# Patient Record
Sex: Female | Born: 1955
Health system: Southern US, Community
[De-identification: ages and names within clinical notes are randomized; demographics above are authoritative.]

## PROBLEM LIST (undated history)

## (undated) DIAGNOSIS — K219 Gastro-esophageal reflux disease without esophagitis: Secondary | ICD-10-CM

## (undated) DIAGNOSIS — B029 Zoster without complications: Secondary | ICD-10-CM

## (undated) DIAGNOSIS — T7840XA Allergy, unspecified, initial encounter: Secondary | ICD-10-CM

## (undated) DIAGNOSIS — E785 Hyperlipidemia, unspecified: Secondary | ICD-10-CM

## (undated) DIAGNOSIS — I10 Essential (primary) hypertension: Secondary | ICD-10-CM

## (undated) DIAGNOSIS — M1990 Unspecified osteoarthritis, unspecified site: Secondary | ICD-10-CM

## (undated) DIAGNOSIS — K635 Polyp of colon: Secondary | ICD-10-CM

## (undated) HISTORY — DX: Allergy, unspecified, initial encounter: T78.40XA

## (undated) HISTORY — PX: ABDOMINAL HYSTERECTOMY: SHX81

## (undated) HISTORY — DX: Zoster without complications: B02.9

## (undated) HISTORY — DX: Gastro-esophageal reflux disease without esophagitis: K21.9

## (undated) HISTORY — PX: VULVECTOMY: SHX1086

## (undated) HISTORY — DX: Polyp of colon: K63.5

## (undated) HISTORY — DX: Hyperlipidemia, unspecified: E78.5

## (undated) HISTORY — PX: COLONOSCOPY: SHX174

## (undated) HISTORY — DX: Unspecified osteoarthritis, unspecified site: M19.90

## (undated) HISTORY — PX: POLYPECTOMY: SHX149

## (undated) HISTORY — DX: Essential (primary) hypertension: I10

## (undated) HISTORY — PX: LABIOPLASTY: SHX1900

---

## 1999-06-26 ENCOUNTER — Inpatient Hospital Stay (HOSPITAL_COMMUNITY): Admission: EM | Admit: 1999-06-26 | Discharge: 1999-06-27 | Payer: Self-pay | Admitting: Internal Medicine

## 1999-07-15 ENCOUNTER — Ambulatory Visit (HOSPITAL_COMMUNITY): Admission: RE | Admit: 1999-07-15 | Discharge: 1999-07-15 | Payer: Self-pay | Admitting: Internal Medicine

## 1999-07-15 ENCOUNTER — Encounter: Payer: Self-pay | Admitting: Internal Medicine

## 1999-10-26 ENCOUNTER — Other Ambulatory Visit: Admission: RE | Admit: 1999-10-26 | Discharge: 1999-10-26 | Payer: Self-pay | Admitting: *Deleted

## 2000-06-23 ENCOUNTER — Emergency Department (HOSPITAL_COMMUNITY): Admission: EM | Admit: 2000-06-23 | Discharge: 2000-06-23 | Payer: Self-pay | Admitting: Emergency Medicine

## 2000-06-23 ENCOUNTER — Encounter: Payer: Self-pay | Admitting: Emergency Medicine

## 2000-10-25 ENCOUNTER — Other Ambulatory Visit: Admission: RE | Admit: 2000-10-25 | Discharge: 2000-10-25 | Payer: Self-pay | Admitting: *Deleted

## 2001-10-30 ENCOUNTER — Other Ambulatory Visit: Admission: RE | Admit: 2001-10-30 | Discharge: 2001-10-30 | Payer: Self-pay | Admitting: *Deleted

## 2002-01-14 ENCOUNTER — Emergency Department (HOSPITAL_COMMUNITY): Admission: EM | Admit: 2002-01-14 | Discharge: 2002-01-14 | Payer: Self-pay | Admitting: Emergency Medicine

## 2002-01-29 ENCOUNTER — Ambulatory Visit (HOSPITAL_BASED_OUTPATIENT_CLINIC_OR_DEPARTMENT_OTHER): Admission: RE | Admit: 2002-01-29 | Discharge: 2002-01-29 | Payer: Self-pay | Admitting: Urology

## 2002-01-29 ENCOUNTER — Encounter: Payer: Self-pay | Admitting: Urology

## 2003-04-01 ENCOUNTER — Other Ambulatory Visit: Admission: RE | Admit: 2003-04-01 | Discharge: 2003-04-01 | Payer: Self-pay | Admitting: *Deleted

## 2004-09-28 ENCOUNTER — Ambulatory Visit: Payer: Self-pay | Admitting: Internal Medicine

## 2004-09-30 ENCOUNTER — Ambulatory Visit: Payer: Self-pay | Admitting: Internal Medicine

## 2004-10-02 ENCOUNTER — Ambulatory Visit: Payer: Self-pay | Admitting: Internal Medicine

## 2004-10-07 ENCOUNTER — Other Ambulatory Visit: Admission: RE | Admit: 2004-10-07 | Discharge: 2004-10-07 | Payer: Self-pay | Admitting: *Deleted

## 2004-10-22 ENCOUNTER — Ambulatory Visit: Payer: Self-pay | Admitting: Internal Medicine

## 2005-04-30 ENCOUNTER — Ambulatory Visit: Payer: Self-pay | Admitting: Internal Medicine

## 2005-08-18 ENCOUNTER — Ambulatory Visit: Payer: Self-pay | Admitting: Internal Medicine

## 2005-11-03 ENCOUNTER — Other Ambulatory Visit: Admission: RE | Admit: 2005-11-03 | Discharge: 2005-11-03 | Payer: Self-pay | Admitting: *Deleted

## 2006-01-10 ENCOUNTER — Encounter: Admission: RE | Admit: 2006-01-10 | Discharge: 2006-01-10 | Payer: Self-pay | Admitting: *Deleted

## 2006-01-10 IMAGING — MG MM SCREEN MAMMOGRAM BILATERAL
4 series · 4 of 4 positions shown · non-contrast
Comparison: The breast tissue is almost entirely fatty.

DG SCREEN MAMMOGRAM BILATERAL
Bilateral CC and MLO view(s) were taken.

SCREENING MAMMOGRAM:

[R CC]
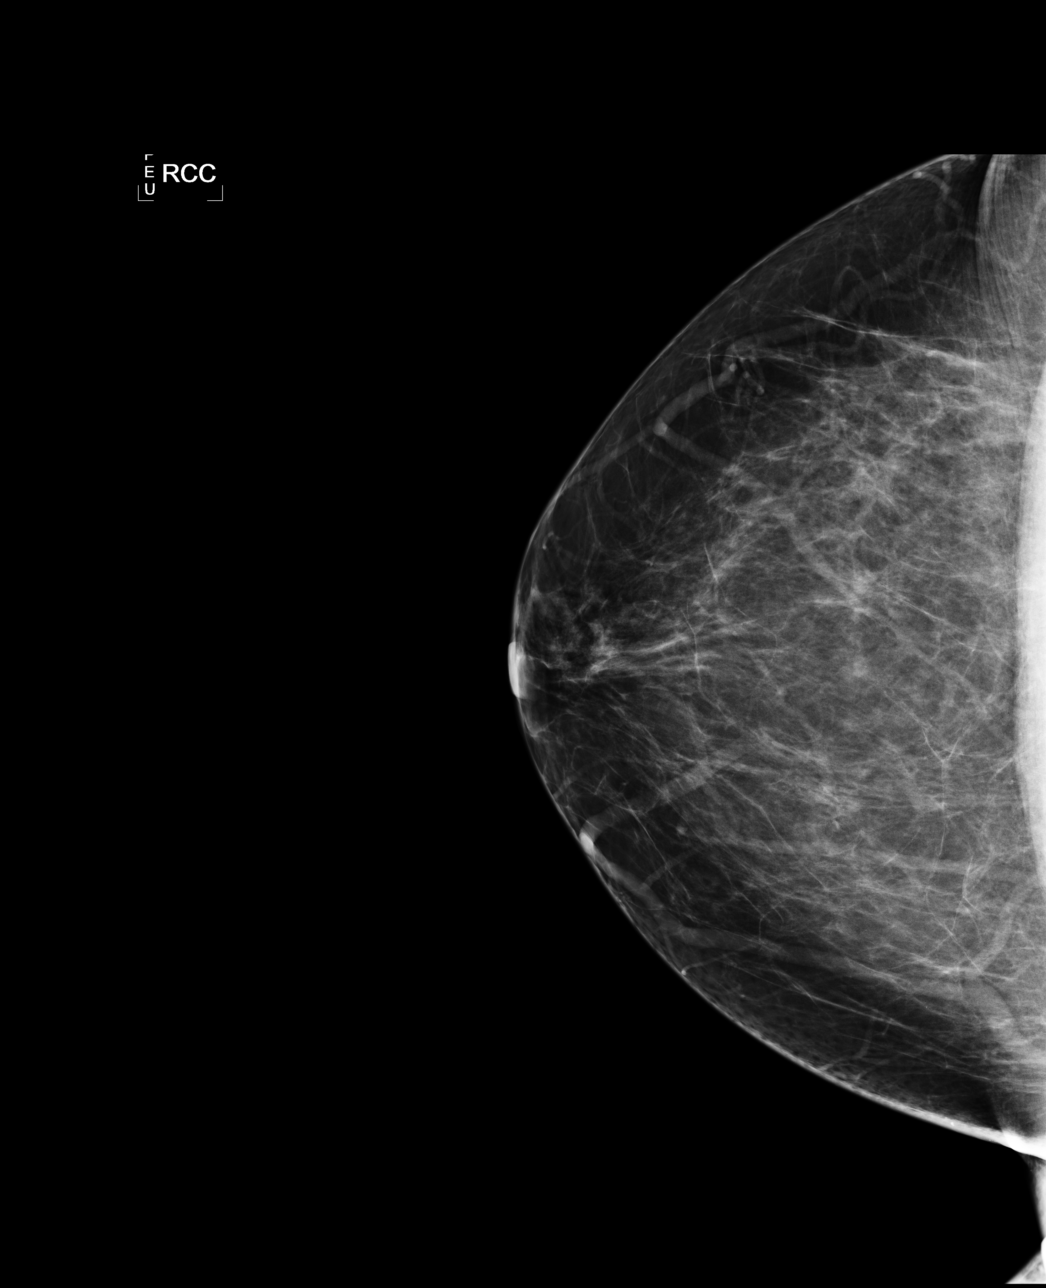

[L CC]
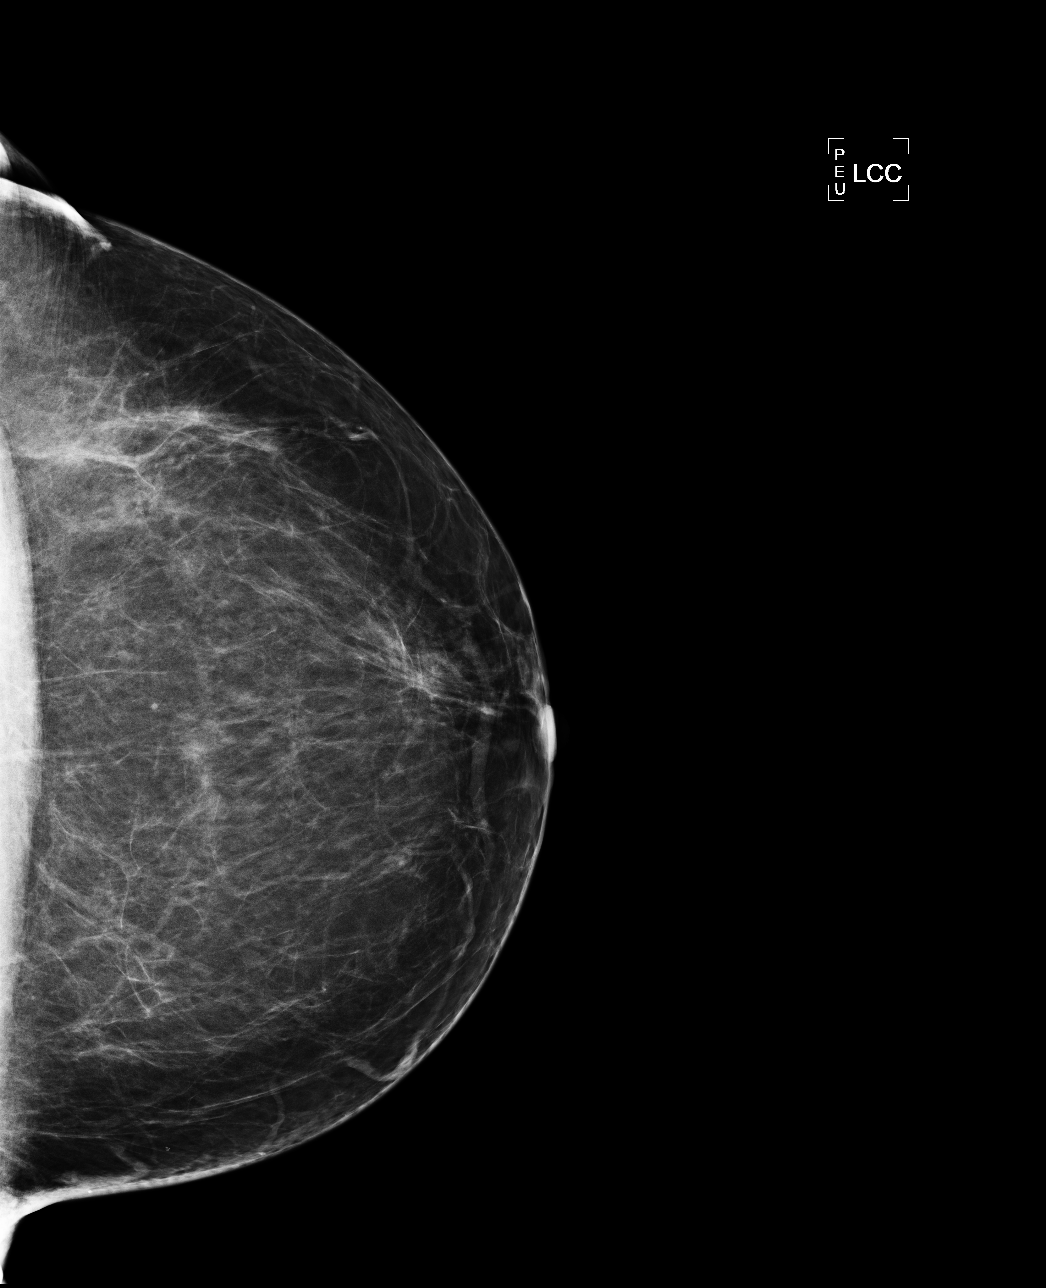

[L MLO]
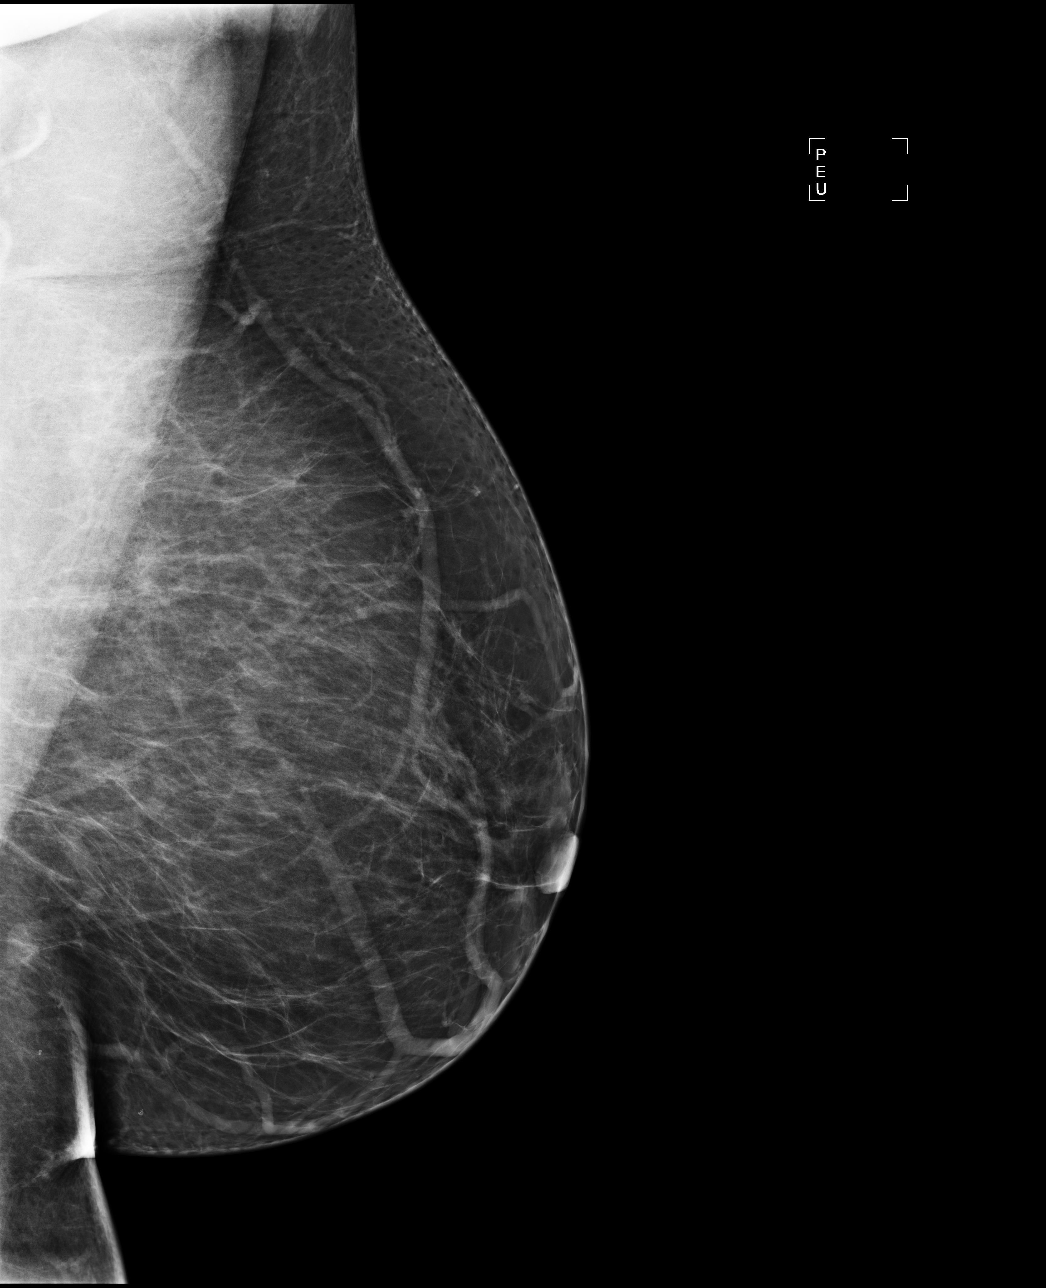

[R MLO]
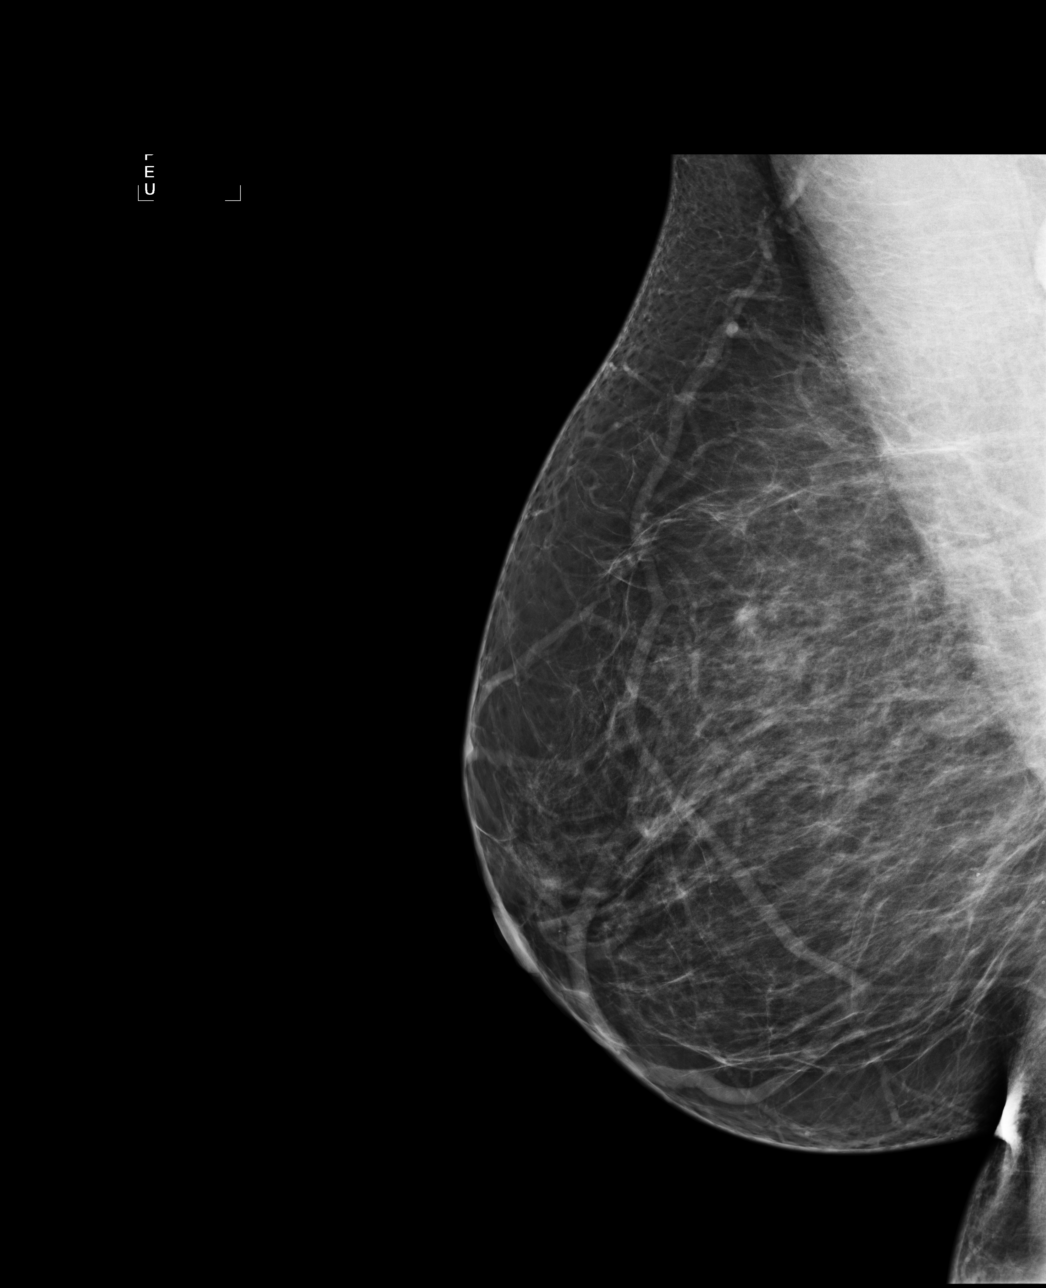

[4 of 4 positions shown; findings below may reference images not displayed]

A possible mass is noted in the right breast. Spot 
compression views and possibly sonography are recommended for further evaluation.  The left breast 
is unremarkable.  Compared with prior studies dated [DATE] from [HOSPITAL].
IMPRESSION: Possible mass, right breast.  Additional evaluation is indicated.  The patient will be contacted 
for additional studies and a supplemental report will follow.

ASSESSMENT: Need additional imaging evaluation and/or prior mammograms for comparison - BI-RADS 0 -
Right

Further imaging of the right breast.
ANALYZED BY COMPUTER AIDED DETECTION. , THIS PROCEDURE WAS A DIGITAL MAMMOGRAM.

## 2006-01-12 ENCOUNTER — Encounter: Admission: RE | Admit: 2006-01-12 | Discharge: 2006-01-12 | Payer: Self-pay | Admitting: *Deleted

## 2006-07-05 ENCOUNTER — Ambulatory Visit: Payer: Self-pay | Admitting: Internal Medicine

## 2006-07-05 LAB — CONVERTED CEMR LAB
BUN: 7 mg/dL (ref 6–23)
Basophils Relative: 0.3 % (ref 0.0–1.0)
Bilirubin Urine: NEGATIVE
Cholesterol: 245 mg/dL (ref 0–200)
Creatinine, Ser: 1 mg/dL (ref 0.4–1.2)
Glomerular Filtration Rate, Af Am: 75 mL/min/{1.73_m2}
HCT: 39.5 % (ref 36.0–46.0)
Hemoglobin, Urine: NEGATIVE
LDL DIRECT: 155.5 mg/dL
Leukocytes, UA: NEGATIVE
Monocytes Relative: 6.3 % (ref 3.0–11.0)
Neutrophils Relative %: 58.6 % (ref 43.0–77.0)
Nitrite: NEGATIVE
RDW: 12.8 % (ref 11.5–14.6)
Sodium: 140 meq/L (ref 135–145)
Specific Gravity, Urine: 1.01 (ref 1.000–1.03)
TSH: 1.19 microintl units/mL (ref 0.35–5.50)
Total Protein: 6.6 g/dL (ref 6.0–8.3)
Urine Glucose: NEGATIVE mg/dL
Urobilinogen, UA: 0.2 (ref 0.0–1.0)
VLDL: 30 mg/dL (ref 0–40)
WBC: 7.4 10*3/uL (ref 4.5–10.5)

## 2006-07-11 ENCOUNTER — Ambulatory Visit: Payer: Self-pay | Admitting: Internal Medicine

## 2006-07-26 ENCOUNTER — Ambulatory Visit: Payer: Self-pay | Admitting: Licensed Clinical Social Worker

## 2006-08-02 ENCOUNTER — Ambulatory Visit: Payer: Self-pay | Admitting: Licensed Clinical Social Worker

## 2006-08-19 ENCOUNTER — Ambulatory Visit: Payer: Self-pay | Admitting: Internal Medicine

## 2007-02-03 ENCOUNTER — Encounter: Admission: RE | Admit: 2007-02-03 | Discharge: 2007-02-03 | Payer: Self-pay | Admitting: *Deleted

## 2007-05-18 ENCOUNTER — Encounter: Payer: Self-pay | Admitting: Internal Medicine

## 2007-05-18 DIAGNOSIS — K270 Acute peptic ulcer, site unspecified, with hemorrhage: Secondary | ICD-10-CM | POA: Insufficient documentation

## 2007-06-29 DIAGNOSIS — K635 Polyp of colon: Secondary | ICD-10-CM

## 2007-06-29 HISTORY — DX: Polyp of colon: K63.5

## 2007-09-27 LAB — CONVERTED CEMR LAB

## 2007-11-27 ENCOUNTER — Other Ambulatory Visit: Admission: RE | Admit: 2007-11-27 | Discharge: 2007-11-27 | Payer: Self-pay | Admitting: Gynecology

## 2008-02-16 ENCOUNTER — Ambulatory Visit: Payer: Self-pay | Admitting: Internal Medicine

## 2008-02-16 LAB — CONVERTED CEMR LAB
ALT: 41 units/L — ABNORMAL HIGH (ref 0–35)
AST: 28 units/L (ref 0–37)
BUN: 9 mg/dL (ref 6–23)
Basophils Absolute: 0 10*3/uL (ref 0.0–0.1)
Bilirubin Urine: NEGATIVE
Bilirubin, Direct: 0.2 mg/dL (ref 0.0–0.3)
Calcium: 9.2 mg/dL (ref 8.4–10.5)
Chloride: 104 meq/L (ref 96–112)
Cholesterol: 213 mg/dL (ref 0–200)
Creatinine, Ser: 0.9 mg/dL (ref 0.4–1.2)
Direct LDL: 148.4 mg/dL
Eosinophils Absolute: 0.1 10*3/uL (ref 0.0–0.7)
Eosinophils Relative: 2 % (ref 0.0–5.0)
GFR calc non Af Amer: 70 mL/min
Glucose, Bld: 98 mg/dL (ref 70–99)
Hemoglobin, Urine: NEGATIVE
Hemoglobin: 12.9 g/dL (ref 12.0–15.0)
Ketones, ur: NEGATIVE mg/dL
Lymphocytes Relative: 37.2 % (ref 12.0–46.0)
MCHC: 34.3 g/dL (ref 30.0–36.0)
Monocytes Relative: 4.9 % (ref 3.0–12.0)
Nitrite: NEGATIVE
RBC: 4.02 M/uL (ref 3.87–5.11)
Total CHOL/HDL Ratio: 4.9
Total Protein, Urine: NEGATIVE mg/dL
Total Protein: 6.3 g/dL (ref 6.0–8.3)
Triglycerides: 112 mg/dL (ref 0–149)
Urine Glucose: NEGATIVE mg/dL

## 2008-02-19 ENCOUNTER — Encounter: Admission: RE | Admit: 2008-02-19 | Discharge: 2008-02-19 | Payer: Self-pay | Admitting: Gynecology

## 2008-02-22 ENCOUNTER — Ambulatory Visit: Payer: Self-pay | Admitting: Internal Medicine

## 2008-02-22 DIAGNOSIS — I10 Essential (primary) hypertension: Secondary | ICD-10-CM | POA: Insufficient documentation

## 2008-02-22 DIAGNOSIS — J019 Acute sinusitis, unspecified: Secondary | ICD-10-CM | POA: Insufficient documentation

## 2008-02-22 DIAGNOSIS — E1159 Type 2 diabetes mellitus with other circulatory complications: Secondary | ICD-10-CM | POA: Insufficient documentation

## 2008-02-22 DIAGNOSIS — F4321 Adjustment disorder with depressed mood: Secondary | ICD-10-CM | POA: Insufficient documentation

## 2008-02-22 DIAGNOSIS — E1169 Type 2 diabetes mellitus with other specified complication: Secondary | ICD-10-CM | POA: Insufficient documentation

## 2008-02-22 DIAGNOSIS — K219 Gastro-esophageal reflux disease without esophagitis: Secondary | ICD-10-CM | POA: Insufficient documentation

## 2008-02-22 DIAGNOSIS — E785 Hyperlipidemia, unspecified: Secondary | ICD-10-CM | POA: Insufficient documentation

## 2008-02-23 ENCOUNTER — Encounter: Admission: RE | Admit: 2008-02-23 | Discharge: 2008-02-23 | Payer: Self-pay | Admitting: Gynecology

## 2008-03-01 ENCOUNTER — Telehealth (INDEPENDENT_AMBULATORY_CARE_PROVIDER_SITE_OTHER): Payer: Self-pay | Admitting: *Deleted

## 2008-03-11 ENCOUNTER — Ambulatory Visit: Payer: Self-pay | Admitting: Internal Medicine

## 2008-03-13 ENCOUNTER — Telehealth: Payer: Self-pay | Admitting: Internal Medicine

## 2008-03-19 ENCOUNTER — Telehealth: Payer: Self-pay | Admitting: Internal Medicine

## 2008-03-20 ENCOUNTER — Ambulatory Visit: Payer: Self-pay | Admitting: Internal Medicine

## 2008-03-20 DIAGNOSIS — L989 Disorder of the skin and subcutaneous tissue, unspecified: Secondary | ICD-10-CM | POA: Insufficient documentation

## 2008-06-14 ENCOUNTER — Telehealth: Payer: Self-pay | Admitting: Internal Medicine

## 2008-06-19 ENCOUNTER — Ambulatory Visit: Payer: Self-pay | Admitting: Internal Medicine

## 2008-06-24 ENCOUNTER — Ambulatory Visit: Payer: Self-pay | Admitting: Internal Medicine

## 2008-07-04 ENCOUNTER — Encounter: Payer: Self-pay | Admitting: Internal Medicine

## 2008-07-09 ENCOUNTER — Telehealth: Payer: Self-pay | Admitting: Internal Medicine

## 2008-08-20 ENCOUNTER — Telehealth: Payer: Self-pay | Admitting: Internal Medicine

## 2008-08-21 ENCOUNTER — Telehealth: Payer: Self-pay | Admitting: Internal Medicine

## 2008-08-22 ENCOUNTER — Ambulatory Visit: Payer: Self-pay | Admitting: Internal Medicine

## 2008-08-22 DIAGNOSIS — R071 Chest pain on breathing: Secondary | ICD-10-CM | POA: Insufficient documentation

## 2008-08-22 DIAGNOSIS — J209 Acute bronchitis, unspecified: Secondary | ICD-10-CM | POA: Insufficient documentation

## 2008-08-22 IMAGING — CR DG CHEST 2V
2 series · 2 of 2 positions shown · non-contrast
Comparison: [DATE].

CLINICAL DATA: Chest pain and cough.  Short of breath.

CHEST - 2 VIEW

[view not recorded (1 of 2)]
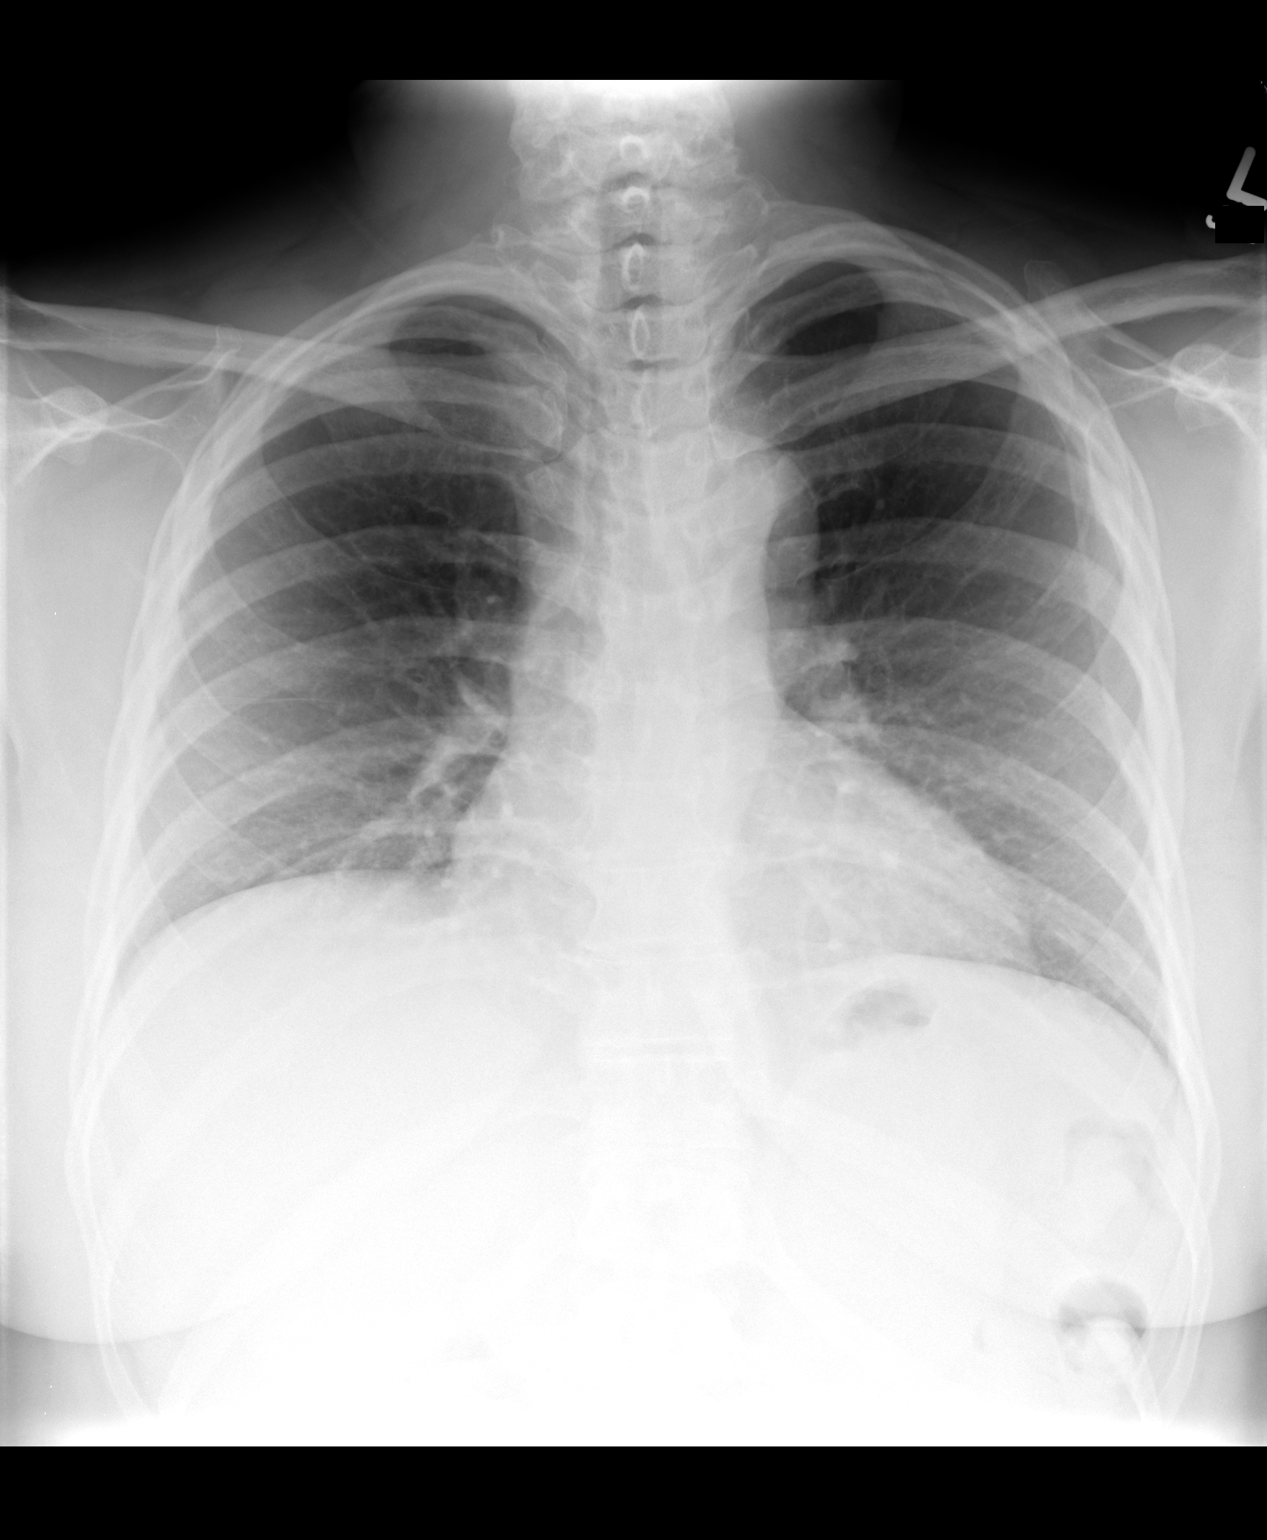

[view not recorded (2 of 2)]
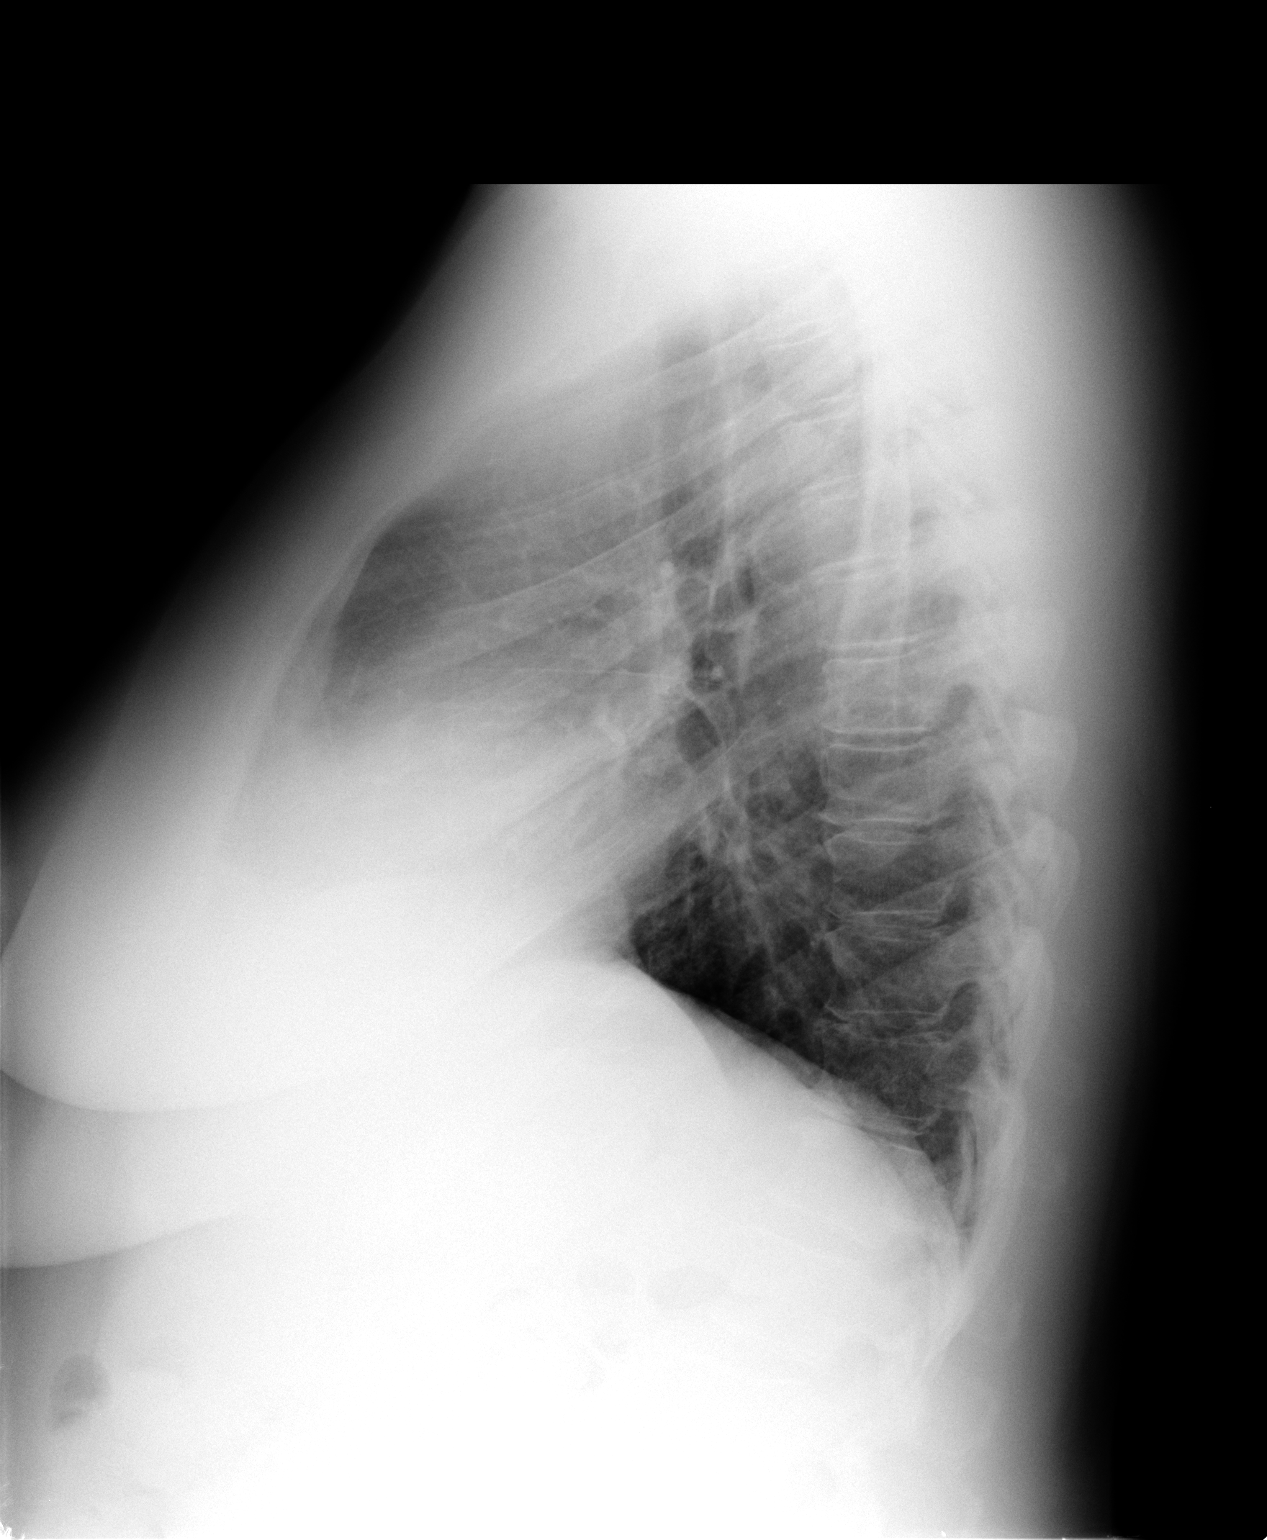

[2 of 2 positions shown; findings below may reference images not displayed]

FINDINGS: The heart is mildly enlarged.  There is no heart failure.
Decreased lung volume with minimal right lower lobe atelectasis.
No acute infiltrate or effusion.
IMPRESSION: Hypoventilation.  No active cardiopulmonary disease.

## 2008-08-29 ENCOUNTER — Telehealth: Payer: Self-pay | Admitting: Internal Medicine

## 2008-08-30 ENCOUNTER — Telehealth: Payer: Self-pay | Admitting: Internal Medicine

## 2008-09-03 ENCOUNTER — Telehealth: Payer: Self-pay | Admitting: Internal Medicine

## 2009-03-06 ENCOUNTER — Encounter: Admission: RE | Admit: 2009-03-06 | Discharge: 2009-03-06 | Payer: Self-pay | Admitting: Gynecology

## 2009-03-11 ENCOUNTER — Encounter: Admission: RE | Admit: 2009-03-11 | Discharge: 2009-03-11 | Payer: Self-pay | Admitting: Gynecology

## 2009-03-17 ENCOUNTER — Ambulatory Visit: Payer: Self-pay | Admitting: Internal Medicine

## 2009-03-17 DIAGNOSIS — R079 Chest pain, unspecified: Secondary | ICD-10-CM | POA: Insufficient documentation

## 2009-03-24 ENCOUNTER — Telehealth (INDEPENDENT_AMBULATORY_CARE_PROVIDER_SITE_OTHER): Payer: Self-pay | Admitting: *Deleted

## 2009-03-25 ENCOUNTER — Encounter: Payer: Self-pay | Admitting: Cardiology

## 2009-03-25 ENCOUNTER — Ambulatory Visit: Payer: Self-pay

## 2009-03-27 ENCOUNTER — Telehealth: Payer: Self-pay | Admitting: Internal Medicine

## 2009-05-12 ENCOUNTER — Telehealth: Payer: Self-pay | Admitting: Internal Medicine

## 2009-05-14 ENCOUNTER — Ambulatory Visit: Payer: Self-pay | Admitting: Internal Medicine

## 2009-09-17 ENCOUNTER — Encounter: Admission: RE | Admit: 2009-09-17 | Discharge: 2009-09-17 | Payer: Self-pay | Admitting: Obstetrics and Gynecology

## 2010-01-26 ENCOUNTER — Ambulatory Visit: Payer: Self-pay | Admitting: Internal Medicine

## 2010-03-09 ENCOUNTER — Encounter: Admission: RE | Admit: 2010-03-09 | Discharge: 2010-03-09 | Payer: Self-pay | Admitting: Gynecology

## 2010-03-18 ENCOUNTER — Ambulatory Visit: Payer: Self-pay | Admitting: Gynecology

## 2010-03-18 ENCOUNTER — Other Ambulatory Visit: Admission: RE | Admit: 2010-03-18 | Discharge: 2010-03-18 | Payer: Self-pay | Admitting: Gynecology

## 2010-03-24 ENCOUNTER — Ambulatory Visit: Payer: Self-pay | Admitting: Gynecology

## 2010-03-31 ENCOUNTER — Ambulatory Visit: Payer: Self-pay | Admitting: Gynecology

## 2010-04-01 ENCOUNTER — Encounter: Payer: Self-pay | Admitting: Internal Medicine

## 2010-05-08 ENCOUNTER — Ambulatory Visit (HOSPITAL_BASED_OUTPATIENT_CLINIC_OR_DEPARTMENT_OTHER): Admission: RE | Admit: 2010-05-08 | Discharge: 2010-05-09 | Payer: Self-pay | Admitting: Urology

## 2010-05-08 HISTORY — PX: PUBOVAGINAL SLING: SHX1035

## 2010-07-19 ENCOUNTER — Encounter: Payer: Self-pay | Admitting: *Deleted

## 2010-07-23 ENCOUNTER — Ambulatory Visit
Admission: RE | Admit: 2010-07-23 | Discharge: 2010-07-23 | Payer: Self-pay | Source: Home / Self Care | Attending: Internal Medicine | Admitting: Internal Medicine

## 2010-07-23 ENCOUNTER — Other Ambulatory Visit: Payer: Self-pay | Admitting: Internal Medicine

## 2010-07-23 LAB — CBC WITH DIFFERENTIAL/PLATELET
Basophils Absolute: 0 10*3/uL (ref 0.0–0.1)
Basophils Relative: 0.7 % (ref 0.0–3.0)
Eosinophils Relative: 1.6 % (ref 0.0–5.0)
Hemoglobin: 13.4 g/dL (ref 12.0–15.0)
Lymphs Abs: 2.8 10*3/uL (ref 0.7–4.0)
MCV: 93.1 fl (ref 78.0–100.0)
Monocytes Relative: 7 % (ref 3.0–12.0)
Neutrophils Relative %: 52.1 % (ref 43.0–77.0)
RDW: 13.7 % (ref 11.5–14.6)

## 2010-07-23 LAB — BASIC METABOLIC PANEL
BUN: 13 mg/dL (ref 6–23)
Creatinine, Ser: 1 mg/dL (ref 0.4–1.2)
Glucose, Bld: 92 mg/dL (ref 70–99)
Potassium: 4.4 mEq/L (ref 3.5–5.1)
Sodium: 138 mEq/L (ref 135–145)

## 2010-07-23 LAB — HEPATIC FUNCTION PANEL
ALT: 39 U/L — ABNORMAL HIGH (ref 0–35)
AST: 25 U/L (ref 0–37)
Albumin: 4 g/dL (ref 3.5–5.2)

## 2010-07-23 LAB — URINALYSIS
Bilirubin Urine: NEGATIVE
Ketones, ur: NEGATIVE
Leukocytes, UA: NEGATIVE
Nitrite: NEGATIVE
Total Protein, Urine: NEGATIVE
Urobilinogen, UA: 0.2 (ref 0.0–1.0)
pH: 6 (ref 5.0–8.0)

## 2010-07-23 LAB — LIPID PANEL
Triglycerides: 125 mg/dL (ref 0.0–149.0)
VLDL: 25 mg/dL (ref 0.0–40.0)

## 2010-07-23 LAB — TSH: TSH: 1.51 u[IU]/mL (ref 0.35–5.50)

## 2010-07-28 NOTE — Assessment & Plan Note (Signed)
Summary: LEFT LEG SWELLING/?BUG BITE/CD   Vital Signs:  Patient profile:   55 year old female Height:      66 inches Weight:      216 pounds BMI:     34.99 O2 Sat:      99 % on Room air Temp:     97.8 degrees F oral Pulse rate:   80 / minute BP sitting:   144 / 74  (left arm) Cuff size:   large  Vitals Entered By: Ami Bullins CMA (January 26, 2010 1:32 PM)  O2 Flow:  Room air CC: pt here with c/o swollen left leg, possible "bug bite". Pt has experience nausea since saturday and states there is localized burning at the bite site/ ab Comments Pt states she is no longer taking Sertraline   Primary Care Provider:  Jacques Navy MD  CC:  pt here with c/o swollen left leg and possible "bug bite". Pt has experience nausea since saturday and states there is localized burning at the bite site/ ab.  History of Present Illness: Patient was at the beach. She noticed that she had a painful left thigh with some swelling. She also had some systemic symptoms of fatigue and nausea. She reports that symptoms lasted a couple of days. She denies fevers, headaches or rash on hands or feet. She does not recall any tick bites and she did not see a target type rash or lesion.   Current Medications (verified): 1)  Lisinopril-Hydrochlorothiazide 20-12.5 Mg Tabs (Lisinopril-Hydrochlorothiazide) .Marland Kitchen.. 1 By Mouth Once Daily 2)  Tylenol Allergy Sinus 2-30-500 Mg Tabs (Chlorphen-Pseudoephed-Apap) .... As Directed As Needed 3)  Caltrate 600+d 600-400 Mg-Unit Tabs (Calcium Carbonate-Vitamin D) .... Take 1 Tablet By Mouth Two Times A Day 4)  Vitamin E 400 Unit Caps (Vitamin E) .... Take 1 Tablet By Mouth Once A Day 5)  Multivitamins  Tabs (Multiple Vitamin) .... Take 1 Tablet By Mouth Once A Day 6)  Sertraline Hcl 50 Mg Tabs (Sertraline Hcl) .Marland Kitchen.. 1 By Mouth Once Daily 7)  Cardizem Cd 240 Mg Xr24h-Cap (Diltiazem Hcl Coated Beads) .... Take 1 Tablet By Mouth Once A Day 8)  Simvastatin 20 Mg Tabs (Simvastatin) .Marland Kitchen..  1 By Mouth Qpm For Cholesterol 9)  Transderm-Scop 1.5 Mg Pt72 (Scopolamine Base) .... Apply 1 Q 72 Hours  Allergies (verified): No Known Drug Allergies PMH-FH-SH reviewed-no changes except otherwise noted  Review of Systems  The patient denies anorexia, fever, weight loss, chest pain, syncope, dyspnea on exertion, prolonged cough, headaches, abdominal pain, muscle weakness, suspicious skin lesions, difficulty walking, and angioedema.    Physical Exam  General:  Heavy-set AA female in no distress Head:  Normocephalic and atraumatic without obvious abnormalities. No apparent alopecia or balding. Lungs:  normal respiratory effort and normal breath sounds.   Heart:  normal rate and regular rhythm.   Msk:  normal ROM, no joint tenderness, no joint swelling, and no joint warmth.   Pulses:  2+ radial Extremities:  No clubbing, cyanosis, edema, or deformity noted with normal full range of motion of all joints.   Neurologic:  alert & oriented X3, cranial nerves II-XII intact, strength normal in all extremities, and gait normal.   Skin:  posterior left thigh with 3cm area of swelling that is mild with a small area that could represent insect sting or bite.  Cervical Nodes:  no anterior cervical adenopathy and no posterior cervical adenopathy.   Psych:  Oriented X3, memory intact for recent and remote,  normally interactive, and good eye contact.     Impression & Recommendations:  Problem # 1:  SKIN LESION (ICD-709.9) Patient with possible envenomation from bite on the posterior left thigh. No history signs or symptoms to suggest tick related disease.  Plan - symptoms are improving. No need for testing or intervention  Complete Medication List: 1)  Lisinopril-hydrochlorothiazide 20-12.5 Mg Tabs (Lisinopril-hydrochlorothiazide) .Marland Kitchen.. 1 by mouth once daily 2)  Tylenol Allergy Sinus 2-30-500 Mg Tabs (Chlorphen-pseudoephed-apap) .... As directed as needed 3)  Caltrate 600+d 600-400 Mg-unit Tabs  (Calcium carbonate-vitamin d) .... Take 1 tablet by mouth two times a day 4)  Vitamin E 400 Unit Caps (Vitamin e) .... Take 1 tablet by mouth once a day 5)  Multivitamins Tabs (Multiple vitamin) .... Take 1 tablet by mouth once a day 6)  Sertraline Hcl 50 Mg Tabs (Sertraline hcl) .Marland Kitchen.. 1 by mouth once daily 7)  Cardizem Cd 240 Mg Xr24h-cap (Diltiazem hcl coated beads) .... Take 1 tablet by mouth once a day 8)  Simvastatin 20 Mg Tabs (Simvastatin) .Marland Kitchen.. 1 by mouth qpm for cholesterol 9)  Transderm-scop 1.5 Mg Pt72 (Scopolamine base) .... Apply 1 q 72 hours 10)  Meloxicam 15 Mg Tabs (Meloxicam) .Marland Kitchen.. 1 by mouth once daily Prescriptions: MELOXICAM 15 MG TABS (MELOXICAM) 1 by mouth once daily  #30 x 3   Entered and Authorized by:   Jacques Navy MD   Signed by:   Jacques Navy MD on 01/27/2010   Method used:   Electronically to        [id number] (retail)       [address]       [place], [place]  [address]       Ph: [phone] or [phone]       Fax: [phone]   RxID:   [id number]

## 2010-07-29 ENCOUNTER — Other Ambulatory Visit: Payer: Self-pay

## 2010-07-31 ENCOUNTER — Encounter: Payer: Self-pay | Admitting: Internal Medicine

## 2010-07-31 ENCOUNTER — Encounter (INDEPENDENT_AMBULATORY_CARE_PROVIDER_SITE_OTHER): Payer: BC Managed Care – PPO | Admitting: Internal Medicine

## 2010-07-31 DIAGNOSIS — Z0000 Encounter for general adult medical examination without abnormal findings: Secondary | ICD-10-CM

## 2010-08-06 ENCOUNTER — Telehealth: Payer: Self-pay | Admitting: Internal Medicine

## 2010-08-13 NOTE — Assessment & Plan Note (Signed)
Summary: CPX-LB   Vital Signs:  Patient profile:   55 year old female Height:      66 inches Weight:      214 pounds BMI:     34.67 O2 Sat:      99 % on Room air Temp:     97.9 degrees F oral Pulse rate:   98 / minute BP sitting:   118 / 72  (left arm) Cuff size:   large  Vitals Entered By: Bill Salinas CMA (July 31, 2010 1:52 PM)  O2 Flow:  Room air CC: pt here for cpx/ ab Comments Pt no longer taking sertraline or using transderm patch/ ab   Primary Care Provider:  Jacques Navy MD  CC:  pt here for cpx/ ab.  History of Present Illness: patient presents for annual exam.  Interval history notable for having sprained her right ankle. She also had tingling and burning back and chest. She has seen Dr. Jillyn Hidden who was concerned for possible sciatica but her exam was not convincing and that the problem was all from the sprained ankle.   She is current with her gynecologist, Dr. Lily Peer, in the fall '11 with a normal exam. She had a mammogram in August of '11.   She has been feeling pretty good.    Current Medications (verified): 1)  Lisinopril-Hydrochlorothiazide 20-12.5 Mg Tabs (Lisinopril-Hydrochlorothiazide) .Marland Kitchen.. 1 By Mouth Once Daily 2)  Tylenol Allergy Sinus 2-30-500 Mg Tabs (Chlorphen-Pseudoephed-Apap) .... As Directed As Needed 3)  Caltrate 600+d 600-400 Mg-Unit Tabs (Calcium Carbonate-Vitamin D) .... Take 1 Tablet By Mouth Two Times A Day 4)  Vitamin E 400 Unit Caps (Vitamin E) .... Take 1 Tablet By Mouth Once A Day 5)  Multivitamins  Tabs (Multiple Vitamin) .... Take 1 Tablet By Mouth Once A Day 6)  Sertraline Hcl 50 Mg Tabs (Sertraline Hcl) .Marland Kitchen.. 1 By Mouth Once Daily 7)  Cardizem Cd 240 Mg Xr24h-Cap (Diltiazem Hcl Coated Beads) .... Take 1 Tablet By Mouth Once A Day 8)  Simvastatin 20 Mg Tabs (Simvastatin) .Marland Kitchen.. 1 By Mouth Qpm For Cholesterol 9)  Transderm-Scop 1.5 Mg Pt72 (Scopolamine Base) .... Apply 1 Q 72 Hours 10)  Meloxicam 15 Mg Tabs (Meloxicam) .Marland Kitchen.. 1 By  Mouth Once Daily  Allergies (verified): No Known Drug Allergies  Past History:  Past Medical History: Last updated: 02/22/2008 1. Usual childhood disease. 2. Left ovarian cyst with spontaneous rupture. 3. History of GI bleed in 14-Nov-1991. 4. Colon polyps on colonoscopy in Nov 14, 1991. 5. GYN - the patient is a gravida 2, para 2.   Past Surgical History: Last updated: 02/22/2008 1. Tonsillectomy in Nov 14, 1982. 2. Tubal ligation in Nov 14, 1983. 3. Hysterectomy in 1984-11-13 secondary to fibroid tumors. 4. Cyst removed from her left arm in 1987. 5. Kidney stone retrieval through ureteroscopy in 1987. 6. Sinus surgery in 11-13-96.   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  The patient denies anorexia, fever, weight loss, weight gain, decreased hearing, chest pain, syncope, dyspnea on exertion, prolonged cough, headaches, abdominal pain, severe indigestion/heartburn, incontinence,  muscle weakness, suspicious skin lesions, depression, abnormal bleeding, enlarged lymph nodes, angioedema, and breast masses.         difficulty walking due to ankle sprain.  Physical Exam  General:  overweight AA woman in no acute distress. Does limp and favor her dmaged ankle Head:  normocephalic and atraumatic.   Eyes:  vision grossly intact, pupils equal, and pupils round. diffiuclty visualizing left fundi  Ears:  R ear normal and L ear normal.   Nose:  no external deformity, no external erythema, and no nasal discharge.   Mouth:  several teeth missing. No gingivitis.  Posterior pharynx normal. No buccal lesions Neck:  supple, full ROM, no thyromegaly, and no carotid bruits.   Chest Wall:  No deformities, masses, or tenderness noted. Lungs:  Normal respiratory effort, chest expands symmetrically. Lungs are clear to auscultation, no crackles or wheezes. Heart:  Normal rate and regular rhythm. S1 and S2 normal without gallop, murmur, click, rub or other extra sounds. Abdomen:  obese, soft, non-tender, and no guarding.   Msk:  brce on right ankle. no joint tenderness, no joint swelling, and no redness over joints.   Pulses:  2+radial Extremities:  trace LE edema Neurologic:  alert & oriented X3, cranial nerves II-XII intact, strength normal in all extremities, and gait normal.   Skin:  turgor normal, color normal, and no ulcerations.   Cervical Nodes:  no anterior cervical adenopathy and no posterior cervical adenopathy.   Psych:  Oriented X3, memory intact for recent and remote, normally interactive, good eye contact, and not anxious appearing.     Impression & Recommendations:  Problem # 1:  GERD (ICD-530.81) Reasonably well controlled  Problem # 2:  HYPERLIPIDEMIA (ICD-272.4) Suboptimal control with LDL of 160.3. HDL is greaat  Plan - increase simvastatin to 40mg  daily           recheck lab in 3-4 weeks.   Her updated medication list for this problem includes:    Simvastatin 20 Mg Tabs (Simvastatin) .Marland Kitchen... 1 by mouth qpm for cholesterol  Problem # 3:  HYPERTENSION (ICD-401.9)  Her updated medication list for this problem includes:    Lisinopril-hydrochlorothiazide 20-12.5 Mg Tabs (Lisinopril-hydrochlorothiazide) .Marland Kitchen... 1 by mouth once daily    Cardizem Cd 240 Mg Xr24h-cap (Diltiazem hcl coated beads) .Marland Kitchen... Take 1 tablet by mouth once a day  BP today: 118/72 Prior BP: 144/74 (01/26/2010)  Labs Reviewed: K+: 4.4 (07/23/2010) Creat: : 1.0 (07/23/2010)   Very good control of Blood pressure on present regimen.  Problem # 4:  Preventive Health  Care (ICD-V70.0) Except for ankle injury the interval history is stable. Her limited exam is remarkable for being overweight - discussed with her as THE greatest risk to her health and strongly recommended a weight management program (no excuses) such as Weight Watchers with smart food choice, PORTION SIZE control and exercise. She needs to loose 50lbs with a goal of loosing 1-2 lbs a month. She is current with her gynecologist. Lst mammogram  March '11. Last colonosopy Dec '09 with adenomatous polyp. Immunizations: pneumonia vaccine Nov '10. She did have a nuclear stress test Sept '10 that was normal.  In summary - a very nice woman who is medically stable but is recovering from  a grade II ankle sprain and nneds to put real energy into weight management.   Complete Medication List: 1)  Lisinopril-hydrochlorothiazide 20-12.5 Mg Tabs (Lisinopril-hydrochlorothiazide) .Marland Kitchen.. 1 by mouth once daily 2)  Tylenol Allergy Sinus 2-30-500 Mg Tabs (Chlorphen-pseudoephed-apap) .... As  directed as needed 3)  Caltrate 600+d 600-400 Mg-unit Tabs (Calcium carbonate-vitamin d) .... Take 1 tablet by mouth two times a day 4)  Vitamin E 400 Unit Caps (Vitamin e) .... Take 1 tablet by mouth once a day 5)  Multivitamins Tabs (Multiple vitamin) .... Take 1 tablet by mouth once a day 6)  Sertraline Hcl 50 Mg Tabs (Sertraline hcl) .Marland Kitchen.. 1 by mouth once daily 7)  Cardizem Cd 240 Mg Xr24h-cap (Diltiazem hcl coated beads) .... Take 1 tablet by mouth once a day 8)  Simvastatin 20 Mg Tabs (Simvastatin) .Marland Kitchen.. 1 by mouth qpm for cholesterol 9)  Transderm-scop 1.5 Mg Pt72 (Scopolamine base) .... Apply 1 q 72 hours 10)  Meloxicam 15 Mg Tabs (Meloxicam) .Marland Kitchen.. 1 by mouth once daily   Patient: Tami Hughes Note: All result statuses are Final unless otherwise noted.  Tests: (1) Lipid Panel (LIPID)   Cholesterol          [H]  241 mg/dL                   9-562     ATP III Classification            Desirable:  < 200 mg/dL                     Borderline High:  200 - 239 mg/dL               High:  > = 240 mg/dL   Triglycerides             125.0 mg/dL                 1.3-086.5     Normal:  <150 mg/dL     Borderline High:  784 - 199 mg/dL   HDL                       69.62 mg/dL                 >95.28   VLDL Cholesterol          25.0 mg/dL                  4.1-32.4  CHO/HDL Ratio:  CHD Risk                             4                    Men          Women     1/2 Average Risk     3.4          3.3     Average Risk          5.0          4.4     2X Average Risk          9.6          7.1     3X Average Risk          15.0          11.0                           Tests: (2) BMP (METABOL)   Sodium  138 mEq/L                   135-145   Potassium                 4.4 mEq/L                   3.5-5.1   Chloride                  101 mEq/L                   96-112   Carbon Dioxide            30 mEq/L                    19-32   Glucose                   92 mg/dL                    16-10   BUN                       13 mg/dL                    9-60   Creatinine                1.0 mg/dL                   4.5-4.0   Calcium                   9.3 mg/dL                   9.8-11.9   GFR                       71.42 mL/min                >60.00  Tests: (3) CBC Platelet w/Diff (CBCD)   White Cell Count          7.1 K/uL                    4.5-10.5   Red Cell Count            4.21 Mil/uL                 3.87-5.11   Hemoglobin                13.4 g/dL                   14.7-82.9   Hematocrit                39.2 %                      36.0-46.0   MCV                       93.1 fl                     78.0-100.0   MCHC                      34.2 g/dL  30.0-36.0   RDW                       13.7 %                      11.5-14.6   Platelet Count            237.0 K/uL                  150.0-400.0   Neutrophil %              52.1 %                      43.0-77.0   Lymphocyte %              38.6 %                       12.0-46.0   Monocyte %                7.0 %                       3.0-12.0   Eosinophils%              1.6 %                       0.0-5.0   Basophils %               0.7 %                       0.0-3.0   Neutrophill Absolute      3.7 K/uL                    1.4-7.7   Lymphocyte Absolute       2.8 K/uL                    0.7-4.0   Monocyte Absolute         0.5 K/uL                    0.1-1.0  Eosinophils, Absolute                             0.1 K/uL                    0.0-0.7   Basophils Absolute        0.0 K/uL                    0.0-0.1  Tests: (4) TSH (TSH)   FastTSH                   1.51 uIU/mL                 0.35-5.50  Tests: (5) Hepatic/Liver Function Panel (HEPATIC)   Total Bilirubin           0.6 mg/dL                   0.4-5.4   Direct Bilirubin          0.1 mg/dL                   0.9-8.1  Alkaline Phosphatase      80 U/L                      39-117   AST                       25 U/L                      0-37   ALT                  [H]  39 U/L                      0-35   Total Protein             6.8 g/dL                    6.2-1.3   Albumin                   4.0 g/dL                    0.8-6.5  Tests: (6) UDip Only (UDIP)   Color                     LT. YELLOW       RANGE:  Yellow;Lt. Yellow   Clarity                   CLEAR                       Clear   Specific Gravity          1.010                       1.000 - 1.030   Urine Ph                  6.0                         5.0-8.0   Protein                   NEGATIVE                    Negative   Urine Glucose             NEGATIVE                    Negative   Ketones                   NEGATIVE                    Negative   Urine Bilirubin           NEGATIVE                    Negative   Blood                     NEGATIVE                    Negative   Urobilinogen              0.2  0.0 - 1.0   Leukocyte Esterace        NEGATIVE                    Negative   Nitrite                    NEGATIVE                    Negative  Tests: (7) Cholesterol LDL - Direct (DIRLDL)  Cholesterol LDL - Direct                             160.3 mg/dL     Optimal:  <578 mg/dL     Near or Above Optimal:  100-129 mg/dL     Borderline High:  469-629 mg/dL     High:  528-413 mg/dL  Orders Added: 1)  Est. Patient 40-64 years [99396]     Preventive Care Screening  Pap Smear:    Date:  04/01/2010    Results:  normal   Mammogram:    Date:  08/31/2009    Results:  normal bilateral

## 2010-08-13 NOTE — Progress Notes (Signed)
Summary: rx  Phone Note Call from Patient Call back at Home Phone [phone]   Caller: Patient Summary of Call: Patient lmovm stating that  rx for cholesterol was discussed at last office visit. She checked with her pharmacy [facility] but not rx there. Please advise thanks.Alvy Beal Archie CMA  August 06, 2010 12:13 PM   Follow-up for Phone Call        yep - increase simvastatin to 40mg  once daily , #30, refill as needed  Follow-up by: Jacques Navy MD,  August 06, 2010 1:22 PM  Additional Follow-up for Phone Call Additional follow up Details #1::        Pt informed  Additional Follow-up by: Lamar Sprinkles, CMA,  August 06, 2010 2:13 PM    New/Updated Medications: SIMVASTATIN 40 MG TABS (SIMVASTATIN) 1 once daily Prescriptions: SIMVASTATIN 40 MG TABS (SIMVASTATIN) 1 once daily  #90 x 3   Entered by:   Lamar Sprinkles, CMA   Authorized by:   Jacques Navy MD   Signed by:   Lamar Sprinkles, CMA on 08/06/2010   Method used:   Electronically to        [facility] (retail)       [address]       [place], [place]  [address]       Ph: [phone]       Fax: [phone]   RxID:   [id number]

## 2010-08-24 ENCOUNTER — Telehealth: Payer: Self-pay | Admitting: Internal Medicine

## 2010-08-28 ENCOUNTER — Encounter: Payer: Self-pay | Admitting: Internal Medicine

## 2010-08-28 ENCOUNTER — Ambulatory Visit (INDEPENDENT_AMBULATORY_CARE_PROVIDER_SITE_OTHER): Payer: BC Managed Care – PPO | Admitting: Internal Medicine

## 2010-08-28 DIAGNOSIS — R209 Unspecified disturbances of skin sensation: Secondary | ICD-10-CM | POA: Insufficient documentation

## 2010-08-28 DIAGNOSIS — J209 Acute bronchitis, unspecified: Secondary | ICD-10-CM

## 2010-08-28 DIAGNOSIS — I872 Venous insufficiency (chronic) (peripheral): Secondary | ICD-10-CM

## 2010-08-28 DIAGNOSIS — K219 Gastro-esophageal reflux disease without esophagitis: Secondary | ICD-10-CM

## 2010-09-03 NOTE — Progress Notes (Signed)
Summary: Needs call back  Phone Note Call from Patient Call back at Home Phone [phone]   Summary of Call: Pt left vm - unsure what mess was b/c connection was not clear after she said her name and DOB.  Initial call taken by: Lamar Sprinkles, CMA,  August 24, 2010 5:51 PM  Follow-up for Phone Call        Returned call to patient who c/o bilateral foot and ankle swelling. She stated that she fell a cou-le weeks ago and has been treated by orth. She reports no pain, change in color of hotness in areas. She would like to know if MD has any suggestions for her.Alvy Beal Archie CMA  August 25, 2010 2:21 PM   Additional Follow-up for Phone Call Additional follow up Details #1::        May be venous insufficiency. Trial of knee high otc support hosiery - nurses stockings, on first thing in AM off when able to put feet up.  Additional Follow-up by: Jacques Navy MD,  August 25, 2010 5:46 PM    Additional Follow-up for Phone Call Additional follow up Details #2::    Pt informed  Follow-up by: Lamar Sprinkles, CMA,  August 25, 2010 6:37 PM

## 2010-09-08 NOTE — Assessment & Plan Note (Signed)
Summary: SWELLING IN ANKLES/NWS--PER [facility] PT APPT IS AT 115P---STC   Vital Signs:  Patient profile:   55 year old female Height:      66 inches (167.64 cm) Weight:      222.75 pounds (101.25 kg) BMI:     36.08 O2 Sat:      98 % on Room air Temp:     98.5 degrees F (36.94 degrees C) oral Pulse rate:   105 / minute Resp:     16 per minute BP sitting:   120 / 78  (left arm) Cuff size:   large  Vitals Entered By: Burnard Leigh CMA(AAMA) (August 28, 2010 1:19 PM)  O2 Flow:  Room air CC: Pty c/o swelling in lower legs x2 weeks.Pt c/o SOB w/pain & tightness on Left side of chest x3 days/sls,cma Is Patient Diabetic? No Comments Pt states she is no longer taking Sertraline and/or Transderm-SCOP   Primary Care Provider:  Jacques Navy MD  CC:  Pty c/o swelling in lower legs x2 weeks.Pt c/o SOB w/pain & tightness on Left side of chest x3 days/sls and cma.  History of Present Illness: Mrs. Knight presents c/o progressive LE edema that develops through the course of the day and resolves over night. She has seen a progression in her symptoms over the past several months.   She fell and had a sprained right ankle. She saw Dr. Jillyn Hidden and was diagnoses and put in a boot for several weeks. She now is having tingling that radiates from the posterior thigh to the ankle. She has weakness in the right leg as well.   She reports that she has increased heartburn/dyspepsia for which she takes zantac 150mg  in the evening. Her symptoms are mostly at night and does interfere with sleep. She reports here symptoms are getting worse.   She c/o bronchitis with cough that is productive of green mucuse with darker elements. No fever; has chills; does have dyspnea. She has tried mucinex DM. She does have hoarseness.    Current Medications (verified): 1)  Lisinopril-Hydrochlorothiazide 20-12.5 Mg Tabs (Lisinopril-Hydrochlorothiazide) .Marland Kitchen.. 1 By Mouth Once Daily 2)  Tylenol Allergy Sinus 2-30-500 Mg Tabs  (Chlorphen-Pseudoephed-Apap) .... As Directed As Needed 3)  Caltrate 600+d 600-400 Mg-Unit Tabs (Calcium Carbonate-Vitamin D) .... Take 1 Tablet By Mouth Two Times A Day 4)  Vitamin E 400 Unit Caps (Vitamin E) .... Take 1 Tablet By Mouth Once A Day 5)  Multivitamins  Tabs (Multiple Vitamin) .... Take 1 Tablet By Mouth Once A Day 6)  Sertraline Hcl 50 Mg Tabs (Sertraline Hcl) .Marland Kitchen.. 1 By Mouth Once Daily 7)  Cardizem Cd 240 Mg Xr24h-Cap (Diltiazem Hcl Coated Beads) .... Take 1 Tablet By Mouth Once A Day 8)  Simvastatin 40 Mg Tabs (Simvastatin) .Marland Kitchen.. 1 Once Daily 9)  Transderm-Scop 1.5 Mg Pt72 (Scopolamine Base) .... Apply 1 Q 72 Hours 10)  Meloxicam 15 Mg Tabs (Meloxicam) .Marland Kitchen.. 1 By Mouth Once Daily  Allergies (verified): No Known Drug Allergies  Past History:  Past Medical History: Last updated: 02/22/2008 1. Usual childhood disease. 2. Left ovarian cyst with spontaneous rupture. 3. History of GI bleed in 1993. 4. Colon polyps on colonoscopy in 1993. 5. GYN - the patient is a gravida 2, para 2.  Past Surgical History: Last updated: 02/22/2008 1. Tonsillectomy in 1984. 2. Tubal ligation in 1985. 3. Hysterectomy in 1986 secondary to fibroid tumors. 4. Cyst removed from her left arm in 1987. 5. Kidney stone retrieval through ureteroscopy in  1987. 6. Sinus surgery in November 27, 1996.  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 No apparent alopecia or balding. Eyes:  C&S  clear Mouth:  throat clear Neck:  No deformities, masses, or tenderness noted. Lungs:  Normal respiratory effort, chest expands symmetrically. Lungs are clear to auscultation, no crackles or wheezes. Heart:  normal rate and regular rhythm.   Abdomen:  BS +, no guarding or rebound or tenderness to deep palpation Msk:  back: nl stand, flex to 160 degrees, with toe walk mild weakness right; heel walk normal; step-up wiyth right leg OK and she could support her full weight on the right leg; nl DTR patellar; nl SLR sitting. Pulses:  2+ radial Neurologic:  alert & oriented X3, cranial nerves II-XII intact, gait normal, and DTRs symmetrical and normal.   Skin:  turgor normal and color normal.   Cervical Nodes:  no anterior cervical adenopathy and no posterior cervical adenopathy.   Psych:  Oriented X3, good eye contact, and not anxious appearing.     Impression & Recommendations:  Problem # 1:  BRONCHITIS, ACUTE (ICD-466.0) Pt wit productive cough and SOb c/w acute bronchitis.  Plan - z-pak           phen/cod 1 tsp q 6           supportive care  Her updated medication list for this problem includes:    Tylenol Allergy Sinus 2-30-500 Mg Tabs (Chlorphen-pseudoephed-apap) .Marland Kitchen... As directed as needed    Doxycycline Hyclate 100 Mg Caps (Doxycycline hyclate) .Marland Kitchen... 1 by mouth id for  bronchitis x 7 days    Promethazine-codeine 6.25-10 Mg/54ml Syrp (Promethazine-codeine) .Marland Kitchen... 1 tsp q6 for cough  Problem # 2:  GERD (ICD-530.81) Increased symptoms, no dysphagia or evidence of stricture.  Plan - increase prilosec to 40mg  qAM           add liquid antacid with anti-gas agent prn  Problem # 3:  VENOUS INSUFFICIENCY, LEGS (ICD-459.81) Reviewed the mechanism of the problem and discussed non-medical therapy  Plan - elevation of legs           otc support stockings.  Problem # 4:  PARESTHESIA (ICD-782.0) Non-radicular exam right LE.  PLAN -  watchful waiting.   Complete Medication List: 1)   Lisinopril-hydrochlorothiazide 20-12.5 Mg Tabs (Lisinopril-hydrochlorothiazide) .Marland Kitchen.. 1 by mouth once daily 2)  Tylenol Allergy Sinus 2-30-500 Mg Tabs (Chlorphen-pseudoephed-apap) .... As directed as needed 3)  Caltrate 600+d 600-400 Mg-unit Tabs (Calcium carbonate-vitamin d) .... Take 1 tablet by mouth two times a day 4)  Vitamin E 400 Unit Caps (Vitamin e) .... Take 1 tablet by mouth once a day 5)  Multivitamins Tabs (Multiple vitamin) .... Take 1 tablet by mouth once a day 6)  Sertraline Hcl 50 Mg Tabs (Sertraline hcl) .Marland Kitchen.. 1 by mouth once daily 7)  Cardizem Cd 240 Mg Xr24h-cap (Diltiazem hcl coated beads) .... Take 1 tablet by mouth once a day 8)  Simvastatin 40 Mg Tabs (Simvastatin) .Marland Kitchen.. 1 once daily 9)  Transderm-scop 1.5 Mg  Pt72 (Scopolamine base) .... Apply 1 q 72 hours 10)  Meloxicam 15 Mg Tabs (Meloxicam) .Marland Kitchen.. 1 by mouth once daily 11)  Doxycycline Hyclate 100 Mg Caps (Doxycycline hyclate) .Marland Kitchen.. 1 by mouth id for  bronchitis x 7 days 12)  Promethazine-codeine 6.25-10 Mg/21ml Syrp (Promethazine-codeine) .Marland Kitchen.. 1 tsp q6 for cough  Patient Instructions: 1)  1. Venous insufficiency - elevate legs during the day; use knee high or pantyhose support stockings - working girls. for severe problem can revere to vein clinic 2)  2. Bronchitis - most likely viral but will give z-pak and phenergan with codeine syrup 1 tsp q 6 3)  3. Reflux - take Prilosec OTC 20mg  - two capsules every morning. May take either Mylanta II (or the equivalent) or zantac at night if needed.  4)  4. Leg tingling - normal exam today with no evidence of a pinched nerve. If the problem gets worse will need to re-examine you. Prescriptions: PROMETHAZINE-CODEINE 6.25-10 MG/5ML SYRP (PROMETHAZINE-CODEINE) 1 tsp q6 for cough  #8 oz x 1   Entered and Authorized by:   Jacques Navy MD   Signed by:   Jacques Navy MD on 08/28/2010   Method used:   Handwritten   RxID:   [id number] DOXYCYCLINE HYCLATE 100 MG CAPS  (DOXYCYCLINE HYCLATE) 1 by mouth id for  bronchitis x 7 days  #14 x 0   Entered and Authorized by:   Jacques Navy MD   Signed by:   Jacques Navy MD on 08/28/2010   Method used:   Electronically to        [facility]  [id number] ([facility])       [address]       [place], [place]  [address]       Ph: [phone] or [phone]       Fax: [phone]   RxID:   [id number]    Orders Added: 1)  Est. Patient Level IV [87564]

## 2010-09-09 LAB — POCT I-STAT 4, (NA,K, GLUC, HGB,HCT)
Potassium: 3.6 mEq/L (ref 3.5–5.1)
Sodium: 139 mEq/L (ref 135–145)

## 2010-11-13 NOTE — Assessment & Plan Note (Signed)
[facility]                           PRIMARY CARE OFFICE NOTE   Tami, Hughes                       MRN:          [id number]  DATE:07/11/2006                            DOB:          1955-11-25    Tami Hughes is a 55 year old African-American woman who presents for  evaluation and exam.   CHIEF COMPLAINT:  The patient reports she developed a palpable vessel at  the left temple which was tender.  This is now smaller, less noticeable  to her and not painful.  The patient otherwise reports that she is  feeling well and doing well with no acute medical complaints.   PAST MEDICAL HISTORY:   SURGICAL:  1. Tonsillectomy in 1984.  2. Tubal ligation in 1985.  3. Hysterectomy in 1986 secondary to fibroid tumors.  4. Cyst removed from her left arm in 1987.  5. Kidney stone retrieval through ureteroscopy in 1987.  6. Sinus surgery in 1998.   MEDICAL ILLNESSES:  1. Usual childhood disease.  2. Left ovarian cyst with spontaneous rupture.  3. History of GI bleed in 1993.  4. Colon polyps on colonoscopy in 1993.  5. GYN - the patient is a gravida 2, para 2.   CURRENT MEDICATIONS:  HCTZ 25 mg daily.   SOCIAL HISTORY:  The patient has been widowed for several years.  The  patient is a Production designer, theatre/television/film of a Programmer, multimedia.  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  The  patient has had significant stress.  She lost her husband approximately  2 years ago, then she lost a nephew to bone cancer, then her father died  approximately a year ago, and now her mother has had significant medical  issues.  She has had a kidney transplant x8 years.  She developed breast  cancer in August and underwent mastectomy, and then had some early  kidney rejection.  The patient is her mother's primary caretaker and  finds this extremely stressful.  She has considered therapy, and I have  concurred this would be beneficial to  her.   REVIEW OF SYSTEMS:  The patient's weight has been stable.  She has had  no other constitutional problems.  She has had an eye exam in the last  12 months.  The patient has no ENT, cardiovascular or respiratory  complaints.  She does have occasional heartburn.  The patient has  occasional stress incontinence and will need to have a kegeling pamphlet  enclosed with this dictation mailed to her.  The patient does have left  knee and left proximal upper extremity shoulder discomfort, but this is  well treated with Tylenol.  No dermatologic or neurologic problems.   PHYSICAL EXAMINATION:  VITAL SIGNS:  Temperature was 98.2, blood  pressure 137/87, pulse 93, weight 203.  GENERAL APPEARANCE:  This is a heavy-set African-American woman in no  acute distress.  HEENT:  Normocephalic and atraumatic.  EAC's and TMs were unremarkable.  Oropharynx was normal.  Dentition in good repair.  No buccal or palatal  lesions were noted.  Posterior pharynx was clear.  Conjunctivae and  sclerae were clear.  Pupils equal, round and reactive to light and  accommodation.  Extraocular movements intact.  Funduscopic exam was  unremarkable.  NECK:  Supple without thyromegaly.  NODES:  No adenopathy was noted in the cervical or supraclavicular  regions.  CHEST:  No CVA tenderness.  LUNGS:  Clear with no rales, wheezes or rhonchi.  BREAST EXAM:  Deferred to gynecology.  CARDIOVASCULAR:  2+ radial pulse.  No JVD or carotid bruit.  She had a  quiet precordium with regular rate and rhythm without murmurs, rubs, or  gallops.  ABDOMEN:  Soft.  No guarding, no rebound.  There was no  organosplenomegaly noted.  PELVIC/RECTAL:  Deferred to gynecology.  EXTREMITIES:  Without clubbing, cyanosis, or edema or deformity.  NEUROLOGIC:  Nonfocal.   DATABASE:  Hemoglobin was 13.3 gm, white count was 7400 with a normal  differential.  Chemistries were unremarkable.  The serum glucose was 93.  Kidney function was normal  with a creatinine of 1.0.  Liver functions  were normal.  Cholesterol was 245.  Triglycerides were 150.  HDL was  58.4.  LDL was 155.5.  Thyroid function normal with a TSH of 1.19.  Urinalysis was negative.   ASSESSMENT AND PLAN:  1. Psychosocial.  I do believe the patient is carrying a heavy burden      and does seem to be emotionally labile.  I do not believe      medications are indicated at this time, but I would recommend she      have some short-term counseling.  I have given her the phone number      for [facility] and encouraged her to schedule an      appointment with one of our therapists.  2. Hypertension.  The patient's blood pressure is adequately      controlled on her present dose of medication.  She will continue      the same.  3. Hypertension.  The patient clearly had elevated LDL cholesterol      with a goal of 130 or less for a woman with low cardiac risk.  At      this point, diet and lifestyle management are the best treatment.      She will watch fats in her diet.  She will work to lose weight and      exercise on a regular basis.  I have recommended followup lipid      panel in 6 months.  4. Health maintenance.  The patient is current with her gynecologist,      Dr. Randell Patient, with last pelvic and Pap in July of 2007, last mammogram      in July of 2007; no copy available to me.   In summary, this is a very pleasant woman who seems to be medically  stable.  My biggest concern is her emotional health, and I have  encouraged her to contact [facility] for assistance.     Tami Gess Norins, MD  Electronically Signed    MEN/MedQ  DD: 07/12/2006  DT: 07/12/2006  Job #: [id number]   cc:   Tami Hughes

## 2010-11-13 NOTE — Op Note (Signed)
Tami Hughes, RIBAUDO NO.:  [id number]   MEDICAL RECORD NO.:  [id number]                   PATIENT TYPE:   LOCATION:                                       FACILITY:   PHYSICIAN:  Bertram Millard. Dahlstedt, M.D.          DATE OF BIRTH:   DATE OF PROCEDURE:  01/29/2002  DATE OF DISCHARGE:                                 OPERATIVE REPORT   PREOPERATIVE DIAGNOSES:  1. Left flank pain.  2. Gross hematuria.   POSTOPERATIVE DIAGNOSES:  1. Normal left ureter and renal pelvis.  2. Subtle lesion in right pyelocalyceal system consistent with probable     arterial venous malformation.   PRINCIPAL PROCEDURE:  Cystoscopy, bilateral ureteropyeloscopy, bilateral  retrograde pyelograms,   SURGEON:  Bertram Millard. Dahlstedt, M.D.   ANESTHESIA:  General with LMA.   COMPLICATIONS:  None.   BRIEF HISTORY:  This 55 year old female presented to me recently with gross  hematuria and left flank pain.  She also had a low-grade fever.  She went to  the emergency room  a couple of weeks ago and had a CT urogram which showed  duplicated collecting system on the left but no other abnormalities.  She  was subsequently seen in my office with persistent flank pain and gross  hematuria.  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  There was a hint of a stone in the left distal ureter.   Due to the patient's persistent gross hematuria, normal cystoscopic  findings, and her flank pain, it was recommended that he undergo cystoscopy  and retrograde pyelograms.  She is aware of risks and complications and desires to proceed.   DESCRIPTION OF PROCEDURE:  The patient was administered a general anesthetic  using the LMA.  She was placed in the dorsal lithotomy position.  Genitalia  and perineum were prepped and draped.  Cystoscope was advanced into her  bladder which was  inspected and found to be normal.  There was a small  amount of blood coming from the right ureteral orifice.  A left retrograde  ureteropyelogram was performed showing a fairly normal distal ureter with  bifurcation of the ureter in the pelvis.  There was mild dilatation of the  ureters all the way between the bifurcation and the renal pelvis.  No  filling defects were seen.  A rigid ureteroscope was advanced into the  ureter, and both segments of the ureter were scoped and found to be normal.  Right retrograde was performed showing no evident ureteral abnormalities.  There was a dilated lower pole calix.  This drained adequately, however.  No  other caliceal abnormalities were seen.  As the patient had gross hematuria  from the right ureter, the rigid and then the flexible ureteroscopes were  advanced into the right ureter and to the pyelocalyceal  system on the right.  Each calix was inspected.  A lower pole calix was found to have a small  degree of narrowing.  Additionally, an adjacent calix was found to have a  small venous structure which seemed like a bleb.  There was no bleeding seen  from this.  It did not appear to be of a papillary configuration.  Because  of the likelihood of this being an AVM, I did not perform a biopsy.  I felt  at this point that it would be best to leave this alone.  Later on, if this  persisted with bleeding, I would be equipped to use a laser or cauterization  unit to cauterize this after possible biopsy.   At this point, I decided to terminate the procedure.  No other lesions were  seen in the ureter on the right.  The scope was removed.  The bladder was  drained.  The patient was administered a B&O suppository and 30 mg of  Toradol IV.  I elected not to leave stents in.   The patient tolerated the procedure well.  She was awakened, extubated, and  taken to the PACU in stable condition.                                                 Bertram Millard.  Dahlstedt, M.D.    SMD/MEDQ  D:  01/29/2002  T:  02/02/2002  Job:  [id number]   cc:   Almedia Balls. Randell Patient, M.D.   Rosalyn Gess Norins, M.D. [facility]

## 2011-01-24 ENCOUNTER — Other Ambulatory Visit: Payer: Self-pay | Admitting: Internal Medicine

## 2011-01-27 ENCOUNTER — Other Ambulatory Visit: Payer: Self-pay | Admitting: Gynecology

## 2011-01-27 DIAGNOSIS — Z1231 Encounter for screening mammogram for malignant neoplasm of breast: Secondary | ICD-10-CM

## 2011-03-04 ENCOUNTER — Telehealth: Payer: Self-pay | Admitting: *Deleted

## 2011-03-04 NOTE — Telephone Encounter (Signed)
Pt called wanting to know if she could have her ultrasound done the same day of her Diag. Mammo  screening. The breast center told pt that she would need an order from doctor so insurance will pay.  Pt appointment is 03/12/11. Please advise

## 2011-03-05 NOTE — Telephone Encounter (Signed)
Pt informed per dr. Glenetta Hew verbal conversation. He can not write order for u/s, it is up to the radiologist if they see something from mammogram the pt must have a recheck.

## 2011-03-12 ENCOUNTER — Ambulatory Visit
Admission: RE | Admit: 2011-03-12 | Discharge: 2011-03-12 | Disposition: A | Discharge status: Home / Self Care | Payer: BC Managed Care – PPO | Source: Ambulatory Visit | Attending: Gynecology | Admitting: Gynecology

## 2011-03-12 DIAGNOSIS — Z1231 Encounter for screening mammogram for malignant neoplasm of breast: Secondary | ICD-10-CM

## 2011-03-15 ENCOUNTER — Encounter: Payer: Self-pay | Admitting: Anesthesiology

## 2011-03-17 ENCOUNTER — Telehealth: Payer: Self-pay | Admitting: *Deleted

## 2011-03-17 NOTE — Telephone Encounter (Signed)
Patient called... Just wanted to make sure that her breast mammo report was received from the breast center of [place].  Confirmed that report was received.

## 2011-03-22 ENCOUNTER — Encounter: Payer: Self-pay | Admitting: Gynecology

## 2011-03-22 ENCOUNTER — Other Ambulatory Visit (HOSPITAL_COMMUNITY)
Admission: RE | Admit: 2011-03-22 | Discharge: 2011-03-22 | Disposition: A | Discharge status: Home / Self Care | Payer: BC Managed Care – PPO | Source: Ambulatory Visit | Attending: Gynecology | Admitting: Gynecology

## 2011-03-22 ENCOUNTER — Ambulatory Visit (INDEPENDENT_AMBULATORY_CARE_PROVIDER_SITE_OTHER): Payer: BC Managed Care – PPO | Admitting: Gynecology

## 2011-03-22 DIAGNOSIS — Z01419 Encounter for gynecological examination (general) (routine) without abnormal findings: Secondary | ICD-10-CM

## 2011-03-22 DIAGNOSIS — N951 Menopausal and female climacteric states: Secondary | ICD-10-CM | POA: Insufficient documentation

## 2011-03-22 DIAGNOSIS — Z1211 Encounter for screening for malignant neoplasm of colon: Secondary | ICD-10-CM

## 2011-03-22 LAB — POC HEMOCCULT BLD/STL (OFFICE/1-CARD/DIAGNOSTIC): Fecal Occult Blood, POC: NEGATIVE

## 2011-03-22 NOTE — Progress Notes (Signed)
Tami Hughes 1955-10-04 [id number]   History:    55 y.o.  for annual exam with doing complaint was of weight gain. She's been followed by Dr. Devonne Doughty who has been monitor her for hypertension. Patient with past history of total abdominal hysterectomy. Patient with past history of a benign colonic adenomatous polyp in 2009. Her mammogram is up-to-date it was this year and she does her monthly self breast examination. She has not had a baseline mammogram as of yet.  Past medical history,surgical history, family history and social history were all reviewed and documented in the EPIC chart.  ROS:  Was performed and pertinent positives and negatives are included in the history.  Exam: chaperone present BP 142/92  Ht 5\' 3"  (1.6 m)  Wt 229 lb (103.874 kg)  BMI 40.57 kg/m2  Body mass index is 40.57 kg/(m^2).  General appearance : Well developed well nourished female. No acute distress HEENT: Neck supple, trachea midline, no carotid bruits, no thyroidmegaly Lungs: Clear to auscultation, no rhonchi or wheezes, or rib retractions  Heart: Regular rate and rhythm, no murmurs or gallops Breast:Examined in sitting and supine position were symmetrical in appearance, no palpable masses or tenderness,  no skin retraction, no nipple inversion, no nipple discharge, no skin discoloration, no axillary or supraclavicular lymphadenopathy Abdomen: no palpable masses or tenderness, no rebound or guarding Extremities: no edema or skin discoloration or tenderness  Pelvic:  Bartholin, Urethra, Skene Glands: Within normal limits             Vagina: No gross lesions or discharge  Cervix: Absent  Uterus  absent,   Adnexa  Without masses or tenderness  Anus and perineum  normal   Rectovaginal  normal sphincter tone without palpated masses or tenderness             Hemoccult performed result pending at time of this dictation     Assessment/Plan:  55 y.o. female for annual exam unremarkable. Pap smear and Hemoccult  testing was done today results pending at time of this dictation. Patient will schedule a bone density study. She was encouraged to continue monthly self breast examination. She will continue with her calcium and vitamin D for osteoporosis prevention. And will followup with Dr. Devonne Doughty her internist as well. She was encouraged to engage in regular exercise program and appropriate diet low in fat high in protein vegetables fruits and fiber. In November of 2011 patient had undergone a pubovaginal sling Virgina Jock) by Dr. Patsi Sears for stress urinary incontinence and she is doing well. We'll see her back in one year or when necessary.    Ok Edwards MD, 9:42 AM 03/22/2011

## 2011-06-08 ENCOUNTER — Ambulatory Visit: Payer: BC Managed Care – PPO

## 2011-07-28 ENCOUNTER — Encounter: Payer: Self-pay | Admitting: Internal Medicine

## 2011-08-17 ENCOUNTER — Other Ambulatory Visit: Payer: Self-pay | Admitting: Internal Medicine

## 2011-12-19 ENCOUNTER — Other Ambulatory Visit: Payer: Self-pay | Admitting: Internal Medicine

## 2011-12-21 ENCOUNTER — Encounter: Payer: Self-pay | Admitting: Internal Medicine

## 2012-01-13 ENCOUNTER — Ambulatory Visit (AMBULATORY_SURGERY_CENTER): Payer: BC Managed Care – PPO | Admitting: *Deleted

## 2012-01-13 ENCOUNTER — Encounter: Payer: Self-pay | Admitting: Internal Medicine

## 2012-01-13 VITALS — Ht 63.0 in | Wt 222.3 lb

## 2012-01-13 DIAGNOSIS — Z1211 Encounter for screening for malignant neoplasm of colon: Secondary | ICD-10-CM

## 2012-01-13 DIAGNOSIS — Z86010 Personal history of colonic polyps: Secondary | ICD-10-CM

## 2012-01-13 MED ORDER — MOVIPREP 100 G PO SOLR: 1.0000 | Freq: Once | ORAL | Status: DC

## 2012-01-27 ENCOUNTER — Encounter: Payer: Self-pay | Admitting: Internal Medicine

## 2012-01-27 ENCOUNTER — Ambulatory Visit (AMBULATORY_SURGERY_CENTER): Payer: BC Managed Care – PPO | Admitting: Internal Medicine

## 2012-01-27 VITALS — BP 127/62 | HR 71 | Temp 97.5°F | Resp 17 | Ht 63.0 in | Wt 222.0 lb

## 2012-01-27 DIAGNOSIS — Z860100 Personal history of colon polyps, unspecified: Secondary | ICD-10-CM | POA: Insufficient documentation

## 2012-01-27 DIAGNOSIS — Z1211 Encounter for screening for malignant neoplasm of colon: Secondary | ICD-10-CM

## 2012-01-27 DIAGNOSIS — K648 Other hemorrhoids: Secondary | ICD-10-CM

## 2012-01-27 MED ORDER — SODIUM CHLORIDE 0.9 % IV SOLN: 500.0000 mL | INTRAVENOUS | Status: DC

## 2012-01-27 NOTE — Patient Instructions (Addendum)
No polyps were seen today!  You do have small internal hemorrhoids.  Please read the information provided.  Thank you for choosing me and [facility].  Iva Boop, MD, FACG  YOU HAD AN ENDOSCOPIC PROCEDURE TODAY AT THE [facility]: Refer to the procedure report that was given to you for any specific questions about what was found during the examination.  If the procedure report does not answer your questions, please call your gastroenterologist to clarify.  If you requested that your care partner not be given the details of your procedure findings, then the procedure report has been included in a sealed envelope for you to review at your convenience later.  YOU SHOULD EXPECT: Some feelings of bloating in the abdomen. Passage of more gas than usual.  Walking can help get rid of the air that was put into your GI tract during the procedure and reduce the bloating. If you had a lower endoscopy (such as a colonoscopy or flexible sigmoidoscopy) you may notice spotting of blood in your stool or on the toilet paper. If you underwent a bowel prep for your procedure, then you may not have a normal bowel movement for a few days.  DIET: Your first meal following the procedure should be a light meal and then it is ok to progress to your normal diet.  A half-sandwich or bowl of soup is an example of a good first meal.  Heavy or fried foods are harder to digest and may make you feel nauseous or bloated.  Likewise meals heavy in dairy and vegetables can cause extra gas to form and this can also increase the bloating.  Drink plenty of fluids but you should avoid alcoholic beverages for 24 hours.  ACTIVITY: Your care partner should take you home directly after the procedure.  You should plan to take it easy, moving slowly for the rest of the day.  You can resume normal activity the day after the procedure however you should NOT DRIVE or use heavy machinery for 24 hours (because of the  sedation medicines used during the test).    SYMPTOMS TO REPORT IMMEDIATELY: A gastroenterologist can be reached at any hour.  During normal business hours, 8:30 AM to 5:00 PM Monday through Friday, call [phone].  After hours and on weekends, please call the GI answering service at [phone] who will take a message and have the physician on call contact you.   Following lower endoscopy (colonoscopy or flexible sigmoidoscopy):  Excessive amounts of blood in the stool  Significant tenderness or worsening of abdominal pains  Swelling of the abdomen that is new, acuteYOU HAD AN ENDOSCOPIC PROCEDURE TODAY AT THE [facility]: Refer to the procedure report that was given to you for any specific questions about what was found during the examination.  If the procedure report does not answer your questions, please call your gastroenterologist to clarify.  If you requested that your care partner not be given the details of your procedure findings, then the procedure report has been included in a sealed envelope for you to review at your convenience later.  YOU SHOULD EXPECT: Some feelings of bloating in the abdomen. Passage of more gas than usual.  Walking can help get rid of the air that was put into your GI tract during the procedure and reduce the bloating. If you had a lower endoscopy (such as a colonoscopy or flexible sigmoidoscopy) you may notice spotting of blood in your stool or on  the toilet paper. If you underwent a bowel prep for your procedure, then you may not have a normal bowel movement for a few days.  DIET: Your first meal following the procedure should be a light meal and then it is ok to progress to your normal diet.  A half-sandwich or bowl of soup is an example of a good first meal.  Heavy or fried foods are harder to digest and may make you feel nauseous or bloated.  Likewise meals heavy in dairy and vegetables can cause extra gas to form and this can also increase  the bloating.  Drink plenty of fluids but you should avoid alcoholic beverages for 24 hours.  ACTIVITY: Your care partner should take you home directly after the procedure.  You should plan to take it easy, moving slowly for the rest of the day.  You can resume normal activity the day after the procedure however you should NOT DRIVE or use heavy machinery for 24 hours (because of the sedation medicines used during the test).    SYMPTOMS TO REPORT IMMEDIATELY: A gastroenterologist can be reached at any hour.  During normal business hours, 8:30 AM to 5:00 PM Monday through Friday, call [phone].  After hours and on weekends, please call the GI answering service at [phone] who will take a message and have the physician on call contact you.   Following lower endoscopy (colonoscopy or flexible sigmoidoscopy):  Excessive amounts of blood in the stool  Significant tenderness or worsening of abdominal pains  Swelling of the abdomen that is new, acute  Fever of 100F or higher   FOLLOW UP: If any biopsies were taken you will be contacted by phone or by letter within the next 1-3 weeks.  Call your gastroenterologist if you have not heard about the biopsies in 3 weeks.  Our staff will call the home number listed on your records the next business day following your procedure to check on you and address any questions or concerns that you may have at that time regarding the information given to you following your procedure. This is a courtesy call and so if there is no answer at the home number and we have not heard from you through the emergency physician on call, we will assume that you have returned to your regular daily activities without incident.  SIGNATURES/CONFIDENTIALITY: You and/or your care partner have signed paperwork which will be entered into your electronic medical record.  These signatures attest to the fact that that the information above on your After Visit Summary has been  reviewed and is understood.  Full responsibility of the confidentiality of this discharge information lies with you and/or your care-partner.    Hemorrhoid information given,  Fever of 100F or higher  FOLLOW UP:       If any biopsies were taken you will be contacted by phone or by letter within the next 1-3 weeks.  Call your gastroenterologist if you have not heard about the biopsies in 3 weeks.  Our staff will call the home number listed on your records the next business day following your procedure to check on you and address any questions or concerns that you may have at that time regarding the information given to you following your procedure. This is a courtesy call and so if there is no answer at the home number and we have not heard from you through the emergency physician on call, we will assume that you have returned to your  regular daily activities without incident.  SIGNATURES/CONFIDENTIALITY: You and/or your care partner have signed paperwork which will be entered into your electronic medical record.  These signatures attest to the fact that that the information above on your After Visit Summary has been reviewed and is understood.  Full responsibility of the confidentiality of this discharge information lies with you and/or your care-partner.

## 2012-01-27 NOTE — Progress Notes (Signed)
Patient did not experience any of the following events: a burn prior to discharge; a fall within the facility; wrong site/side/patient/procedure/implant event; or a hospital transfer or hospital admission upon discharge from the facility. (G8907) Patient with preoperative order for IV antibiotic SSI prophylaxis, antibiotic initiated on time. (G8916)  

## 2012-01-27 NOTE — Op Note (Signed)
[facility] [address] [place], [place]  [address]  COLONOSCOPY PROCEDURE REPORT  PATIENT:  Tami, Hughes  MR#:  [id number] BIRTHDATE:  1956-03-31, 56 yrs. old  GENDER:  female ENDOSCOPIST:  Iva Boop, MD, [facility]  PROCEDURE DATE:  01/27/2012 PROCEDURE:  Colonoscopy 21308 ASA CLASS:  Class II INDICATIONS:  surveillance and high-risk screening, history of pre-cancerous (adenomatous) colon polyps 12 adenoma removed 2009 - index colonoscopy MEDICATIONS:   These medications were titrated to patient response per physician's verbal order, MAC sedation, administered by CRNA, propofol (Diprivan) 200 mg IV  DESCRIPTION OF PROCEDURE:   After the risks benefits and alternatives of the procedure were thoroughly explained, informed consent was obtained.  Digital rectal exam was performed and revealed no abnormalities.   The LB CF-[id number] endoscope was introduced through the anus and advanced to the cecum, which was identified by both the appendix and ileocecal valve, without limitations.  The quality of the prep was excellent, using MoviPrep.  The instrument was then slowly withdrawn as the colon was fully examined. <<PROCEDUREIMAGES>>  FINDINGS:  A normal appearing cecum, ileocecal valve, and appendiceal orifice were identified. The ascending, hepatic flexure, transverse, splenic flexure, descending, sigmoid colon, and rectum appeared unremarkable.   Retroflexed views in the rectum revealed internal hemorrhoids.    The time to cecum = 4:54 minutes. The scope was carefully withdrawn in 8:26 minutes from the cecum and the procedure completed. COMPLICATIONS:  None ENDOSCOPIC IMPRESSION: 1) Normal colon 2) Internal hemorrhoids 3) history of adenoma removal 2009  REPEAT EXAM:  In 5 year(s) for routine screening colonoscopy. 2018  Iva Boop, MD, [facility]  CC:  The Patient  n. eSIGNED:   Iva Boop at 01/27/2012 11:29 AM  [place], [phone]

## 2012-01-28 ENCOUNTER — Telehealth: Payer: Self-pay | Admitting: *Deleted

## 2012-01-28 NOTE — Telephone Encounter (Signed)
  Follow up Call-  Call back number 01/27/2012  Post procedure Call Back phone  # [phone]  Permission to leave phone message Yes     Patient questions:  Do you have a fever, pain , or abdominal swelling? no Pain Score  0 *  Have you tolerated food without any problems? yes  Have you been able to return to your normal activities? yes  Do you have any questions about your discharge instructions: Diet   no Medications  yes Follow up visit  no  Do you have questions or concerns about your Care? no  Actions: * If pain score is 4 or above: No action needed, pain <4.  Pt. Asked if she could resume her regular meds today.  Advised that this would be OK to resume meds.

## 2012-01-29 ENCOUNTER — Other Ambulatory Visit: Payer: Self-pay | Admitting: Internal Medicine

## 2012-02-08 ENCOUNTER — Other Ambulatory Visit: Payer: Self-pay | Admitting: Gynecology

## 2012-02-08 DIAGNOSIS — Z1231 Encounter for screening mammogram for malignant neoplasm of breast: Secondary | ICD-10-CM

## 2012-03-13 ENCOUNTER — Ambulatory Visit
Admission: RE | Admit: 2012-03-13 | Discharge: 2012-03-13 | Disposition: A | Discharge status: Home / Self Care | Payer: BC Managed Care – PPO | Source: Ambulatory Visit | Attending: Gynecology | Admitting: Gynecology

## 2012-03-13 DIAGNOSIS — Z1231 Encounter for screening mammogram for malignant neoplasm of breast: Secondary | ICD-10-CM

## 2012-05-02 ENCOUNTER — Other Ambulatory Visit (INDEPENDENT_AMBULATORY_CARE_PROVIDER_SITE_OTHER): Payer: BC Managed Care – PPO

## 2012-05-02 ENCOUNTER — Ambulatory Visit (INDEPENDENT_AMBULATORY_CARE_PROVIDER_SITE_OTHER): Payer: BC Managed Care – PPO | Admitting: Internal Medicine

## 2012-05-02 ENCOUNTER — Encounter: Payer: Self-pay | Admitting: Internal Medicine

## 2012-05-02 VITALS — BP 152/82 | HR 94 | Temp 98.4°F | Resp 16 | Wt 216.0 lb

## 2012-05-02 DIAGNOSIS — Z860100 Personal history of colon polyps, unspecified: Secondary | ICD-10-CM

## 2012-05-02 DIAGNOSIS — E785 Hyperlipidemia, unspecified: Secondary | ICD-10-CM

## 2012-05-02 DIAGNOSIS — E669 Obesity, unspecified: Secondary | ICD-10-CM

## 2012-05-02 DIAGNOSIS — I10 Essential (primary) hypertension: Secondary | ICD-10-CM

## 2012-05-02 DIAGNOSIS — Z0000 Encounter for general adult medical examination without abnormal findings: Secondary | ICD-10-CM | POA: Insufficient documentation

## 2012-05-02 DIAGNOSIS — K219 Gastro-esophageal reflux disease without esophagitis: Secondary | ICD-10-CM

## 2012-05-02 DIAGNOSIS — E66812 Obesity, class 2: Secondary | ICD-10-CM

## 2012-05-02 DIAGNOSIS — Z23 Encounter for immunization: Secondary | ICD-10-CM

## 2012-05-02 LAB — COMPREHENSIVE METABOLIC PANEL
ALT: 31 U/L (ref 0–35)
AST: 25 U/L (ref 0–37)
Alkaline Phosphatase: 77 U/L (ref 39–117)
Glucose, Bld: 93 mg/dL (ref 70–99)
Potassium: 3.4 mEq/L — ABNORMAL LOW (ref 3.5–5.1)
Sodium: 138 mEq/L (ref 135–145)
Total Bilirubin: 0.6 mg/dL (ref 0.3–1.2)
Total Protein: 7.4 g/dL (ref 6.0–8.3)

## 2012-05-02 LAB — HEPATIC FUNCTION PANEL
ALT: 31 U/L (ref 0–35)
AST: 25 U/L (ref 0–37)
Total Bilirubin: 0.6 mg/dL (ref 0.3–1.2)
Total Protein: 7.4 g/dL (ref 6.0–8.3)

## 2012-05-02 LAB — LIPID PANEL
HDL: 52.8 mg/dL (ref >39.00)
Total CHOL/HDL Ratio: 5
Triglycerides: 178 mg/dL — ABNORMAL HIGH (ref 0.0–149.0)
VLDL: 35.6 mg/dL (ref 0.0–40.0)

## 2012-05-02 LAB — LDL CHOLESTEROL, DIRECT: Direct LDL: 179.1 mg/dL

## 2012-05-02 NOTE — Progress Notes (Signed)
Subjective:    Patient ID: Tami Hughes, female    DOB: 1956/05/12, 56 y.o.   MRN: [id number]  HPI Mrs. Tami Hughes presents for a general wellness exam. She has been feeling well. She has continued to work on weight loss although she is not meeting the success she would like. Her last LDL was 150 and she has not been on medication. Seeing Gyn (Dr. Lily Peer) tomorrow. She is current with dentist and ophthal.   Past Medical History  Diagnosis Date  . Hypertension   . Polyp of colon 2009    adenoma benign   Past Surgical History  Procedure Date  . Abdominal hysterectomy   . Labioplasty   . Vulvectomy     partial vulvectomy secondary to labial hypertrophy..  . Pubovaginal sling 05/08/2010    stress urinary incontinence  . Colonoscopy    Family History  Problem Relation Age of Onset  . Breast cancer Mother   . Kidney disease Mother     had transplant  . Cancer Mother     breast cancer.  . Colon cancer Neg Hx   . Esophageal cancer Neg Hx   . Rectal cancer Neg Hx   . Stomach cancer Neg Hx   . Diabetes Neg Hx   . Hyperlipidemia Neg Hx   . Heart disease Neg Hx   . COPD Neg Hx    History   Social History  . Marital Status: Widowed    Spouse Name: N/A    Number of Children: 2  . Years of Education: 18   Occupational History  . paralegal    Social History Main Topics  . Smoking status: Never Smoker   . Smokeless tobacco: Never Used  . Alcohol Use: 1.0 oz/week    2 drink(s) per week     Comment: occasionally - wine  . Drug Use: No  . Sexually Active: Yes -- Female partner(s)   Other Topics Concern  . Not on file   Social History Narrative   [employer], [employer] - BA, [employer] - masters in Surveyor, minerals. Married '74 - 27 yrs/widowed. Remains single. 1 dtr - '78, 1 son - '81; 1 grandchild. 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 Has high potential for work but will finish master's first. Lives alone.    Current Outpatient Prescriptions  on File Prior to Visit  Medication Sig Dispense Refill  . calcium-vitamin D (OSCAL WITH D) 500-200 MG-UNIT per tablet Take 1 tablet by mouth daily.        Marland Kitchen diltiazem (CARDIZEM CD) 240 MG 24 hr capsule TAKE ONE CAPSULE BY MOUTH EVERY DAY  90 capsule  1  . diltiazem (DILACOR XR) 240 MG 24 hr capsule Take 240 mg by mouth daily.      Marland Kitchen ibuprofen (ADVIL,MOTRIN) 200 MG tablet Take 800 mg by mouth every 6 (six) hours as needed.      Marland Kitchen lisinopril-hydrochlorothiazide (PRINZIDE,ZESTORETIC) 20-12.5 MG per tablet TAKE ONE TABLET BY MOUTH EVERY DAY  30 tablet  5  . Methylcellulose, Laxative, (CITRUCEL) 500 MG TABS Take 500 mg by mouth as needed.       . Multiple Vitamin (MULTIVITAMIN) capsule Take 1 capsule by mouth as needed.       . ranitidine (ZANTAC) 150 MG capsule Take 150 mg by mouth as needed.      . vitamin E (VITAMIN E) 400 UNIT capsule Take 400 Units by mouth daily.  Review of Systems Constitutional:  Negative for fever, chills, activity change and unexpected weight change.  HEENT:  Negative for hearing loss, ear pain, congestion, neck stiffness and postnasal drip. Negative for sore throat or swallowing problems. Negative for dental complaints.   Eyes: Negative for vision loss or change in visual acuity.  Respiratory: Negative for chest tightness and wheezing. Negative for DOE.   Cardiovascular: Negative for chest pain or palpitations. No decreased exercise tolerance Gastrointestinal: No change in bowel habit. No bloating or gas. Positive for reflux or indigestion Genitourinary: Negative for urgency, frequency, flank pain and difficulty urinating.  Musculoskeletal: Negative for myalgias, back pain, arthralgias and gait problem.  Neurological: Negative for dizziness, tremors, weakness and headaches.  Hematological: Negative for adenopathy.  Psychiatric/Behavioral: Negative for behavioral problems and dysphoric mood.       Objective:   Physical Exam Filed Vitals:   05/02/12  1423  BP: 152/82  Pulse: 94  Temp: 98.4 F (36.9 C)  Resp: 16   Wt Readings from Last 3 Encounters:  05/02/12 216 lb (97.977 kg)  01/27/12 222 lb (100.699 kg)  01/13/12 222 lb 4.8 oz (100.835 kg)   Gen'l: well nourished, well developed AA Woman in no distress HEENT - [place]/AT, EACs/TMs normal, oropharynx with native dentition in good condition, no buccal or palatal lesions, posterior pharynx clear, mucous membranes moist. C&S clear, PERRLA, fundi - normal Neck - supple, no thyromegaly Nodes- negative submental, cervical, supraclavicular regions Chest - no deformity, no CVAT Lungs - cleat without rales, wheezes. No increased work of breathing Breast - deferred to gyn and recent mammogram Cardiovascular - regular rate and rhythm, quiet precordium, no murmurs, rubs or gallops, 2+ radial, DP and PT pulses Abdomen - BS+ x 4, no HSM, no guarding or rebound or tenderness, obese Pelvic - deferred to gyn Rectal - deferred to gyn Extremities - no clubbing, cyanosis, edema or deformity.  Neuro - A&O x 3, CN II-XII normal, motor strength normal and equal, DTRs 2+ and symmetrical biceps, radial, and patellar tendons. Cerebellar - no tremor, no rigidity, fluid movement and normal gait. Derm - Head, neck, back, abdomen and extremities without suspicious lesions  Lab Results  Component Value Date   WBC 7.1 07/23/2010   HGB 13.4 07/23/2010   HCT 39.2 07/23/2010   PLT 237.0 07/23/2010   GLUCOSE 93 05/02/2012   CHOL 266* 05/02/2012   TRIG 178.0* 05/02/2012   HDL 52.80 05/02/2012   LDLDIRECT 179.1 05/02/2012        ALT 31 05/02/2012   AST 25 05/02/2012        NA 138 05/02/2012   K 3.4* 05/02/2012   CL 101 05/02/2012   CREATININE 1.0 05/02/2012   BUN 11 05/02/2012   CO2 30 05/02/2012   TSH 1.51 07/23/2010           Assessment & Plan:

## 2012-05-02 NOTE — Patient Instructions (Addendum)
Good to see you and good luck finishing that degree  Exam to day is good except for weight. Plan Calorie count 5 days, only tally days 3-5  Smart food choices, PORTION SIZE CONTROL, exercise - you should loose weight. Burn more calories than you eat.  Labs today.  Full report to follow

## 2012-05-02 NOTE — Assessment & Plan Note (Signed)
Interval history is without major illness, surgery or injury. Limited physical exam is normal. Current with colorectal and breast cancer screening. She is seeing Gyn Nov 6th. Immunizations - up to date.   In summary - a very pleasant woman who is in good health and medically stable with major problem being weight management and moderately elevated cholesterol. She will return to further discuss weight management at her convenience. She will return for general exam in 1 year.

## 2012-05-02 NOTE — Assessment & Plan Note (Signed)
Controlled.  

## 2012-05-02 NOTE — Assessment & Plan Note (Signed)
Current with colonoscopy August '13 - normal study

## 2012-05-02 NOTE — Assessment & Plan Note (Signed)
Difficult problem - patient is working hard at exercise and diet but not loosing weight  Plan Calorie count - get a baseline of calorie consumption  Weight management: smart food choices, PORTION SIZE CONTROL, regular exercise.  Target weight 160 lbs. Goal - to loose 1-2 lbs/month (2-4 year project)

## 2012-05-02 NOTE — Assessment & Plan Note (Signed)
BP Readings from Last 3 Encounters:  05/02/12 152/82  01/27/12 127/62  03/22/11 142/92   Variable control. Will not change medications but will ask patient to monitor BP as an outpatient, If SBP running greater than 140 will need to adjust medications.

## 2012-05-03 ENCOUNTER — Encounter: Payer: Self-pay | Admitting: Gynecology

## 2012-05-03 ENCOUNTER — Ambulatory Visit (INDEPENDENT_AMBULATORY_CARE_PROVIDER_SITE_OTHER): Payer: BC Managed Care – PPO | Admitting: Gynecology

## 2012-05-03 VITALS — BP 120/86 | Ht 63.0 in | Wt 216.0 lb

## 2012-05-03 DIAGNOSIS — N951 Menopausal and female climacteric states: Secondary | ICD-10-CM

## 2012-05-03 DIAGNOSIS — Z01419 Encounter for gynecological examination (general) (routine) without abnormal findings: Secondary | ICD-10-CM

## 2012-05-03 NOTE — Patient Instructions (Addendum)
Cholesterol Control Diet  Cholesterol levels in your body are determined significantly by your diet. Cholesterol levels may also be related to heart disease. The following material helps to explain this relationship and discusses what you can do to help keep your heart healthy. Not all cholesterol is bad. Low-density lipoprotein (LDL) cholesterol is the "bad" cholesterol. It may cause fatty deposits to build up inside your arteries. High-density lipoprotein (HDL) cholesterol is "good." It helps to remove the "bad" LDL cholesterol from your blood. Cholesterol is a very important risk factor for heart disease. Other risk factors are high blood pressure, smoking, stress, heredity, and weight. The heart muscle gets its supply of blood through the coronary arteries. If your LDL cholesterol is high and your HDL cholesterol is low, you are at risk for having fatty deposits build up in your coronary arteries. This leaves less room through which blood can flow. Without sufficient blood and oxygen, the heart muscle cannot function properly and you may feel chest pains (angina pectoris). When a coronary artery closes up entirely, a part of the heart muscle may die, causing a heart attack (myocardial infarction). CHECKING CHOLESTEROL When your caregiver sends your blood to a lab to be analyzed for cholesterol, a complete lipid (fat) profile may be done. With this test, the total amount of cholesterol and levels of LDL and HDL are determined. Triglycerides are a type of fat that circulates in the blood and can also be used to determine heart disease risk. The list below describes what the numbers should be: Test: Total Cholesterol.  Less than 200 mg/dl.  Test: LDL "bad cholesterol."  Less than 100 mg/dl.   Less than 70 mg/dl if you are at very high risk of a heart attack or sudden cardiac death.  Test: HDL "good cholesterol."  Greater than 50 mg/dl for women.    Greater than 40 mg/dl for men.  Test: Triglycerides.  Less than 150 mg/dl.  CONTROLLING CHOLESTEROL WITH DIET Although exercise and lifestyle factors are important, your diet is key. That is because certain foods are known to raise cholesterol and others to lower it. The goal is to balance foods for their effect on cholesterol and more importantly, to replace saturated and trans fat with other types of fat, such as monounsaturated fat, polyunsaturated fat, and omega-3 fatty acids. On average, a person should consume no more than 15 to 17 g of saturated fat daily. Saturated and trans fats are considered "bad" fats, and they will raise LDL cholesterol. Saturated fats are primarily found in animal products such as meats, butter, and cream. However, that does not mean you need to sacrifice all your favorite foods. Today, there are good tasting, low-fat, low-cholesterol substitutes for most of the things you like to eat. Choose low-fat or nonfat alternatives. Choose round or loin cuts of red meat, since these types of cuts are lowest in fat and cholesterol. Chicken (without the skin), fish, veal, and ground [place] breast are excellent choices. Eliminate fatty meats, such as hot dogs and salami. Even shellfish have little or no saturated fat. Have a 3 oz (85 g) portion when you eat lean meat, poultry, or fish. Trans fats are also called "partially hydrogenated oils." They are oils that have been scientifically manipulated so that they are solid at room temperature resulting in a longer shelf life and improved taste and texture of foods in which they are added. Trans fats are found in stick margarine, some tub margarines, cookies, crackers, and baked goods.  When baking and cooking, oils are an excellent substitute for butter. The monounsaturated oils are especially beneficial since it is believed they lower LDL and raise HDL. The oils you should avoid entirely are saturated tropical oils, such as coconut and  palm.  Remember to eat liberally from food groups that are naturally free of saturated and trans fat, including fish, fruit, vegetables, beans, grains (barley, rice, couscous, bulgur wheat), and pasta (without cream sauces).  IDENTIFYING FOODS THAT LOWER CHOLESTEROL  Soluble fiber may lower your cholesterol. This type of fiber is found in fruits such as apples, vegetables such as broccoli, potatoes, and carrots, legumes such as beans, peas, and lentils, and grains such as barley. Foods fortified with plant sterols (phytosterol) may also lower cholesterol. You should eat at least 2 g per day of these foods for a cholesterol lowering effect.  Read package labels to identify low-saturated fats, trans fats free, and low-fat foods at the supermarket. Select cheeses that have only 2 to 3 g saturated fat per ounce. Use a heart-healthy tub margarine that is free of trans fats or partially hydrogenated oil. When buying baked goods (cookies, crackers), avoid partially hydrogenated oils. Breads and muffins should be made from whole grains (whole-wheat or whole oat flour, instead of "flour" or "enriched flour"). Buy non-creamy canned soups with reduced salt and no added fats.  FOOD PREPARATION TECHNIQUES  Never deep-fry. If you must fry, either stir-fry, which uses very little fat, or use non-stick cooking sprays. When possible, broil, bake, or roast meats, and steam vegetables. Instead of dressing vegetables with butter or margarine, use lemon and herbs, applesauce and cinnamon (for squash and sweet potatoes), nonfat yogurt, salsa, and low-fat dressings for salads.  LOW-SATURATED FAT / LOW-FAT FOOD SUBSTITUTES Meats / Saturated Fat (g)  Avoid: Steak, marbled (3 oz/85 g) / 11 g   Choose: Steak, lean (3 oz/85 g) / 4 g   Avoid: Hamburger (3 oz/85 g) / 7 g   Choose: Hamburger, lean (3 oz/85 g) / 5 g   Avoid: Ham (3 oz/85 g) / 6 g   Choose: Ham, lean cut (3 oz/85 g) / 2.4 g   Avoid: Chicken, with skin, dark  meat (3 oz/85 g) / 4 g   Choose: Chicken, skin removed, dark meat (3 oz/85 g) / 2 g   Avoid: Chicken, with skin, light meat (3 oz/85 g) / 2.5 g   Choose: Chicken, skin removed, light meat (3 oz/85 g) / 1 g  Dairy / Saturated Fat (g)  Avoid: Whole milk (1 cup) / 5 g   Choose: Low-fat milk, 2% (1 cup) / 3 g   Choose: Low-fat milk, 1% (1 cup) / 1.5 g   Choose: Skim milk (1 cup) / 0.3 g   Avoid: Hard cheese (1 oz/28 g) / 6 g   Choose: Skim milk cheese (1 oz/28 g) / 2 to 3 g   Avoid: Cottage cheese, 4% fat (1 cup) / 6.5 g   Choose: Low-fat cottage cheese, 1% fat (1 cup) / 1.5 g   Avoid: Ice cream (1 cup) / 9 g   Choose: Sherbet (1 cup) / 2.5 g   Choose: Nonfat frozen yogurt (1 cup) / 0.3 g   Choose: Frozen fruit bar / trace   Avoid: Whipped cream (1 tbs) / 3.5 g   Choose: Nondairy whipped topping (1 tbs) / 1 g  Condiments / Saturated Fat (g)  Avoid: Mayonnaise (1 tbs) / 2 g   Choose: Low-fat  mayonnaise (1 tbs) / 1 g   Avoid: Butter (1 tbs) / 7 g   Choose: Extra light margarine (1 tbs) / 1 g   Avoid: Coconut oil (1 tbs) / 11.8 g   Choose: Olive oil (1 tbs) / 1.8 g   Choose: Corn oil (1 tbs) / 1.7 g   Choose: Safflower oil (1 tbs) / 1.2 g   Choose: Sunflower oil (1 tbs) / 1.4 g   Choose: Soybean oil (1 tbs) / 2.4 g   Choose: Canola oil (1 tbs) / 1 g  Document Released: 06/14/2005 Document Revised: 02/24/2011 Document Reviewed: 12/03/2010 [facility] Patient Information 2012 [place], [place].  Exercise to Lose Weight Exercise and a healthy diet may help you lose weight. Your doctor may suggest specific exercises. EXERCISE IDEAS AND TIPS  Choose low-cost things you enjoy doing, such as walking, bicycling, or exercising to workout videos.   Take stairs instead of the elevator.   Walk during your lunch break.   Park your car further away from work or school.   Go to a gym or an exercise class.   Start with 5 to 10 minutes of exercise each day. Build up to  30 minutes of exercise 4 to 6 days a week.   Wear shoes with good support and comfortable clothes.   Stretch before and after working out.   Work out until you breathe harder and your heart beats faster.   Drink extra water when you exercise.   Do not do so much that you hurt yourself, feel dizzy, or get very short of breath.  Exercises that burn about 150 calories:  Running 1  miles in 15 minutes.   Playing volleyball for 45 to 60 minutes.   Washing and waxing a car for 45 to 60 minutes.   Playing touch football for 45 minutes.   Walking 1  miles in 35 minutes.   Pushing a stroller 1  miles in 30 minutes.   Playing basketball for 30 minutes.   Raking leaves for 30 minutes.   Bicycling 5 miles in 30 minutes.   Walking 2 miles in 30 minutes.   Dancing for 30 minutes.   Shoveling snow for 15 minutes.   Swimming laps for 20 minutes.   Walking up stairs for 15 minutes.   Bicycling 4 miles in 15 minutes.   Gardening for 30 to 45 minutes.   Jumping rope for 15 minutes.   Washing windows or floors for 45 to 60 minutes.  Document Released: 07/17/2010 Document Revised: 02/24/2011 Document Reviewed: 07/17/2010 [facility] Patient Information 2012 [place], [place].   Bone Densitometry Bone densitometry is a special X-ray that measures your bone density and can be used to help predict your risk of bone fractures. This test is used to determine bone mineral content and density to diagnose osteoporosis. Osteoporosis is the loss of bone that may cause the bone to become weak. Osteoporosis commonly occurs in women entering menopause. However, it may be found in men and in people with other diseases. PREPARATION FOR TEST No preparation necessary. WHO SHOULD BE TESTED?  All women older than 93.  Postmenopausal women (50 to 56) with risk factors for osteoporosis.  People with a previous fracture caused by normal activities.  People with a small body frame (less than 127  poundsor a body mass index [BMI] of less than 21).  People who have a parent with a hip fracture or history of osteoporosis.  People who smoke.  People who have  rheumatoid arthritis.  Anyone who engages in excessive alcohol use (more than 3 drinks most days).  Women who experience early menopause. WHEN SHOULD YOU BE RETESTED? Current guidelines suggest that you should wait at least 2 years before doing a bone density test again if your first test was normal.Recent studies indicated that women with normal bone density may be able to wait a few years before needing to repeat a bone density test. You should discuss this with your caregiver.  NORMAL FINDINGS   Normal: less than standard deviation below normal (greater than -1).  Osteopenia: 1 to 2.5 standard deviations below normal (-1 to -2.5).  Osteoporosis: greater than 2.5 standard deviations below normal (less than -2.5). Test results are reported as a "T score" and a "Z score."The T score is a number that compares your bone density with the bone density of healthy, young women.The Z score is a number that compares your bone density with the scores of women who are the same age, gender, and race.  Ranges for normal findings may vary among different laboratories and hospitals. You should always check with your doctor after having lab work or other tests done to discuss the meaning of your test results and whether your values are considered within normal limits. MEANING OF TEST  Your caregiver will go over the test results with you and discuss the importance and meaning of your results, as well as treatment options and the need for additional tests if necessary. OBTAINING THE TEST RESULTS It is your responsibility to obtain your test results. Ask the lab or department performing the test when and how you will get your results. Document Released: 07/06/2004 Document Revised: 09/06/2011 Document Reviewed: 07/29/2010 [facility] Patient  Information 2013 [place], [place].

## 2012-05-03 NOTE — Progress Notes (Signed)
Tami Hughes Mar 03, 1956 [id number]   History:    56 y.o.  for annual gyn exam with no complaints today. She has been under the care of Dr.Norins for her hypertension and blood work. Review of her record indicated she had a mammogram this year which was normal. She does her monthly self was examination. Review of her record indicated in 2009 she had colonic polyp/adenoma removed. She had a negative colonoscopy this year. She's been followed every 5 years. Patient with prior history of total abdominal hysterectomy as was a pubovaginal sling Virgina Jock) and has done well. She has not had a bone density study as of yet. Patient yesterday received her flu vaccine as well as her Tdap vaccine. Patient with no past history of abnormal Pap smears.  Past medical history,surgical history, family history and social history were all reviewed and documented in the EPIC chart.  Gynecologic History No LMP recorded. Patient has had a hysterectomy. Contraception: status post hysterectomy Last Pap: 2012. Results were: normal Last mammogram: 2013. Results were: normal  Obstetric History OB History    Grav Para Term Preterm Abortions TAB SAB Ect Mult Living   2 2 2       2      # Outc Date GA Lbr Len/2nd Wgt Sex Del Anes PTL Lv   1 TRM     F SVD  No Yes   2 TRM     M SVD  No Yes       ROS: A ROS was performed and pertinent positives and negatives are included in the history.  GENERAL: No fevers or chills. HEENT: No change in vision, no earache, sore throat or sinus congestion. NECK: No pain or stiffness. CARDIOVASCULAR: No chest pain or pressure. No palpitations. PULMONARY: No shortness of breath, cough or wheeze. GASTROINTESTINAL: No abdominal pain, nausea, vomiting or diarrhea, melena or bright red blood per rectum. GENITOURINARY: No urinary frequency, urgency, hesitancy or dysuria. MUSCULOSKELETAL: No joint or muscle pain, no back pain, no recent trauma. DERMATOLOGIC: No rash, no itching, no lesions. ENDOCRINE: No  polyuria, polydipsia, no heat or cold intolerance. No recent change in weight. HEMATOLOGICAL: No anemia or easy bruising or bleeding. NEUROLOGIC: No headache, seizures, numbness, tingling or weakness. PSYCHIATRIC: No depression, no loss of interest in normal activity or change in sleep pattern.     Exam: chaperone present  BP 120/86  Ht 5\' 3"  (1.6 m)  Wt 216 lb (97.977 kg)  BMI 38.26 kg/m2  Body mass index is 38.26 kg/(m^2).  General appearance : Well developed well nourished female. No acute distress HEENT: Neck supple, trachea midline, no carotid bruits, no thyroidmegaly Lungs: Clear to auscultation, no rhonchi or wheezes, or rib retractions  Heart: Regular rate and rhythm, no murmurs or gallops Breast:Examined in sitting and supine position were symmetrical in appearance, no palpable masses or tenderness,  no skin retraction, no nipple inversion, no nipple discharge, no skin discoloration, no axillary or supraclavicular lymphadenopathy Abdomen: no palpable masses or tenderness, no rebound or guarding Extremities: no edema or skin discoloration or tenderness  Pelvic:  Bartholin, Urethra, Skene Glands: Within normal limits             Vagina: No gross lesions or discharge  Cervix: Absent  Uterus absent Adnexa  Without masses or tenderness  Anus and perineum  normal   Rectovaginal  normal sphincter tone without palpated masses or tenderness             Hemoccult not done colonoscopy this  year negative     Assessment/Plan:  56 y.o. female for annual exam with no abnormalities detected on gynecological exam. Patient has a very vague vasomotor symptoms. We discussed importance of calcium and vitamin D for osteoporosis prevention as well as regular exercise. She will schedule a bone density study here in the office in the next couple weeks. We discussed the new Pap smear screening guidelines and she will no longer need Pap smears. Literature information on cholesterol-lowering diet and  exercise was provided as well.    Ok Edwards MD, 11:41 AM 05/03/2012

## 2012-05-06 NOTE — Assessment & Plan Note (Signed)
Last LDL Nov 5, '13 - 179.  Plan - above treatment threshold of 160+ (NCEP-ATP III) - will have patient return to discuss treatment options

## 2012-05-09 ENCOUNTER — Other Ambulatory Visit: Payer: Self-pay

## 2012-05-09 MED ORDER — LISINOPRIL-HYDROCHLOROTHIAZIDE 20-12.5 MG PO TABS: 1.0000 | ORAL_TABLET | Freq: Every day | ORAL | Status: DC

## 2012-05-09 MED ORDER — DILTIAZEM HCL ER COATED BEADS 240 MG PO CP24: 240.0000 mg | ORAL_CAPSULE | Freq: Every day | ORAL | Status: DC

## 2012-05-10 ENCOUNTER — Telehealth: Payer: Self-pay

## 2012-05-10 DIAGNOSIS — E785 Hyperlipidemia, unspecified: Secondary | ICD-10-CM

## 2012-05-10 MED ORDER — ATORVASTATIN CALCIUM 20 MG PO TABS: 20.0000 mg | ORAL_TABLET | Freq: Every day | ORAL | Status: DC

## 2012-05-10 NOTE — Telephone Encounter (Signed)
Pt called stating [facility] discussed starting her on a new cholesterol medication at last visit but no Rx has been sent to pharmacy, please advise

## 2012-05-10 NOTE — Telephone Encounter (Signed)
Note reviewed - after results came back she was to return to discuss treatment options.  OK to start lipitor 20 mg with lab in 4 weeks if she feels ok and doesn't want to discuss meds.

## 2012-05-10 NOTE — Telephone Encounter (Signed)
Pt advised of Rx/pharmacy and follow up labs

## 2012-05-18 ENCOUNTER — Ambulatory Visit (INDEPENDENT_AMBULATORY_CARE_PROVIDER_SITE_OTHER): Payer: BC Managed Care – PPO

## 2012-05-18 DIAGNOSIS — N951 Menopausal and female climacteric states: Secondary | ICD-10-CM

## 2012-05-18 DIAGNOSIS — Z13820 Encounter for screening for osteoporosis: Secondary | ICD-10-CM

## 2012-08-21 ENCOUNTER — Encounter: Payer: Self-pay | Admitting: Gynecology

## 2012-08-21 ENCOUNTER — Ambulatory Visit (INDEPENDENT_AMBULATORY_CARE_PROVIDER_SITE_OTHER): Payer: Self-pay | Admitting: Gynecology

## 2012-08-21 VITALS — BP 136/90

## 2012-08-21 DIAGNOSIS — N63 Unspecified lump in unspecified breast: Secondary | ICD-10-CM

## 2012-08-21 DIAGNOSIS — N951 Menopausal and female climacteric states: Secondary | ICD-10-CM

## 2012-08-21 DIAGNOSIS — N6320 Unspecified lump in the left breast, unspecified quadrant: Secondary | ICD-10-CM

## 2012-08-21 NOTE — Patient Instructions (Addendum)
Menopause Menopause is the normal time of life when menstrual periods stop completely. Menopause is complete when you have missed 12 consecutive menstrual periods. It usually occurs between the ages of 48 to 55, with an average age of 51. Very rarely does a woman develop menopause before 58 years old. At menopause, your ovaries stop producing the female hormones, estrogen and progesterone. This can cause undesirable symptoms and also affect your health. Sometimes the symptoms may occur 4 to 5 years before the menopause begins. There is no relationship between menopause and:  Oral contraceptives.  Number of children you had.  Race.  The age your menstrual periods started (menarche). Heavy smokers and very thin women may develop menopause earlier in life. CAUSES  The ovaries stop producing the female hormones estrogen and progesterone.  Other causes include:  Surgery to remove both ovaries.  The ovaries stop functioning for no known reason.  Tumors of the pituitary gland in the brain.  Medical disease that affects the ovaries and hormone production.  Radiation treatment to the abdomen or pelvis.  Chemotherapy that affects the ovaries. SYMPTOMS   Hot flashes.  Night sweats.  Decrease in sex drive.  Vaginal dryness and thinning of the vagina causing painful intercourse.  Dryness of the skin and developing wrinkles.  Headaches.  Tiredness.  Irritability.  Memory problems.  Weight gain.  Bladder infections.  Hair growth of the face and chest.  Infertility. More serious symptoms include:  Loss of bone (osteoporosis) causing breaks (fractures).  Depression.  Hardening and narrowing of the arteries (atherosclerosis) causing heart attacks and strokes. DIAGNOSIS   When the menstrual periods have stopped for 12 straight months.  Physical exam.  Hormone studies of the blood. TREATMENT  There are many treatment choices and nearly as many questions about them.  The decisions to treat or not to treat menopausal changes is an individual choice made with your caregiver. Your caregiver can discuss the treatments with you. Together, you can decide which treatment will work best for you. Your treatment choices may include:   Hormone therapy (estorgen and progesterone).  Non-hormonal medications.  Treating the individual symptoms with medication (for example antidepressants for depression).  Herbal medications that may help specific symptoms.  Counseling by a psychiatrist or psychologist.  Group therapy.  Lifestyle changes including:  Eating healthy.  Regular exercise.  Limiting caffeine and alcohol.  Stress management and meditation.  No treatment. HOME CARE INSTRUCTIONS   Take the medication your caregiver gives you as directed.  Get plenty of sleep and rest.  Exercise regularly.  Eat a diet that contains calcium (good for the bones) and soy products (acts like estrogen hormone).  Avoid alcoholic beverages.  Do not smoke.  If you have hot flashes, dress in layers.  Take supplements, calcium and vitamin D to strengthen bones.  You can use over-the-counter lubricants or moisturizers for vaginal dryness.  Group therapy is sometimes very helpful.  Acupuncture may be helpful in some cases. SEEK MEDICAL CARE IF:   You are not sure you are in menopause.  You are having menopausal symptoms and need advice and treatment.  You are still having menstrual periods after age 55.  You have pain with intercourse.  Menopause is complete (no menstrual period for 12 months) and you develop vaginal bleeding.  You need a referral to a specialist (gynecologist, psychiatrist or psychologist) for treatment. SEEK IMMEDIATE MEDICAL CARE IF:   You have severe depression.  You have excessive vaginal bleeding.  You fell and   think you have a broken bone.  You have pain when you urinate.  You develop leg or chest pain.  You have a fast  pounding heart beat (palpitations).  You have severe headaches.  You develop vision problems.  You feel a lump in your breast.  You have abdominal pain or severe indigestion. Document Released: 09/04/2003 Document Revised: 09/06/2011 Document Reviewed: 04/11/2008 [employer] Patient Information 2013 [employer],    Menopause and Herbal Products Menopause is the normal time of life when menstrual periods stop completely. Menopause is complete when you have missed 12 consecutive menstrual periods. It usually occurs between the ages of 66 to 39, with an average age of 24. Very rarely does a woman develop menopause before 58 years old. At menopause, your ovaries stop producing the female hormones, estrogen and progesterone. This can cause undesirable symptoms and also affect your health. Sometimes the symptoms can occur 4 to 5 years before the menopause begins. There is no relationship between menopause and:  Oral contraceptives.  Number of children you had.  Race.  The age your menstrual periods started (menarche). Heavy smokers and very thin women may develop menopause earlier in life. Estrogen and progesterone hormone treatment is the usual method of treating menopausal symptoms. However, there are women who should not take hormone treatment. This is true of:   Women that have breast or uterine cancer.  Women who prefer not to take hormones because of certain side effects (abnormal uterine bleeding).  Women who are afraid that hormones may cause breast cancer.  Women who have a history of liver disease, heart disease, stroke, or blood clots. For these women, there are other medications that may help treat their menopausal symptoms. These medications are found in plants and botanical products. They can be found in the form of herbs, teas, oils, tinctures, and pills.  CAUSES:  The ovaries stop producing the female hormones estrogen and progesterone.  Other causes include:  Surgery to  remove both ovaries.  The ovaries stop functioning for no know reason.  Tumors of the pituitary gland in the brain.  Medical disease that affects the ovaries and hormone production.  Radiation treatment to the abdomen or pelvis.  Chemotherapy that affects the ovaries. PHYTOESTROGENS: Phytoestrogen's occur naturally in plants and plant products. They act like estrogen in the body. Herbal medications are made from these plants and botanical steroids. There are 3 types of phytoestrogens:  Isoflavones (genistein and daidzein) are found in soy, garbanzo beans, miso and tofu foods.  Ligins are found in the shell of seeds. They are used to make oils like flaxseed oil. The bacteria in your intestine act on these foods to produce the estrogen-like hormones.  Coumestans are estrogen-like. Some of the foods they are found in include sunflower seeds and bean sprouts. CONDITIONS AND THERE POSSIBLE HERBAL TREATMENT:  Hot flashes and night sweats.  Soy, black cohosh and evening primrose.  Irritability, insomnia, depression and memory problems.  Chasteberry, ginseng, and soy.  St. John's wort may be helpful for depression. However, there is a concern of it causing cataracts of the eye and may have bad effects on other medications. St. John's wort should not be taken for long time and without your caregiver's advice.  Loss of libido and vaginal and skin dryness.  Wild yam and soy.  Prevention of coronary heart disease and osteoporosis.  Soy and Isoflavones. Several studies have shown that some women benefit from herbal medications, but most of the studies have not consistently shown that  these supplements are much better than placebo. Other forms of treatment to help women with menopausal symptoms include a balanced diet, rest, exercise, vitamin and calcium (with vitamin D) supplements, acupuncture, and group therapy when necessary. THOSE WHO SHOULD NOT TAKE HERBAL MEDICATIONS INCLUDE:  Women  who are planning on getting pregnant unless told by your caregiver.  Women who are breastfeeding unless told by your caregiver.  Women who are taking other prescription medications unless told by your caregiver.  Infants, children, and elderly women unless told by your caregiver. Different herbal medications have different and unmeasured amounts of the herbal ingredients. There are no regulations, quality control, and standardization of the ingredients in herbal medications. Therefore, the amount of the ingredient in the medication may vary from one herb, pill, tea, oil or tincture to another. Many herbal medications can cause serious problems and can even have poisonous effects if taken too much or too long. If problems develop, the medication should be stopped and recorded by your caregiver. HOME CARE INSTRUCTIONS  Do not take or give children herbal medications without your caregiver's advice.  Let your caregiver know all the medications you are taking. This includes prescription, over-the-counter, eye drops, and creams.  Do not take herbal medications longer or more than recommended.  Tell your caregiver about any side effects from the medication. SEEK MEDICAL CARE IF:  You develop a fever of 102 F (38.9 C), or as directed by your caregiver.  You feel sick to your stomach (nauseous), vomit, or have diarrhea.  You develop a rash.  You develop abdominal pain.  You develop severe headaches.  You start to have vision problems.  You feel dizzy or faint.  You start to feel numbness in any part of your body.  You start shaking (have convulsions). Document Released: 12/01/2007 Document Revised: 09/06/2011 Document Reviewed: 06/30/2010 [facility] Patient Information 2013 [place], [place].

## 2012-08-21 NOTE — Progress Notes (Signed)
Patient presented to the office today concerned about a possible mass on her axillary region of her left breast. She stated she noticed this one week ago. She is currently exercising on a regular exercise especially upper arm extremities doing butterfly exercises as well. She denies any tenderness, nipple discharge or any recent trauma or injury. Patient had a normal mammogram September 2013. She stated her mother had history of breast cancer. Patient was also complaining since her last office visit in September of night sweats.  Exam: Both breasts were examined in sitting and supine position there were symmetrical in appearance no skin discoloration no nipple inversion no palpable masses or tenderness and no supraclavicular axillary lymphadenopathy.  Assessment/plan: Patient was reassured that no masses were palpated perhaps it was hypertrophy from the pectoralis muscle from her exercise. She was encouraged to do her monthly self breast examination. We discussed the menopause as well as potential need for hormone replacement therapy. Patient states that she would rather not and we'll try herbal supplements at the present time. Literature information was provided. We'll otherwise see her at the end of the year or when necessary.

## 2012-12-30 ENCOUNTER — Other Ambulatory Visit: Payer: Self-pay | Admitting: Internal Medicine

## 2013-03-07 ENCOUNTER — Other Ambulatory Visit: Payer: Self-pay

## 2013-03-07 DIAGNOSIS — Z1231 Encounter for screening mammogram for malignant neoplasm of breast: Secondary | ICD-10-CM

## 2013-04-09 ENCOUNTER — Ambulatory Visit: Payer: Self-pay

## 2013-04-10 ENCOUNTER — Other Ambulatory Visit: Payer: Self-pay | Admitting: Internal Medicine

## 2013-05-08 ENCOUNTER — Ambulatory Visit
Admission: RE | Admit: 2013-05-08 | Discharge: 2013-05-08 | Disposition: A | Discharge status: Home / Self Care | Payer: BC Managed Care – PPO | Source: Ambulatory Visit

## 2013-05-08 DIAGNOSIS — Z1231 Encounter for screening mammogram for malignant neoplasm of breast: Secondary | ICD-10-CM

## 2013-05-11 ENCOUNTER — Other Ambulatory Visit: Payer: Self-pay | Admitting: Gynecology

## 2013-05-11 DIAGNOSIS — R928 Other abnormal and inconclusive findings on diagnostic imaging of breast: Secondary | ICD-10-CM

## 2013-05-15 ENCOUNTER — Ambulatory Visit
Admission: RE | Admit: 2013-05-15 | Discharge: 2013-05-15 | Disposition: A | Discharge status: Home / Self Care | Payer: BC Managed Care – PPO | Source: Ambulatory Visit | Attending: Gynecology | Admitting: Gynecology

## 2013-05-15 ENCOUNTER — Other Ambulatory Visit: Payer: Self-pay | Admitting: *Deleted

## 2013-05-15 DIAGNOSIS — R928 Other abnormal and inconclusive findings on diagnostic imaging of breast: Secondary | ICD-10-CM

## 2013-05-15 DIAGNOSIS — N6310 Unspecified lump in the right breast, unspecified quadrant: Secondary | ICD-10-CM

## 2013-05-17 ENCOUNTER — Other Ambulatory Visit: Payer: BC Managed Care – PPO

## 2013-10-05 ENCOUNTER — Other Ambulatory Visit: Payer: Self-pay | Admitting: *Deleted

## 2013-10-05 MED ORDER — DILTIAZEM HCL ER COATED BEADS 240 MG PO CP24: 240.0000 mg | ORAL_CAPSULE | Freq: Every day | ORAL | Status: DC

## 2013-10-05 MED ORDER — LISINOPRIL-HYDROCHLOROTHIAZIDE 20-12.5 MG PO TABS: ORAL_TABLET | ORAL | Status: DC

## 2013-10-09 ENCOUNTER — Other Ambulatory Visit: Payer: Self-pay | Admitting: Gynecology

## 2013-10-09 DIAGNOSIS — N6310 Unspecified lump in the right breast, unspecified quadrant: Secondary | ICD-10-CM

## 2013-10-23 ENCOUNTER — Encounter: Payer: BC Managed Care – PPO | Admitting: Internal Medicine

## 2013-11-12 ENCOUNTER — Encounter: Payer: Self-pay | Admitting: Internal Medicine

## 2013-11-12 ENCOUNTER — Ambulatory Visit (INDEPENDENT_AMBULATORY_CARE_PROVIDER_SITE_OTHER): Payer: BC Managed Care – PPO | Admitting: Internal Medicine

## 2013-11-12 VITALS — BP 122/78 | HR 87 | Temp 98.5°F | Resp 13 | Wt 216.4 lb

## 2013-11-12 DIAGNOSIS — B009 Herpesviral infection, unspecified: Secondary | ICD-10-CM | POA: Insufficient documentation

## 2013-11-12 MED ORDER — GABAPENTIN 100 MG PO CAPS: ORAL_CAPSULE | ORAL | Status: DC

## 2013-11-12 MED ORDER — VALACYCLOVIR HCL 1 G PO TABS: 1000.0000 mg | ORAL_TABLET | Freq: Three times a day (TID) | ORAL | Status: DC

## 2013-11-12 NOTE — Assessment & Plan Note (Signed)
Valtrex 1 g tid Gabapentin 100 mg tid

## 2013-11-12 NOTE — Addendum Note (Signed)
Addended by: Shelly Coss on: 11/12/2013 08:35 AM   Modules accepted: Orders

## 2013-11-12 NOTE — Progress Notes (Signed)
   Subjective:    Patient ID: Tami Hughes, female    DOB: 1955/09/04, 58 y.o.   MRN: [id number]  HPI Burning , tingling in R thoracolumbar area in same area as herpes simplex outbreak in 08/2010 for which Valtrex was Rxed. Symptoms progressive over 4 weeks    Review of Systems Constitutional: No fever, chills, significant weight change, fatigue, weakness or night sweats Eyes: No redness, discharge, pain, blurred Genitourinary: No dysuria,hematuria, pyuria, frequency, urgency,  incontinence, nocturia, dark urine or flank pain Dermatologic: No rash, pruritus, urticaria, or change in color or temperature of skin    Objective:   Physical Exam General appearance is one of good health and nourishment w/o distress.  Eyes: No conjunctival inflammation or scleral icterus is present.  Oral exam: Dental hygiene is good; lips and gums are healthy appearing.There is no oropharyngeal erythema or exudate noted.   Heart:  Normal rate and regular rhythm. S1 and S2 normal without gallop, murmur, click, rub or other extra sounds     Lungs:Chest clear to auscultation; no wheezes, rhonchi,rales ,or rubs present.No increased work of breathing.   Abdomen: bowel sounds normal, soft and non-tender without masses, organomegaly or hernias noted.  No guarding or rebound . No tenderness over the flanks to percussion  Musculoskeletal: Able to lie flat and sit up without help. Negative straight leg raising bilaterally. Gait normal  Skin:Warm & dry.  Intact without suspicious lesions or rashes ; no jaundice or tenting  Lymphatic: No lymphadenopathy is noted about the head, neck, axilla                Assessment & Plan:  See Current Assessment & Plan in Problem List under specific Diagnosis

## 2013-11-12 NOTE — Progress Notes (Signed)
Pre visit review using our clinic review tool, if applicable. No additional management support is needed unless otherwise documented below in the visit note. 

## 2013-11-12 NOTE — Addendum Note (Signed)
Addended by: Shelly Coss on: 11/12/2013 08:34 AM   Modules accepted: Orders

## 2013-11-12 NOTE — Patient Instructions (Signed)
Assess response to the gabapentin one every 8 hours as needed. If it is partially beneficial, it can be increased up to a total of 3 pills every 8 hours as needed. This increase of 1 pill each dose  should take place over 72 hours at least. 

## 2013-11-20 ENCOUNTER — Ambulatory Visit
Admission: RE | Admit: 2013-11-20 | Discharge: 2013-11-20 | Disposition: A | Discharge status: Home / Self Care | Payer: BC Managed Care – PPO | Source: Ambulatory Visit | Attending: Gynecology | Admitting: Gynecology

## 2013-11-20 DIAGNOSIS — N6310 Unspecified lump in the right breast, unspecified quadrant: Secondary | ICD-10-CM

## 2013-12-04 ENCOUNTER — Ambulatory Visit (INDEPENDENT_AMBULATORY_CARE_PROVIDER_SITE_OTHER): Payer: BC Managed Care – PPO | Admitting: Internal Medicine

## 2013-12-04 ENCOUNTER — Encounter: Payer: Self-pay | Admitting: Internal Medicine

## 2013-12-04 VITALS — BP 132/80 | HR 81 | Temp 98.6°F | Ht 65.5 in | Wt 217.8 lb

## 2013-12-04 DIAGNOSIS — E669 Obesity, unspecified: Secondary | ICD-10-CM

## 2013-12-04 DIAGNOSIS — Z0000 Encounter for general adult medical examination without abnormal findings: Secondary | ICD-10-CM

## 2013-12-04 DIAGNOSIS — I10 Essential (primary) hypertension: Secondary | ICD-10-CM

## 2013-12-04 DIAGNOSIS — E785 Hyperlipidemia, unspecified: Secondary | ICD-10-CM

## 2013-12-04 MED ORDER — DILTIAZEM HCL ER 240 MG PO CP24: 240.0000 mg | ORAL_CAPSULE | Freq: Every day | ORAL | Status: DC

## 2013-12-04 MED ORDER — ATORVASTATIN CALCIUM 20 MG PO TABS: 20.0000 mg | ORAL_TABLET | Freq: Every day | ORAL | Status: DC

## 2013-12-04 MED ORDER — LISINOPRIL-HYDROCHLOROTHIAZIDE 20-12.5 MG PO TABS: ORAL_TABLET | ORAL | Status: DC

## 2013-12-04 MED ORDER — RANITIDINE HCL 150 MG PO CAPS: 150.0000 mg | ORAL_CAPSULE | ORAL | Status: DC | PRN

## 2013-12-04 MED ORDER — PHENTERMINE-TOPIRAMATE 3.75-23 MG PO CP24
1.0000 | ORAL_CAPSULE | Freq: Every day | ORAL | Status: DC
Start: 2013-12-04 — End: 2013-12-26

## 2013-12-04 NOTE — Patient Instructions (Addendum)
It was good to see you today.  We have reviewed your prior records including labs and tests today  Health Maintenance reviewed - all recommended immunizations and age-appropriate screenings are up-to-date.  Test(s) ordered today. Return when you are fasting. Your results will be released to [place] (or called to you) after review, usually within 72hours after test completion. If any changes need to be made, you will be notified at that same time.  Medications reviewed and updated Trial Qysmia for weight loss x 2 weeks, then increase as tolerated No other changes recommended at this time.  Your prescription(s) have been submitted to your pharmacy (or given to you). Please take as directed and contact our office if you believe you are having problem(s) with the medication(s).  Please schedule followup in 3 months for weight check, call sooner if problems.  Health Maintenance, Female A healthy lifestyle and preventative care can promote health and wellness.  Maintain regular health, dental, and eye exams.  Eat a healthy diet. Foods like vegetables, fruits, whole grains, low-fat dairy products, and lean protein foods contain the nutrients you need without too many calories. Decrease your intake of foods high in solid fats, added sugars, and salt. Get information about a proper diet from your caregiver, if necessary.  Regular physical exercise is one of the most important things you can do for your health. Most adults should get at least 150 minutes of moderate-intensity exercise (any activity that increases your heart rate and causes you to sweat) each week. In addition, most adults need muscle-strengthening exercises on 2 or more days a week.   Maintain a healthy weight. The body mass index (BMI) is a screening tool to identify possible weight problems. It provides an estimate of body fat based on height and weight. Your caregiver can help determine your BMI, and can help you achieve or  maintain a healthy weight. For adults 20 years and older:  A BMI below 18.5 is considered underweight.  A BMI of 18.5 to 24.9 is normal.  A BMI of 25 to 29.9 is considered overweight.  A BMI of 30 and above is considered obese.  Maintain normal blood lipids and cholesterol by exercising and minimizing your intake of saturated fat. Eat a balanced diet with plenty of fruits and vegetables. Blood tests for lipids and cholesterol should begin at age 44 and be repeated every 5 years. If your lipid or cholesterol levels are high, you are over 50, or you are a high risk for heart disease, you may need your cholesterol levels checked more frequently.Ongoing high lipid and cholesterol levels should be treated with medicines if diet and exercise are not effective.  If you smoke, find out from your caregiver how to quit. If you do not use tobacco, do not start.  Lung cancer screening is recommended for adults aged 64 80 years who are at high risk for developing lung cancer because of a history of smoking. Yearly low-dose computed tomography (CT) is recommended for people who have at least a 30-pack-year history of smoking and are a current smoker or have quit within the past 15 years. A pack year of smoking is smoking an average of 1 pack of cigarettes a day for 1 year (for example: 1 pack a day for 30 years or 2 packs a day for 15 years). Yearly screening should continue until the smoker has stopped smoking for at least 15 years. Yearly screening should also be stopped for people who develop a health problem  that would prevent them from having lung cancer treatment.  If you are pregnant, do not drink alcohol. If you are breastfeeding, be very cautious about drinking alcohol. If you are not pregnant and choose to drink alcohol, do not exceed 1 drink per day. One drink is considered to be 12 ounces (355 mL) of beer, 5 ounces (148 mL) of wine, or 1.5 ounces (44 mL) of liquor.  Avoid use of street drugs. Do not  share needles with anyone. Ask for help if you need support or instructions about stopping the use of drugs.  High blood pressure causes heart disease and increases the risk of stroke. Blood pressure should be checked at least every 1 to 2 years. Ongoing high blood pressure should be treated with medicines, if weight loss and exercise are not effective.  If you are 16 to 59 years old, ask your caregiver if you should take aspirin to prevent strokes.  Diabetes screening involves taking a blood sample to check your fasting blood sugar level. This should be done once every 3 years, after age 26, if you are within normal weight and without risk factors for diabetes. Testing should be considered at a younger age or be carried out more frequently if you are overweight and have at least 1 risk factor for diabetes.  Breast cancer screening is essential preventative care for women. You should practice "breast self-awareness." This means understanding the normal appearance and feel of your breasts and may include breast self-examination. Any changes detected, no matter how small, should be reported to a caregiver. Women in their 73s and 30s should have a clinical breast exam (CBE) by a caregiver as part of a regular health exam every 1 to 3 years. After age 73, women should have a CBE every year. Starting at age 49, women should consider having a mammogram (breast X-ray) every year. Women who have a family history of breast cancer should talk to their caregiver about genetic screening. Women at a high risk of breast cancer should talk to their caregiver about having an MRI and a mammogram every year.  Breast cancer gene (BRCA)-related cancer risk assessment is recommended for women who have family members with BRCA-related cancers. BRCA-related cancers include breast, ovarian, tubal, and peritoneal cancers. Having family members with these cancers may be associated with an increased risk for harmful changes  (mutations) in the breast cancer genes BRCA1 and BRCA2. Results of the assessment will determine the need for genetic counseling and BRCA1 and BRCA2 testing.  The Pap test is a screening test for cervical cancer. Women should have a Pap test starting at age 18. Between ages 49 and 71, Pap tests should be repeated every 2 years. Beginning at age 19, you should have a Pap test every 3 years as long as the past 3 Pap tests have been normal. If you had a hysterectomy for a problem that was not cancer or a condition that could lead to cancer, then you no longer need Pap tests. If you are between ages 85 and 29, and you have had normal Pap tests going back 10 years, you no longer need Pap tests. If you have had past treatment for cervical cancer or a condition that could lead to cancer, you need Pap tests and screening for cancer for at least 20 years after your treatment. If Pap tests have been discontinued, risk factors (such as a new sexual partner) need to be reassessed to determine if screening should be resumed. Some women  have medical problems that increase the chance of getting cervical cancer. In these cases, your caregiver may recommend more frequent screening and Pap tests.  The human papillomavirus (HPV) test is an additional test that may be used for cervical cancer screening. The HPV test looks for the virus that can cause the cell changes on the cervix. The cells collected during the Pap test can be tested for HPV. The HPV test could be used to screen women aged 66 years and older, and should be used in women of any age who have unclear Pap test results. After the age of 22, women should have HPV testing at the same frequency as a Pap test.  Colorectal cancer can be detected and often prevented. Most routine colorectal cancer screening begins at the age of 71 and continues through age 30. However, your caregiver may recommend screening at an earlier age if you have risk factors for colon cancer. On a  yearly basis, your caregiver may provide home test kits to check for hidden blood in the stool. Use of a small camera at the end of a tube, to directly examine the colon (sigmoidoscopy or colonoscopy), can detect the earliest forms of colorectal cancer. Talk to your caregiver about this at age 62, when routine screening begins. Direct examination of the colon should be repeated every 5 to 10 years through age 49, unless early forms of pre-cancerous polyps or small growths are found.  Hepatitis C blood testing is recommended for all people born from [birth year] through 1966 and any individual with known risks for hepatitis C.  Practice safe sex. Use condoms and avoid high-risk sexual practices to reduce the spread of sexually transmitted infections (STIs). Sexually active women aged 42 and younger should be checked for Chlamydia, which is a common sexually transmitted infection. Older women with new or multiple partners should also be tested for Chlamydia. Testing for other STIs is recommended if you are sexually active and at increased risk.  Osteoporosis is a disease in which the bones lose minerals and strength with aging. This can result in serious bone fractures. The risk of osteoporosis can be identified using a bone density scan. Women ages 47 and over and women at risk for fractures or osteoporosis should discuss screening with their caregivers. Ask your caregiver whether you should be taking a calcium supplement or vitamin D to reduce the rate of osteoporosis.  Menopause can be associated with physical symptoms and risks. Hormone replacement therapy is available to decrease symptoms and risks. You should talk to your caregiver about whether hormone replacement therapy is right for you.  Use sunscreen. Apply sunscreen liberally and repeatedly throughout the day. You should seek shade when your shadow is shorter than you. Protect yourself by wearing long sleeves, pants, a wide-brimmed hat, and sunglasses  year round, whenever you are outdoors.  Notify your caregiver of new moles or changes in moles, especially if there is a change in shape or color. Also notify your caregiver if a mole is larger than the size of a pencil eraser.  Stay current with your immunizations. Document Released: 12/28/2010 Document Revised: 10/09/2012 Document Reviewed: 12/28/2010 [facility] Patient Information 2014 [place]. Exercise to Lose Weight Exercise and a healthy diet may help you lose weight. Your doctor may suggest specific exercises. EXERCISE IDEAS AND TIPS  Choose low-cost things you enjoy doing, such as walking, bicycling, or exercising to workout videos.  Take stairs instead of the elevator.  Walk during your lunch break.  Park  your car further away from work or school.  Go to a gym or an exercise class.  Start with 5 to 10 minutes of exercise each day. Build up to 30 minutes of exercise 4 to 6 days a week.  Wear shoes with good support and comfortable clothes.  Stretch before and after working out.  Work out until you breathe harder and your heart beats faster.  Drink extra water when you exercise.  Do not do so much that you hurt yourself, feel dizzy, or get very short of breath. Exercises that burn about 150 calories:  Running 1  miles in 15 minutes.  Playing volleyball for 45 to 60 minutes.  Washing and waxing a car for 45 to 60 minutes.  Playing touch football for 45 minutes.  Walking 1  miles in 35 minutes.  Pushing a stroller 1  miles in 30 minutes.  Playing basketball for 30 minutes.  Raking leaves for 30 minutes.  Bicycling 5 miles in 30 minutes.  Walking 2 miles in 30 minutes.  Dancing for 30 minutes.  Shoveling snow for 15 minutes.  Swimming laps for 20 minutes.  Walking up stairs for 15 minutes.  Bicycling 4 miles in 15 minutes.  Gardening for 30 to 45 minutes.  Jumping rope for 15 minutes.  Washing windows or floors for 45 to 60  minutes. Document Released: 07/17/2010 Document Revised: 09/06/2011 Document Reviewed: 07/17/2010 [facility] Patient Information 2014 [place], [place].

## 2013-12-04 NOTE — Progress Notes (Signed)
Pre visit review using our clinic review tool, if applicable. No additional management support is needed unless otherwise documented below in the visit note. 

## 2013-12-04 NOTE — Assessment & Plan Note (Signed)
Patient declined to begin statin as prescribed November 2013 Discussed risk and benefits of potential treatment versus continued attention to weight reduction through diet exercise Recheck lipids now with annual labs Reconsider need for statin while reducing weight

## 2013-12-04 NOTE — Progress Notes (Signed)
Subjective:    Patient ID: Tami Hughes, female    DOB: 1955-08-09, 58 y.o.   MRN: [id number]  HPI  Patient is here to establish with new PCP - retirement of MEN patient is here today for annual physical. Patient feels well and has no complaints. Also reviewed chronic medical issues and interval medical events: HTN, chol, GERD, obesity  Past Medical History  Diagnosis Date  . Hypertension   . Polyp of colon 2009    adenoma benign  . Dyslipidemia   . Shingles     recurrent: 08/2010, 10/2013   Family History  Problem Relation Age of Onset  . Kidney disease Mother     had transplant  . Breast cancer Mother   . Colon cancer Neg Hx   . Esophageal cancer Neg Hx   . Rectal cancer Neg Hx   . Stomach cancer Neg Hx   . Diabetes Neg Hx   . Hyperlipidemia Neg Hx   . Heart disease Neg Hx   . COPD Neg Hx    History  Substance Use Topics  . Smoking status: Never Smoker   . Smokeless tobacco: Never Used  . Alcohol Use: 1.0 oz/week    2 drink(s) per week     Comment: occasionally - wine    Review of Systems  Constitutional: Negative for fatigue and unexpected weight change.  Respiratory: Negative for cough, shortness of breath and wheezing.   Cardiovascular: Negative for chest pain, palpitations and leg swelling.  Gastrointestinal: Negative for nausea, abdominal pain and diarrhea.  Neurological: Negative for dizziness, weakness, light-headedness and headaches.  Psychiatric/Behavioral: Negative for dysphoric mood. The patient is not nervous/anxious.   All other systems reviewed and are negative.      Objective:   Physical Exam  BP 132/80  Pulse 81  Temp(Src) 98.6 F (37 C) (Oral)  Ht 5' 5.5" (1.664 m)  Wt 217 lb 12.8 oz (98.793 kg)  BMI 35.68 kg/m2  SpO2 97% Wt Readings from Last 3 Encounters:  12/04/13 217 lb 12.8 oz (98.793 kg)  11/12/13 216 lb 6.4 oz (98.158 kg)  05/03/12 216 lb (97.977 kg)   Constitutional: She is overweight, but appears well-developed and  well-nourished. No distress.  HENT: Head: Normocephalic and atraumatic. Ears: B TMs ok, no erythema or effusion; Nose: Nose normal. Mouth/Throat: Oropharynx is clear and moist. No oropharyngeal exudate.  Eyes: Conjunctivae and EOM are normal. Pupils are equal, round, and reactive to light. No scleral icterus.  Neck: Normal range of motion. Neck supple. No JVD present. No thyromegaly present.  Cardiovascular: Normal rate, regular rhythm and normal heart sounds.  No murmur heard. No BLE edema. Pulmonary/Chest: Effort normal and breath sounds normal. No respiratory distress. She has no wheezes.  Abdominal: Soft. Bowel sounds are normal. She exhibits no distension. There is no tenderness. no masses GU/breast: defer to gyn Musculoskeletal: Normal range of motion, no joint effusions. No gross deformities Neurological: She is alert and oriented to person, place, and time. No cranial nerve deficit. Coordination, balance, strength, speech and gait are normal.  Skin: Skin is warm and dry. No rash noted. No erythema.  Psychiatric: She has a normal mood and affect. Her behavior is normal. Judgment and thought content normal.    Lab Results  Component Value Date   WBC 7.1 07/23/2010   HGB 13.4 07/23/2010   HCT 39.2 07/23/2010   PLT 237.0 07/23/2010   GLUCOSE 93 05/02/2012   CHOL 266* 05/02/2012   TRIG 178.0* 05/02/2012  HDL 52.80 05/02/2012   LDLDIRECT 179.1 05/02/2012   ALT 31 05/02/2012   ALT 31 05/02/2012   AST 25 05/02/2012   AST 25 05/02/2012   NA 138 05/02/2012   K 3.4* 05/02/2012   CL 101 05/02/2012   CREATININE 1.0 05/02/2012   BUN 11 05/02/2012   CO2 30 05/02/2012   TSH 1.51 07/23/2010    Mm Digital Diagnostic Unilat R  11/20/2013   CLINICAL DATA:  Six-month follow-up of a probably benign right breast mass.  EXAM: DIGITAL DIAGNOSTIC  RIGHT MAMMOGRAM WITH CAD  ULTRASOUND RIGHT BREAST  COMPARISON:  Previous exams.  ACR Breast Density Category b: There are scattered areas of fibroglandular density.   FINDINGS: No suspicious masses or calcifications are seen in the right breast. There is a stable mass in the central to slightly lower right breast with the suggestion of internal fat density measuring approximately 9 mm.  Mammographic images were processed with CAD.  Physical examination of the central lower right breast does not reveal any palpable masses.  Targeted ultrasound of the right breast was performed demonstrating a mixed echogenicity mass at 6 o'clock 4 cm from the nipple likely representing a cluster of cysts or an intramammary lymph node measuring 0.5 x 0.3 x 0.7 cm, unchanged given differences in imaging and measurement technique.  IMPRESSION: Stable probably benign right breast mass at 6 o'clock.  RECOMMENDATION: Bilateral diagnostic mammography in 6 months with right breast ultrasound to demonstrate 1 year stability of the probably benign right breast mass.  I have discussed the findings and recommendations with the patient. Results were also provided in writing at the conclusion of the visit. If applicable, a reminder letter will be sent to the patient regarding the next appointment.  BI-RADS CATEGORY  3: Probably benign.   Electronically Signed   By: Everlean Alstrom M.D.   On: 11/20/2013 09:20   US Breast Ltd Uni Right Inc Axilla  11/20/2013   CLINICAL DATA:  Six-month follow-up of a probably benign right breast mass.  EXAM: DIGITAL DIAGNOSTIC  RIGHT MAMMOGRAM WITH CAD  ULTRASOUND RIGHT BREAST  COMPARISON:  Previous exams.  ACR Breast Density Category b: There are scattered areas of fibroglandular density.  FINDINGS: No suspicious masses or calcifications are seen in the right breast. There is a stable mass in the central to slightly lower right breast with the suggestion of internal fat density measuring approximately 9 mm.  Mammographic images were processed with CAD.  Physical examination of the central lower right breast does not reveal any palpable masses.  Targeted ultrasound of the  right breast was performed demonstrating a mixed echogenicity mass at 6 o'clock 4 cm from the nipple likely representing a cluster of cysts or an intramammary lymph node measuring 0.5 x 0.3 x 0.7 cm, unchanged given differences in imaging and measurement technique.  IMPRESSION: Stable probably benign right breast mass at 6 o'clock.  RECOMMENDATION: Bilateral diagnostic mammography in 6 months with right breast ultrasound to demonstrate 1 year stability of the probably benign right breast mass.  I have discussed the findings and recommendations with the patient. Results were also provided in writing at the conclusion of the visit. If applicable, a reminder letter will be sent to the patient regarding the next appointment.  BI-RADS CATEGORY  3: Probably benign.   Electronically Signed   By: Everlean Alstrom M.D.   On: 11/20/2013 09:20   ECG: NSR @ 83 bpm - no ischemic change or arrythmia    Assessment &  Plan:   CPX/v70.0 - Patient has been counseled on age-appropriate routine health concerns for screening and prevention. These are reviewed and up-to-date. Immunizations are up-to-date or declined. Labs and ECG reviewed.  Problem List Items Addressed This Visit   HYPERLIPIDEMIA     Patient declined to begin statin as prescribed November 2013 Discussed risk and benefits of potential treatment versus continued attention to weight reduction through diet exercise Recheck lipids now with annual labs Reconsider need for statin while reducing weight    Relevant Medications      lisinopril-hydrochlorothiazide (PRINZIDE,ZESTORETIC) 20-12.5 MG per tablet      diltiazem (DILACOR XR) 24 hr capsule   HYPERTENSION      BP Readings from Last 3 Encounters:  12/04/13 132/80  11/12/13 122/78  08/21/12 136/90   The current medical regimen is effective;  continue present plan and medications.     Relevant Medications      lisinopril-hydrochlorothiazide (PRINZIDE,ZESTORETIC) 20-12.5 MG per tablet      diltiazem  (DILACOR XR) 24 hr capsule   Other Relevant Orders      EKG 12-Lead (Completed)   Obesity, Class II, BMI 35-39.9      Wt Readings from Last 3 Encounters:  12/04/13 217 lb 12.8 oz (98.793 kg)  11/12/13 216 lb 6.4 oz (98.158 kg)  05/03/12 216 lb (97.977 kg)   The patient is asked to make an attempt to improve diet and exercise patterns to aid in medical management of this problem.  Trial Qysmia if approved by insurance Note obesity-related health conditions include osteoarthritis of the knees, dyslipidemia and hypertension Followup monthly for weight check prior to renewal and office visit with me in 3 months to recheck we reviewed potential risk/benefit and possible side effects - pt understands and agrees to same      Relevant Medications      Phentermine-Topiramate (QSYMIA) 3.75-23 MG CP24    Other Visit Diagnoses   Routine general medical examination at a health care facility    -  Primary    Relevant Orders       Basic metabolic panel       CBC with Differential       Hepatic function panel       Lipid panel       TSH       Urinalysis, Routine w reflex microscopic

## 2013-12-04 NOTE — Assessment & Plan Note (Signed)
Wt Readings from Last 3 Encounters:  12/04/13 217 lb 12.8 oz (98.793 kg)  11/12/13 216 lb 6.4 oz (98.158 kg)  05/03/12 216 lb (97.977 kg)   The patient is asked to make an attempt to improve diet and exercise patterns to aid in medical management of this problem.  Trial Qysmia if approved by insurance Note obesity-related health conditions include osteoarthritis of the knees, dyslipidemia and hypertension Followup monthly for weight check prior to renewal and office visit with me in 3 months to recheck we reviewed potential risk/benefit and possible side effects - pt understands and agrees to same

## 2013-12-04 NOTE — Assessment & Plan Note (Signed)
BP Readings from Last 3 Encounters:  12/04/13 132/80  11/12/13 122/78  08/21/12 136/90   The current medical regimen is effective;  continue present plan and medications.

## 2013-12-05 ENCOUNTER — Other Ambulatory Visit (INDEPENDENT_AMBULATORY_CARE_PROVIDER_SITE_OTHER): Payer: BC Managed Care – PPO

## 2013-12-05 ENCOUNTER — Telehealth: Payer: Self-pay | Admitting: Internal Medicine

## 2013-12-05 DIAGNOSIS — Z0000 Encounter for general adult medical examination without abnormal findings: Secondary | ICD-10-CM

## 2013-12-05 LAB — URINALYSIS, ROUTINE W REFLEX MICROSCOPIC
Bilirubin Urine: NEGATIVE
HGB URINE DIPSTICK: NEGATIVE
Ketones, ur: NEGATIVE
Leukocytes, UA: NEGATIVE
NITRITE: NEGATIVE
PH: 6.5 (ref 5.0–8.0)
SPECIFIC GRAVITY, URINE: 1.01 (ref 1.000–1.030)
Total Protein, Urine: NEGATIVE
URINE GLUCOSE: NEGATIVE
Urobilinogen, UA: 0.2 (ref 0.0–1.0)

## 2013-12-05 LAB — CBC WITH DIFFERENTIAL/PLATELET
BASOS PCT: 0.5 % (ref 0.0–3.0)
Basophils Absolute: 0 10*3/uL (ref 0.0–0.1)
EOS PCT: 2.2 % (ref 0.0–5.0)
Eosinophils Absolute: 0.2 10*3/uL (ref 0.0–0.7)
HEMATOCRIT: 39.3 % (ref 36.0–46.0)
Hemoglobin: 12.7 g/dL (ref 12.0–15.0)
LYMPHS ABS: 4 10*3/uL (ref 0.7–4.0)
Lymphocytes Relative: 56.6 % — ABNORMAL HIGH (ref 12.0–46.0)
MCHC: 32.4 g/dL (ref 30.0–36.0)
MCV: 92 fl (ref 78.0–100.0)
MONOS PCT: 5.4 % (ref 3.0–12.0)
Monocytes Absolute: 0.4 10*3/uL (ref 0.1–1.0)
Neutro Abs: 2.5 10*3/uL (ref 1.4–7.7)
Neutrophils Relative %: 35.3 % — ABNORMAL LOW (ref 43.0–77.0)
Platelets: 239 10*3/uL (ref 150.0–400.0)
RBC: 4.27 Mil/uL (ref 3.87–5.11)
RDW: 14 % (ref 11.5–15.5)
WBC: 7.1 10*3/uL (ref 4.0–10.5)

## 2013-12-05 LAB — LIPID PANEL
CHOL/HDL RATIO: 4
CHOLESTEROL: 250 mg/dL — AB (ref 0–200)
HDL: 57.3 mg/dL (ref >39.00)
LDL CALC: 164 mg/dL — AB (ref 0–99)
NonHDL: 192.7
Triglycerides: 143 mg/dL (ref 0.0–149.0)
VLDL: 28.6 mg/dL (ref 0.0–40.0)

## 2013-12-05 LAB — BASIC METABOLIC PANEL
BUN: 11 mg/dL (ref 6–23)
CHLORIDE: 105 meq/L (ref 96–112)
CO2: 29 mEq/L (ref 19–32)
Calcium: 9.6 mg/dL (ref 8.4–10.5)
Creatinine, Ser: 1 mg/dL (ref 0.4–1.2)
GFR: 77.47 mL/min (ref >60.00)
Glucose, Bld: 97 mg/dL (ref 70–99)
POTASSIUM: 4.4 meq/L (ref 3.5–5.1)
SODIUM: 141 meq/L (ref 135–145)

## 2013-12-05 LAB — HEPATIC FUNCTION PANEL
ALT: 28 U/L (ref 0–35)
AST: 19 U/L (ref 0–37)
Albumin: 4 g/dL (ref 3.5–5.2)
Alkaline Phosphatase: 69 U/L (ref 39–117)
BILIRUBIN DIRECT: 0.1 mg/dL (ref 0.0–0.3)
BILIRUBIN TOTAL: 0.7 mg/dL (ref 0.2–1.2)
TOTAL PROTEIN: 6.7 g/dL (ref 6.0–8.3)

## 2013-12-05 LAB — TSH: TSH: 1.61 u[IU]/mL (ref 0.35–4.50)

## 2013-12-05 NOTE — Telephone Encounter (Signed)
Relevant patient education assigned to patient using Emmi. ° °

## 2013-12-12 ENCOUNTER — Encounter: Payer: Self-pay | Admitting: Internal Medicine

## 2013-12-22 IMAGING — US US ABDOMEN COMPLETE
1 series · 14 of 25 positions shown · non-contrast
Comparison: None.

CLINICAL DATA: Epigastric pain 3 weeks.

EXAM:
ULTRASOUND ABDOMEN COMPLETE

[Series 1: us abdomen complete · 0.23mm/px · 14 of 105 slices shown]
[im 1/105]
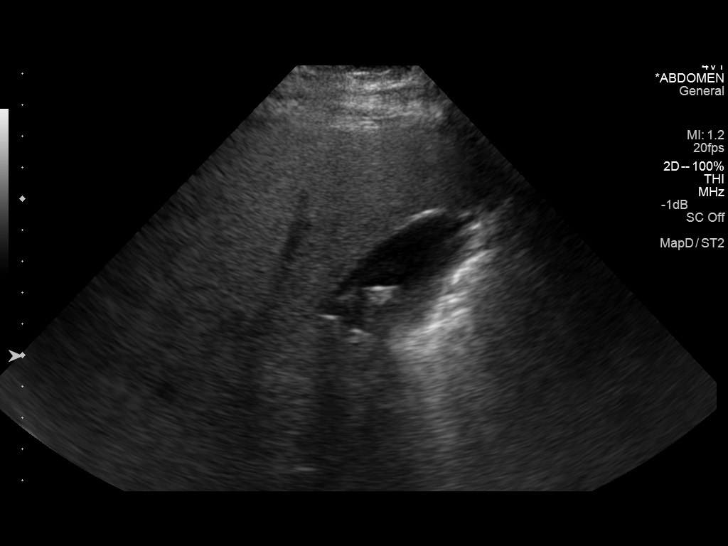
[im 9/105]
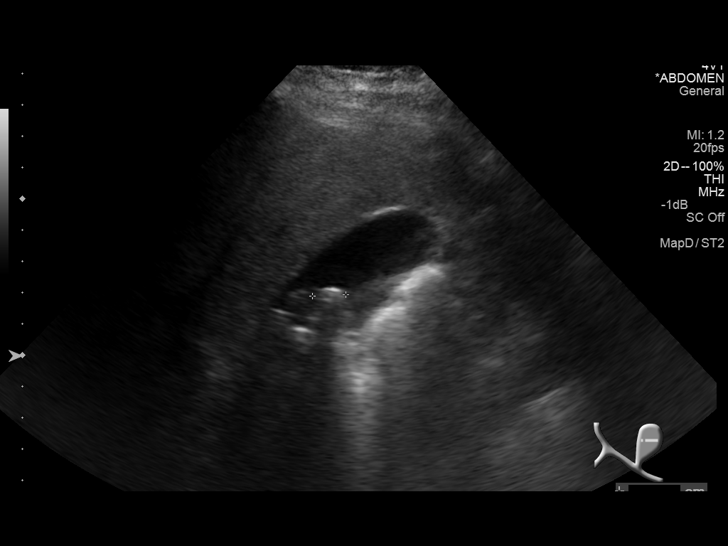
[im 18/105]
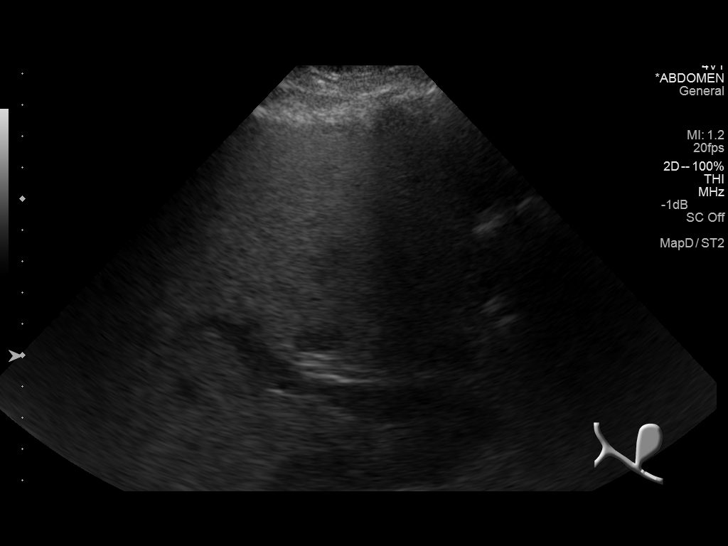
[im 27/105]
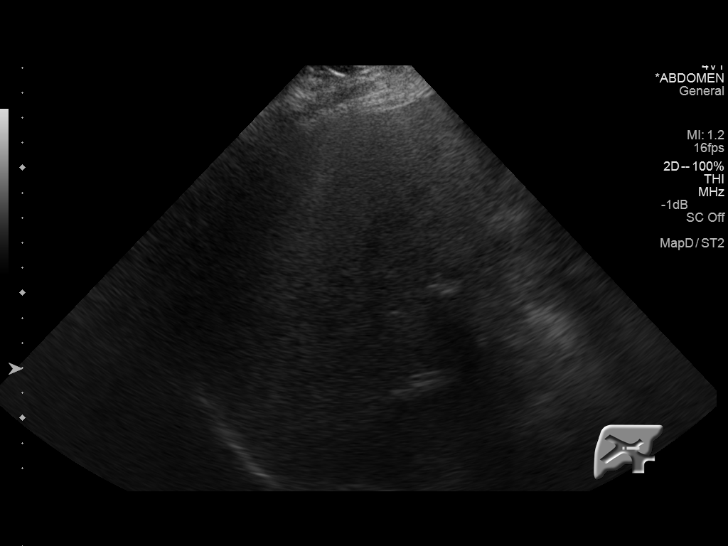
[im 35/105]
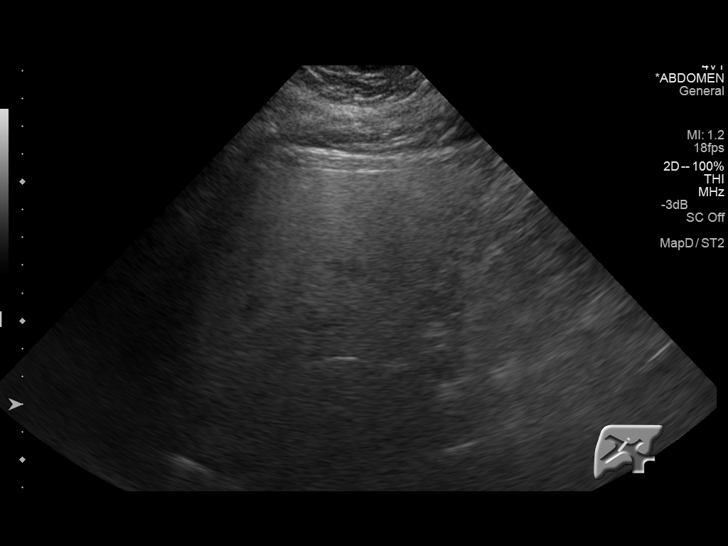
[im 40/105]
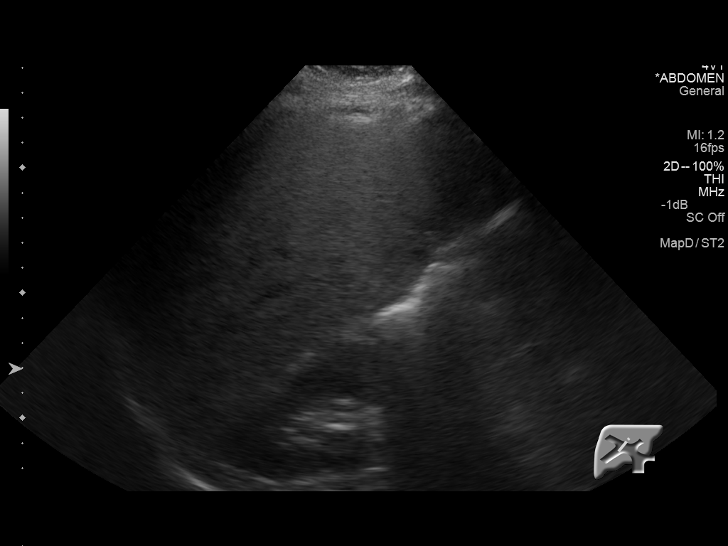
[im 48/105]
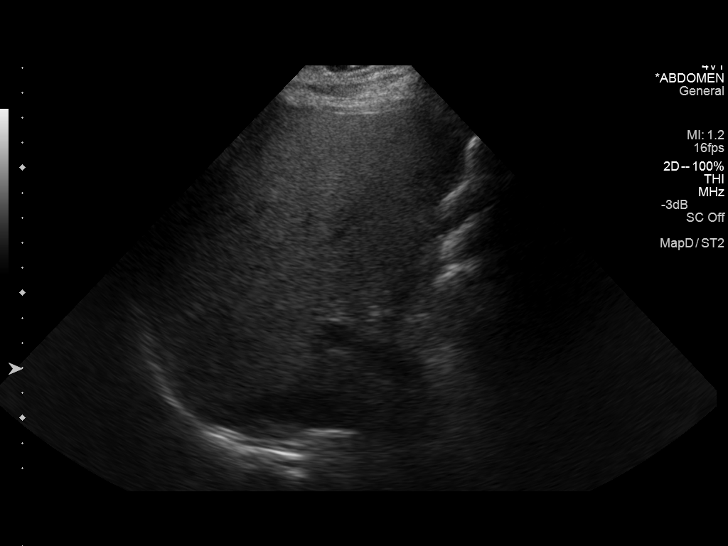
[im 57/105]
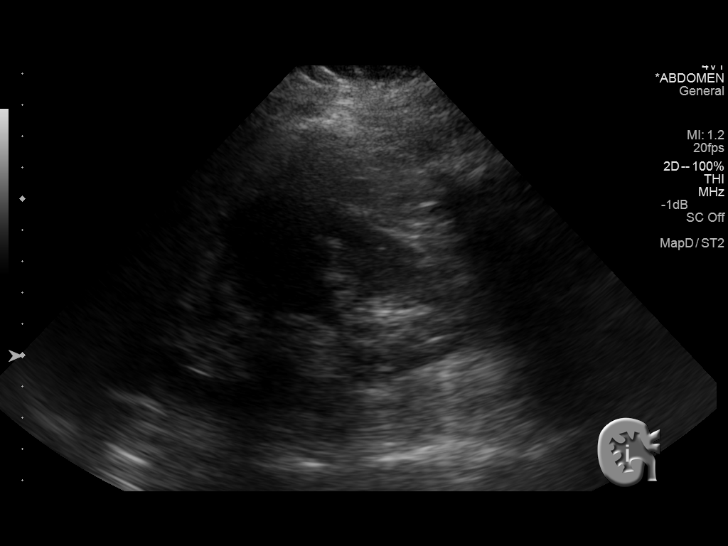
[im 66/105]
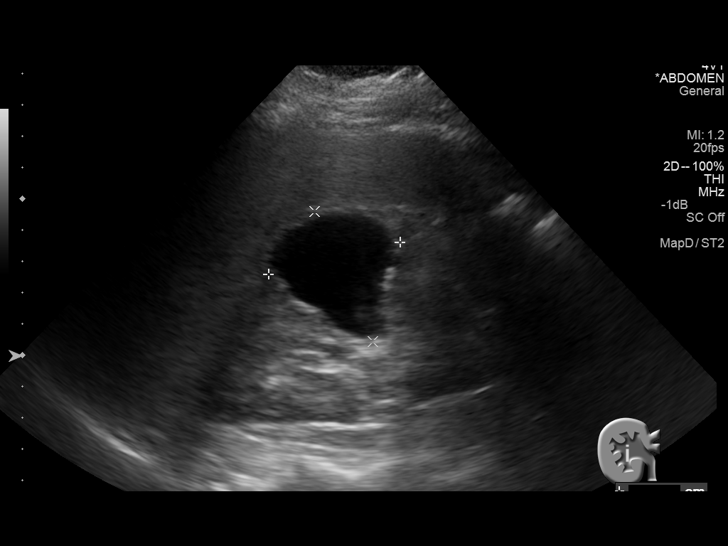
[im 70/105]
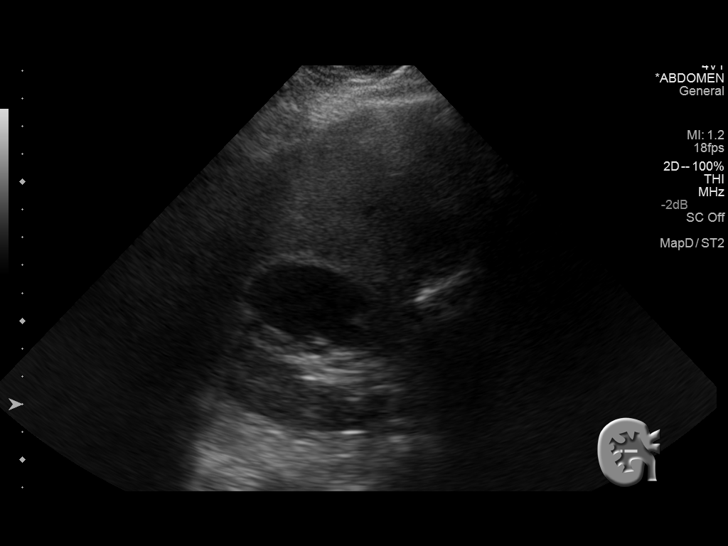
[im 79/105]
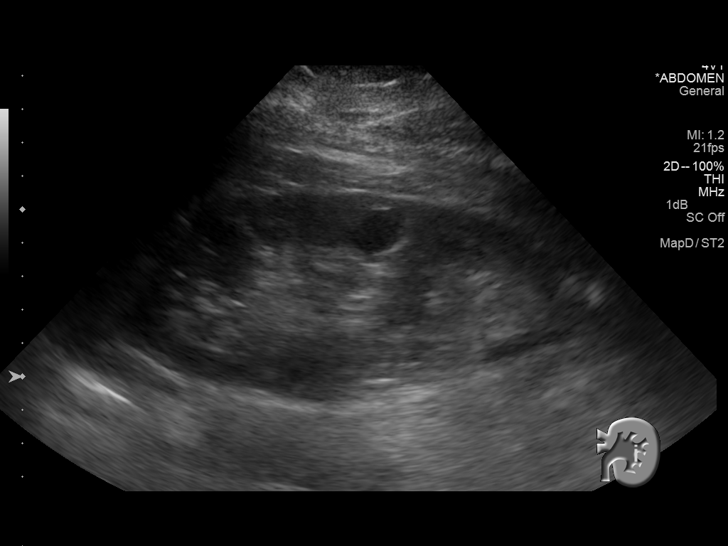
[im 87/105]
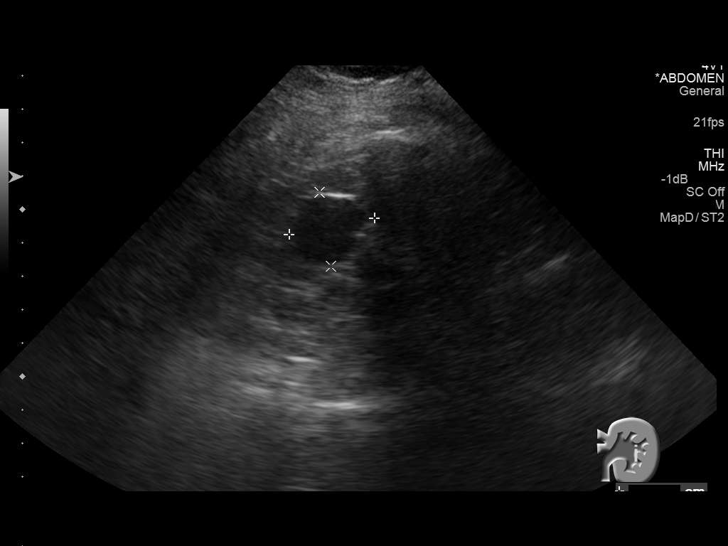
[im 96/105]
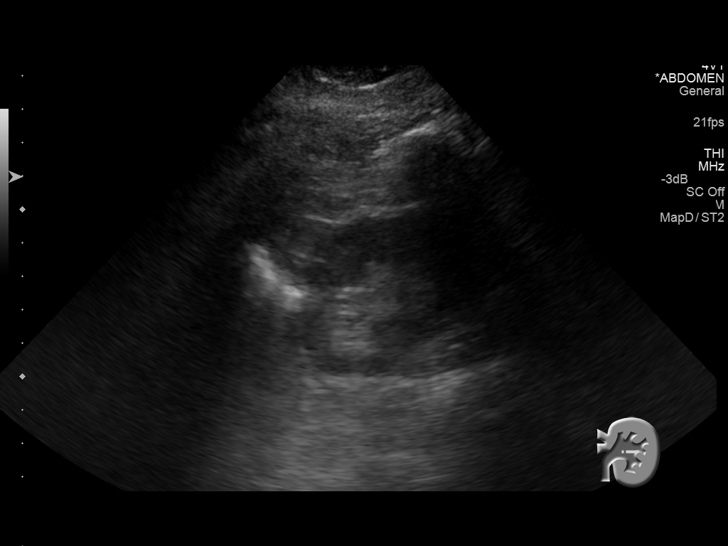
[im 105/105]
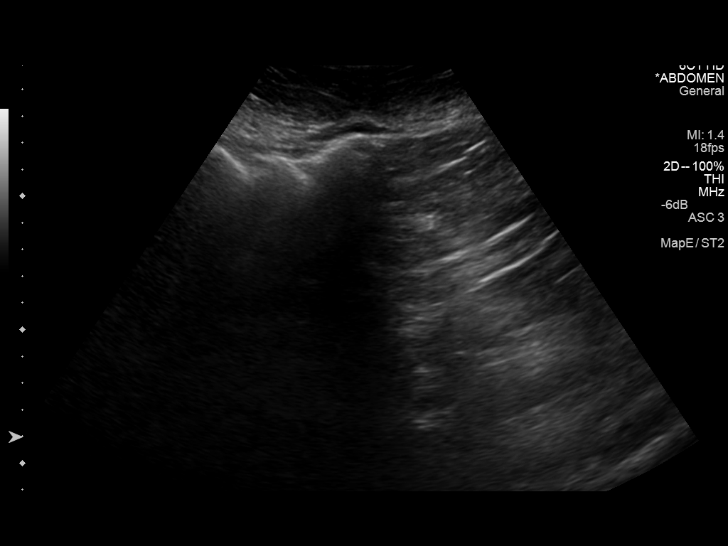

[14 of 25 positions shown; findings below may reference images not displayed]

FINDINGS: Gallbladder: Moderate cholelithiasis with the largest stone
measuring 1.1 cm. No gallbladder wall thickening. Negative
sonographic Murphy sign. No adjacent free fluid.

Common bile duct: Diameter: 3 mm.

Liver: Suggestion a mild diffuse hepatic steatosis.  No focal mass.

IVC: No abnormality visualized.

Pancreas: Not visualized.

Spleen: Size and appearance within normal limits.

Right Kidney: Length: 11.7 cm. Echogenicity within normal limits. 5
cm cyst over the mid pole. No hydronephrosis visualized.

Left Kidney: Length: 14.0 cm. Echogenicity within normal limits.
Findings suggest a duplicated collecting system. Several small cysts
with the largest measuring 2.6 cm over the mid pole. No
hydronephrosis visualized.

Abdominal aorta: No aneurysm visualized.

Other findings: None.
IMPRESSION: Moderate cholelithiasis without additional sonographic evidence to
suggest cholecystitis.

Bilateral renal cysts.

Mild hepatic steatosis.

## 2013-12-26 ENCOUNTER — Encounter: Payer: Self-pay | Admitting: *Deleted

## 2013-12-26 ENCOUNTER — Ambulatory Visit (INDEPENDENT_AMBULATORY_CARE_PROVIDER_SITE_OTHER): Payer: BC Managed Care – PPO | Admitting: *Deleted

## 2013-12-26 VITALS — Wt 209.6 lb

## 2013-12-26 DIAGNOSIS — E669 Obesity, unspecified: Secondary | ICD-10-CM

## 2013-12-26 MED ORDER — PHENTERMINE-TOPIRAMATE ER 7.5-46 MG PO CP24: 1.0000 | ORAL_CAPSULE | Freq: Every day | ORAL | Status: DC

## 2014-01-21 ENCOUNTER — Encounter: Payer: Self-pay | Admitting: Internal Medicine

## 2014-01-29 ENCOUNTER — Ambulatory Visit: Payer: BC Managed Care – PPO

## 2014-02-20 ENCOUNTER — Ambulatory Visit: Payer: BC Managed Care – PPO

## 2014-03-12 ENCOUNTER — Ambulatory Visit: Payer: BC Managed Care – PPO | Admitting: Internal Medicine

## 2014-03-20 ENCOUNTER — Ambulatory Visit: Payer: BC Managed Care – PPO

## 2014-04-15 ENCOUNTER — Other Ambulatory Visit: Payer: Self-pay | Admitting: Gynecology

## 2014-04-15 DIAGNOSIS — N63 Unspecified lump in unspecified breast: Secondary | ICD-10-CM

## 2014-04-29 ENCOUNTER — Encounter: Payer: Self-pay | Admitting: *Deleted

## 2014-05-08 ENCOUNTER — Ambulatory Visit: Payer: Self-pay | Admitting: Internal Medicine

## 2014-05-09 ENCOUNTER — Ambulatory Visit
Admission: RE | Admit: 2014-05-09 | Discharge: 2014-05-09 | Disposition: A | Discharge status: Home / Self Care | Payer: BC Managed Care – PPO | Source: Ambulatory Visit | Attending: Gynecology | Admitting: Gynecology

## 2014-05-09 ENCOUNTER — Other Ambulatory Visit: Payer: Self-pay | Admitting: Gynecology

## 2014-05-09 DIAGNOSIS — N63 Unspecified lump in unspecified breast: Secondary | ICD-10-CM

## 2014-06-18 ENCOUNTER — Ambulatory Visit: Payer: BC Managed Care – PPO | Admitting: Internal Medicine

## 2014-11-18 ENCOUNTER — Encounter: Payer: Self-pay | Admitting: Physician Assistant

## 2014-11-18 ENCOUNTER — Ambulatory Visit (INDEPENDENT_AMBULATORY_CARE_PROVIDER_SITE_OTHER): Payer: BLUE CROSS/BLUE SHIELD | Admitting: Internal Medicine

## 2014-11-18 ENCOUNTER — Encounter: Payer: Self-pay | Admitting: Internal Medicine

## 2014-11-18 ENCOUNTER — Other Ambulatory Visit (INDEPENDENT_AMBULATORY_CARE_PROVIDER_SITE_OTHER): Payer: BLUE CROSS/BLUE SHIELD

## 2014-11-18 VITALS — BP 152/78 | HR 76 | Temp 98.1°F | Wt 214.0 lb

## 2014-11-18 DIAGNOSIS — K2100 Gastro-esophageal reflux disease with esophagitis, without bleeding: Secondary | ICD-10-CM

## 2014-11-18 LAB — H. PYLORI ANTIBODY, IGG: H PYLORI IGG: NEGATIVE

## 2014-11-18 LAB — HEPATIC FUNCTION PANEL
ALT: 45 U/L — AB (ref 0–35)
AST: 20 U/L (ref 0–37)
Albumin: 4.1 g/dL (ref 3.5–5.2)
Alkaline Phosphatase: 120 U/L — ABNORMAL HIGH (ref 39–117)
BILIRUBIN TOTAL: 0.6 mg/dL (ref 0.2–1.2)
Bilirubin, Direct: 0.1 mg/dL (ref 0.0–0.3)
TOTAL PROTEIN: 7.1 g/dL (ref 6.0–8.3)

## 2014-11-18 LAB — CBC WITH DIFFERENTIAL/PLATELET
Basophils Absolute: 0 10*3/uL (ref 0.0–0.1)
Basophils Relative: 0.3 % (ref 0.0–3.0)
EOS PCT: 1.6 % (ref 0.0–5.0)
Eosinophils Absolute: 0.1 10*3/uL (ref 0.0–0.7)
HCT: 40.2 % (ref 36.0–46.0)
Hemoglobin: 13.5 g/dL (ref 12.0–15.0)
LYMPHS ABS: 3.7 10*3/uL (ref 0.7–4.0)
LYMPHS PCT: 50.7 % — AB (ref 12.0–46.0)
MCHC: 33.6 g/dL (ref 30.0–36.0)
MCV: 89.8 fl (ref 78.0–100.0)
Monocytes Absolute: 0.5 10*3/uL (ref 0.1–1.0)
Monocytes Relative: 6.6 % (ref 3.0–12.0)
Neutro Abs: 3 10*3/uL (ref 1.4–7.7)
Neutrophils Relative %: 40.8 % — ABNORMAL LOW (ref 43.0–77.0)
Platelets: 261 10*3/uL (ref 150.0–400.0)
RBC: 4.48 Mil/uL (ref 3.87–5.11)
RDW: 13.7 % (ref 11.5–15.5)
WBC: 7.4 10*3/uL (ref 4.0–10.5)

## 2014-11-18 LAB — AMYLASE: AMYLASE: 62 U/L (ref 27–131)

## 2014-11-18 LAB — LIPASE: Lipase: 37 U/L (ref 11.0–59.0)

## 2014-11-18 NOTE — Progress Notes (Signed)
   Subjective:    Patient ID: Tami Hughes, female    DOB: [birth year], 59 y.o.   MRN: [id number]  HPI  Her symptoms began 3 weeks ago as acute indigestion after a meal. Symptoms have progressed. Zantac was of some benefit. Prilosec has been of less benefit. 2 weeks ago she had severe epigastric pain described as burning with indigestion. She also had some back discomfort since Friday. It was described as mid back with superior radiation. Symptoms are worse supine and described as a level V-VI  on a 10 scale. She's had nausea but no vomiting. Constipation is present but unrelated.  She does believe that spicy meatballs were a trigger. Also broccoli over the weekend caused symptoms. She has a history of GERD but these symptoms are much more severe. She is concerned she may have gallstones. Her colonoscopy within the last decade was normal. She's not had an endoscopy. She takes Motrin as needed. She does not drink or smoke. She does not ingest peppermint. She'll have 1 cup of coffee daily.  Review of Systems Unexplained weight loss, abdominal pain, significant dyspepsia, dysphagia, melena, rectal bleeding, or persistently small caliber stools are denied.      Objective:   Physical Exam   General appearance is one of good health and nourishment w/o distress.  Eyes: No conjunctival inflammation or scleral icterus is present.  Oral exam: Dental hygiene is good; lips and gums are healthy appearing.There is no oropharyngeal erythema or exudate noted.   Heart:  Normal rate and regular rhythm. S1 and S2 normal without gallop, murmur, click, rub or other extra sounds     Lungs:Chest clear to auscultation; no wheezes, rhonchi,rales ,or rubs present.No increased work of breathing.   Abdomen: bowel sounds normal, soft and non-tender without masses, organomegaly or hernias noted.  No guarding or rebound .  Musculoskeletal: Able to lie flat and sit up without help. Negative straight leg raising  bilaterally. Gait normal  Skin:Warm & dry.  Intact without suspicious lesions or rashes ; no jaundice or tenting  Lymphatic: No lymphadenopathy is noted about the head, neck, axilla           Assessment & Plan:  #1 GERD ; R/O H pylori gastritis See AVS & orders

## 2014-11-18 NOTE — Patient Instructions (Addendum)
Reflux of gastric acid may be asymptomatic as this may occur mainly during sleep.The triggers for reflux  include stress; the "aspirin family" ; alcohol; peppermint; and caffeine (coffee, tea, cola, and chocolate). The aspirin family would include aspirin and the nonsteroidal agents such as ibuprofen &  Naproxen. Tylenol would not cause reflux. If having symptoms ; food & drink should be avoided for @ least 2 hours before going to bed.   Take the protein pump inhibitor Prilosec 30 minutes before breakfast and 30 minutes before the evening meal for 8 weeks then go back to once a day  30 minutes before breakfast.  The GI referral will be scheduled and you'll be notified of the time.Please call the Referral Co-Ordinator @ [phone] if you have not been notified of appointment time within 7-10 days.

## 2014-11-18 NOTE — Progress Notes (Signed)
Pre visit review using our clinic review tool, if applicable. No additional management support is needed unless otherwise documented below in the visit note. 

## 2014-12-03 ENCOUNTER — Other Ambulatory Visit (INDEPENDENT_AMBULATORY_CARE_PROVIDER_SITE_OTHER): Payer: BLUE CROSS/BLUE SHIELD

## 2014-12-03 ENCOUNTER — Ambulatory Visit (INDEPENDENT_AMBULATORY_CARE_PROVIDER_SITE_OTHER): Payer: BLUE CROSS/BLUE SHIELD | Admitting: Physician Assistant

## 2014-12-03 ENCOUNTER — Encounter: Payer: Self-pay | Admitting: Physician Assistant

## 2014-12-03 VITALS — BP 110/72 | HR 72 | Ht 63.0 in | Wt 216.2 lb

## 2014-12-03 DIAGNOSIS — R1013 Epigastric pain: Secondary | ICD-10-CM

## 2014-12-03 DIAGNOSIS — K219 Gastro-esophageal reflux disease without esophagitis: Secondary | ICD-10-CM | POA: Diagnosis not present

## 2014-12-03 DIAGNOSIS — Z86010 Personal history of colonic polyps: Secondary | ICD-10-CM | POA: Diagnosis not present

## 2014-12-03 LAB — COMPREHENSIVE METABOLIC PANEL
ALT: 23 U/L (ref 0–35)
AST: 18 U/L (ref 0–37)
Albumin: 4.3 g/dL (ref 3.5–5.2)
Alkaline Phosphatase: 106 U/L (ref 39–117)
BUN: 11 mg/dL (ref 6–23)
CALCIUM: 9.7 mg/dL (ref 8.4–10.5)
CO2: 31 mEq/L (ref 19–32)
Chloride: 104 mEq/L (ref 96–112)
Creatinine, Ser: 1.04 mg/dL (ref 0.40–1.20)
GFR: 69.55 mL/min (ref >60.00)
GLUCOSE: 102 mg/dL — AB (ref 70–99)
POTASSIUM: 4 meq/L (ref 3.5–5.1)
SODIUM: 140 meq/L (ref 135–145)
TOTAL PROTEIN: 7.2 g/dL (ref 6.0–8.3)
Total Bilirubin: 0.6 mg/dL (ref 0.2–1.2)

## 2014-12-03 LAB — LIPASE: LIPASE: 37 U/L (ref 11.0–59.0)

## 2014-12-03 LAB — AMYLASE: Amylase: 72 U/L (ref 27–131)

## 2014-12-03 LAB — GAMMA GT: GGT: 100 U/L — AB (ref 7–51)

## 2014-12-03 MED ORDER — PANTOPRAZOLE SODIUM 40 MG PO TBEC: 40.0000 mg | DELAYED_RELEASE_TABLET | Freq: Every day | ORAL | Status: DC

## 2014-12-03 NOTE — Progress Notes (Signed)
Patient ID: Tami Hughes, female   DOB: 1956-03-11, 59 y.o.   MRN: [id number]     History of Present Illness: PAISLIE TESSLER known to Dr. Carlean Purl from previous colonoscopies for colon polyps. Her last colonoscopy was on 01/27/2012. It was a normal colonoscopy except for internal hemorrhoids. Due to a history of an adenoma removal in 2009 she was advised to have a surveillance colonoscopy in 2018. Her past medical history significant for hypertension venous insufficiency GERD hyperlipidemia colon polyps and herpes simplex.  She has a long-standing history of GERD having had an upper endoscopy by Dr. Thana Farr in 1993. Since that time she has not been on any maintenance medications but has periodically used over-the-counter remedies like ranitidine and Pepto-Bismol. Over the past 6 weeks she has been having frequent heartburn with a nocturnal cough and nocturnal regurgitation. She has frequent phlegm in the morning. She feels worse in the morning. She has no dysphasia. Her eating some spicy meatballs she developed severe epigastric pain associated with bloating and diarrhea. She used ranitidine with some degree of relief but the following day was nauseous and had a dull pain in the epigastric area that radiated through to the back. He uses Pepto-Bismol for several days. Several days after that she had another postprandial episode of severe epigastric pain associated with nausea, belching, and flatulence. She burped profusely with some relief of her discomfort and again her discomfort was partially relieved with ranitidine. She was evaluated by her primary care provider and sent for LFTs which she noted to be elevated. She is quite concerned about her elevated alkaline phosphatase as she says she rarely drinks alcohol, perhaps 1 glass of wine per month. She does not use a lot of Tylenol. She is concerned about possible gallbladder etiology for her discomfort.   Past Medical History  Diagnosis Date  .  Hypertension   . Polyp of colon 2009    adenoma benign  . Dyslipidemia   . Shingles     recurrent: 08/2010, 10/2013    Past Surgical History  Procedure Laterality Date  . Abdominal hysterectomy    . Labioplasty    . Vulvectomy      partial vulvectomy secondary to labial hypertrophy..  . Pubovaginal sling  05/08/2010    stress urinary incontinence  . Colonoscopy     Family History  Problem Relation Age of Onset  . Kidney disease Mother     had transplant  . Breast cancer Mother   . Colon cancer Neg Hx   . Esophageal cancer Neg Hx   . Rectal cancer Neg Hx   . Stomach cancer Neg Hx   . Diabetes Neg Hx   . Hyperlipidemia Neg Hx   . Heart disease Neg Hx   . COPD Neg Hx    History  Substance Use Topics  . Smoking status: Never Smoker   . Smokeless tobacco: Never Used  . Alcohol Use: 1.0 oz/week    2 drink(s) per week     Comment: occasionally - wine   Current Outpatient Prescriptions  Medication Sig Dispense Refill  . calcium-vitamin D (OSCAL WITH D) 500-200 MG-UNIT per tablet Take 1 tablet by mouth daily.      Marland Kitchen diltiazem (DILACOR XR) 240 MG 24 hr capsule Take 1 capsule (240 mg total) by mouth daily. 90 capsule 3  . gabapentin (NEURONTIN) 100 MG capsule One pill every eight hours as needed; dose may be increased by one pill each dose after 72 hours if only  partially effective 30 capsule 1  . ibuprofen (ADVIL,MOTRIN) 200 MG tablet Take 800 mg by mouth every 6 (six) hours as needed.    Marland Kitchen lisinopril-hydrochlorothiazide (PRINZIDE,ZESTORETIC) 20-12.5 MG per tablet TAKE ONE TABLET BY MOUTH ONCE DAILY 90 tablet 3  . Methylcellulose, Laxative, (CITRUCEL) 500 MG TABS Take 500 mg by mouth as needed.     . Multiple Vitamin (MULTIVITAMIN) capsule Take 1 capsule by mouth as needed.     . Phentermine-Topiramate (QSYMIA) 7.5-46 MG CP24 Take 1 capsule by mouth daily. 30 capsule 0  . ranitidine (ZANTAC) 150 MG capsule Take 1 capsule (150 mg total) by mouth as needed. 90 capsule 0  .  valACYclovir (VALTREX) 1000 MG tablet Take 1 tablet (1,000 mg total) by mouth 3 (three) times daily. 21 tablet 0  . vitamin E (VITAMIN E) 400 UNIT capsule Take 400 Units by mouth daily.       No current facility-administered medications for this visit.   No Known Allergies    Review of Systems: Gen: Denies any fever, chills, sweats, anorexia, fatigue, weakness, malaise, weight loss, and sleep disorder CV: Denies chest pain, angina, palpitations, syncope, orthopnea, PND, peripheral edema, and claudication. Resp: Denies dyspnea at rest, dyspnea with exercise, cough, sputum, wheezing, coughing up blood, and pleurisy. GI: Denies vomiting blood, jaundice, and fecal incontinence.   Denies dysphagia or odynophagia. GU : Denies urinary burning, blood in urine, urinary frequency, urinary hesitancy, nocturnal urination, and urinary incontinence. MS: Denies joint pain, limitation of movement, and swelling, stiffness, low back pain, extremity pain. Denies muscle weakness, cramps, atrophy.  Derm: Denies rash, itching, dry skin, hives, moles, warts, or unhealing ulcers.  Psych: Denies depression, anxiety, memory loss, suicidal ideation, hallucinations, paranoia, and confusion. Heme: Denies bruising, bleeding, and enlarged lymph nodes. Neuro:  Denies any headaches, dizziness, paresthesia Endo:  Denies any problems with DM, thyroid, adrenal  LAB RESULTS: Blood work on 11/18/2014 H pylori negative, alkaline phosphatase 120, AST 20, ALT 45, total bili 0.6, direct bili 0.1.  Lipase 37, amylase 62. CBC white blood count 7.4, hemoglobin 13.5, hematocrit 40.2, platelets 261,000, MCV 89.8.     Physical Exam: General: Pleasant, well developed African-American female in no acute distress Head: Normocephalic and atraumatic Eyes:  sclerae anicteric, conjunctiva pink  Ears: Normal auditory acuity Lungs: Clear throughout to auscultation Heart: Regular rate and rhythm Abdomen: Soft, non distended, mild  epigastric tenderness with no rebound or guarding No masses, no hepatomegaly. Normal bowel sounds Rectal: Deferred Musculoskeletal: Symmetrical with no gross deformities  Extremities: No edema  Neurological: Alert oriented x 4, grossly nonfocal Psychological:  Alert and cooperative. Normal mood and affect  Assessment and Recommendations: #1. GERD, and antireflux regimen has been reviewed. She will be given a trial of pantoprazole 40 mg by mouth every morning 30 minutes before breakfast. As she has been experiencing postprandial epigastric discomfort with some radiation to the back associated with nausea, eructation, and flatulence, and ultrasound will be obtained to evaluate for cholelithiasis, ductal dilatation, fatty liver, etc. A comprehensive metabolic panel, GGT, amylase and lipase will be obtained as well. Due to the fact that her scan of reflux symptoms has dated back to the early 1990s resulting in an EGD by Dr. Thana Farr in 1993, an EGD will be ordered to evaluate for possible esophagitis, Barrett's, gastritis, ulcers, etc.The risks, benefits, and alternatives to endoscopy with possible biopsy and possible dilation were discussed with the patient and they consent to proceed.   #2. Personal history of adenomatous colon polyps. Patient  had several adenomas removed in 2009. Surveillance colonoscopy 01/27/2012 was a normal colonoscopy aside from internal hemorrhoids. Patient is on the colonoscopy recall for surveillance colonoscopy in August 2018.        Tecla Mailloux, Vita Barley PA-C 12/03/2014,

## 2014-12-03 NOTE — Addendum Note (Signed)
Addended by: Oda Kilts on: 12/03/2014 09:21 AM   Modules accepted: Orders

## 2014-12-03 NOTE — Patient Instructions (Signed)
You have been scheduled for an abdominal ultrasound at [facility] (1st floor of hospital) on 12/10/2014 at 7:30am. Please arrive 15 minutes prior to your appointment for registration. Make certain not to have anything to eat or drink 6 hours prior to your appointment. Should you need to reschedule your appointment, please contact radiology at [phone]. This test typically takes about 30 minutes to perform.  You have been scheduled for an endoscopy. Please follow written instructions given to you at your visit today. If you use inhalers (even only as needed), please bring them with you on the day of your procedure. Your physician has requested that you go to www.startemmi.com and enter the access code given to you at your visit today. This web site gives a general overview about your procedure. However, you should still follow specific instructions given to you by our office regarding your preparation for the procedure.  Go to the basement for labs today  Food Choices for Gastroesophageal Reflux Disease When you have gastroesophageal reflux disease (GERD), the foods you eat and your eating habits are very important. Choosing the right foods can help ease the discomfort of GERD. WHAT GENERAL GUIDELINES DO I NEED TO FOLLOW?  Choose fruits, vegetables, whole grains, low-fat dairy products, and low-fat meat, fish, and poultry.  Limit fats such as oils, salad dressings, butter, nuts, and avocado.  Keep a food diary to identify foods that cause symptoms.  Avoid foods that cause reflux. These may be different for different people.  Eat frequent small meals instead of three large meals each day.  Eat your meals slowly, in a relaxed setting.  Limit fried foods.  Cook foods using methods other than frying.  Avoid drinking alcohol.  Avoid drinking large amounts of liquids with your meals.  Avoid bending over or lying down until 2-3 hours after eating. WHAT FOODS ARE NOT  RECOMMENDED? The following are some foods and drinks that may worsen your symptoms: Vegetables Tomatoes. Tomato juice. Tomato and spaghetti sauce. Chili peppers. Onion and garlic. Horseradish. Fruits Oranges, grapefruit, and lemon (fruit and juice). Meats High-fat meats, fish, and poultry. This includes hot dogs, ribs, ham, sausage, salami, and bacon. Dairy Whole milk and chocolate milk. Sour cream. Cream. Butter. Ice cream. Cream cheese.  Beverages Coffee and tea, with or without caffeine. Carbonated beverages or energy drinks. Condiments Hot sauce. Barbecue sauce.  Sweets/Desserts Chocolate and cocoa. Donuts. Peppermint and spearmint. Fats and Oils High-fat foods, including [place] fries and potato chips. Other Vinegar. Strong spices, such as black pepper, white pepper, red pepper, cayenne, curry powder, cloves, ginger, and chili powder. The items listed above may not be a complete list of foods and beverages to avoid. Contact your dietitian for more information. Document Released: 06/14/2005 Document Revised: 06/19/2013 Document Reviewed: 04/18/2013 [facility] Patient Information 2015 [place], [place]. This information is not intended to replace advice given to you by your health care provider. Make sure you discuss any questions you have with your health care provider.

## 2014-12-06 ENCOUNTER — Other Ambulatory Visit: Payer: Self-pay | Admitting: Internal Medicine

## 2014-12-10 ENCOUNTER — Ambulatory Visit (HOSPITAL_COMMUNITY)
Admission: RE | Admit: 2014-12-10 | Discharge: 2014-12-10 | Disposition: A | Discharge status: Home / Self Care | Payer: BLUE CROSS/BLUE SHIELD | Source: Ambulatory Visit | Attending: Physician Assistant | Admitting: Physician Assistant

## 2014-12-10 ENCOUNTER — Telehealth: Payer: Self-pay | Admitting: Physician Assistant

## 2014-12-10 ENCOUNTER — Telehealth: Payer: Self-pay

## 2014-12-10 DIAGNOSIS — K219 Gastro-esophageal reflux disease without esophagitis: Secondary | ICD-10-CM

## 2014-12-10 DIAGNOSIS — N281 Cyst of kidney, acquired: Secondary | ICD-10-CM | POA: Insufficient documentation

## 2014-12-10 DIAGNOSIS — R1013 Epigastric pain: Secondary | ICD-10-CM | POA: Diagnosis present

## 2014-12-10 DIAGNOSIS — K8020 Calculus of gallbladder without cholecystitis without obstruction: Secondary | ICD-10-CM | POA: Diagnosis not present

## 2014-12-10 DIAGNOSIS — K760 Fatty (change of) liver, not elsewhere classified: Secondary | ICD-10-CM | POA: Insufficient documentation

## 2014-12-10 NOTE — Telephone Encounter (Signed)
I spoke with the patient and she is feeling better.  EGD is cancelled.  She agrees with surgical referral.  She is aware that I will call her back when I have this scheduled.

## 2014-12-10 NOTE — Progress Notes (Signed)
Agree w/ Ms. Hvozdovic's note and mangement.  

## 2014-12-10 NOTE — Telephone Encounter (Signed)
-----   Message from Vita Barley Hvozdovic, PA-C sent at 12/10/2014 12:14 PM EDT ----- Regarding: FW: EGD? Ralphie Lovelady, if she is feeling better on ppi we can cancel ppi. ----- Message -----    From: Gatha Mayer, MD    Sent: 12/10/2014  12:10 PM      To: Vita Barley Hvozdovic, PA-C Subject: EGD?                                           If she is getting better re GERD etc on PPI she could cancel the EGD I think  Doesn't have to but would offer that as isolated epigastric pain after eating more likely gallstones

## 2014-12-10 NOTE — Telephone Encounter (Signed)
I clarified with Cecille Rubin Hvozdovic, PA that note should say cancel EGD not PPI She is scheduled for appt with Dr. Redmond Pulling for 12/26/14 3:30 Patient aware of the appt date and time

## 2014-12-10 NOTE — Progress Notes (Signed)
Quick Note:  I do recommend surgical consultation for epigastric pain and gallstones She should see me in August re fatty liver and GERD Stay on PPI ______

## 2014-12-11 ENCOUNTER — Telehealth: Payer: Self-pay | Admitting: Physician Assistant

## 2014-12-11 NOTE — Telephone Encounter (Signed)
See results note. 

## 2014-12-11 NOTE — Telephone Encounter (Signed)
Spoke with patient and told [facility] can see our records.

## 2014-12-12 NOTE — Progress Notes (Signed)
Agree w/ Ms. Hvozdovic's note and mangement.  

## 2014-12-13 ENCOUNTER — Ambulatory Visit: Payer: Self-pay | Admitting: Surgery

## 2014-12-13 NOTE — H&P (Signed)
Tami Hughes 12/13/2014 1:35 PM Location: [facility] Patient #: [id number] DOB: [birth year] Widowed / Language: Cleophus Molt / Race: Black or African American Female  History of Present Illness Rodman Key B. Hassell Done MD; 12/13/2014 2:23 PM) Patient words: gallbladder :  Gallbladder: Moderate cholelithiasis with the largest stone measuring 1.1 cm. No gallbladder wall thickening. Negative sonographic Murphy sign. No adjacent free fluid.  Common bile duct: Diameter: 3 mm.  Has been having classic symptoms of midepigastric pain radiating into the back. Pain, nausea, etc. Most of that has resolved. Had elevated LFTs and referred to Silvano Rusk who obtained ultrasound-I gave her a copy of the scan. She may have smouldering subacute cholecystitis but her ultrasound didn't show it.  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   Lap chole explained to her in detail. I went over CBD injury and other issues. Possibility of ope chole. Also discussed weight loss, diet and Qsymia.  The patient is a 59 year old female    Other Problems Elbert Ewings, [place]; 12/13/2014 1:36 PM) Back Pain Gastroesophageal Reflux Disease High blood pressure Hypercholesterolemia  Past Surgical History Elbert Ewings, CMA; 12/13/2014 1:36 PM) Colon Polyp Removal - Open Hysterectomy (not due to cancer) - Partial Tonsillectomy  Diagnostic Studies History Elbert Ewings, CMA; 12/13/2014 1:36 PM) Colonoscopy 1-5 years ago Mammogram within last year Pap Smear 1-5 years ago  Allergies Elbert Ewings, CMA; 12/13/2014 1:36 PM) No Known Drug Allergies06/17/2016  Medication History Elbert Ewings, CMA; 12/13/2014 1:38 PM) Geronimo Boot XT (240MG  Capsule ER 24HR, Oral) Active. Lisinopril-Hydrochlorothiazide (20-12.5MG  Tablet, Oral) Active. Pantoprazole Sodium (40MG  Tablet DR, Oral) Active. Gabapentin (100MG  Capsule, Oral) Active. Citrucel (500MG  Tablet,  Oral) Active. Qsymia (7.5-46MG  Capsule ER 24HR, Oral) Active. Zantac (150MG  Tablet, Oral as needed) Active. Valtrex (500MG  Tablet, Oral daily) Active. Vitamin E (400UNIT Tablet, Oral) Active. Medications Reconciled  Social History Elbert Ewings, [place]; 12/13/2014 1:36 PM) Alcohol use Occasional alcohol use. Caffeine use Carbonated beverages, Coffee. No drug use Tobacco use Never smoker.  Family History Elbert Ewings, [place]; 12/13/2014 1:36 PM) Arthritis Mother. Breast Cancer Mother. Colon Polyps Mother. Hypertension Mother.  Pregnancy / Birth History Elbert Ewings, CMA; 12/13/2014 1:36 PM) Age at menarche 59 years. Gravida 2 Maternal age 7-25 Para 2  Review of Systems Elbert Ewings CMA; 12/13/2014 1:36 PM) General Not Present- Appetite Loss, Chills, Fatigue, Fever, Night Sweats, Weight Gain and Weight Loss. Skin Not Present- Change in Wart/Mole, Dryness, Hives, Jaundice, New Lesions, Non-Healing Wounds, Rash and Ulcer. HEENT Present- Seasonal Allergies, Sinus Pain and Wears glasses/contact lenses. Not Present- Earache, Hearing Loss, Hoarseness, Nose Bleed, Oral Ulcers, Ringing in the Ears, Sore Throat, Visual Disturbances and Yellow Eyes. Respiratory Not Present- Bloody sputum, Chronic Cough, Difficulty Breathing, Snoring and Wheezing. Cardiovascular Not Present- Chest Pain, Difficulty Breathing Lying Down, Leg Cramps, Palpitations, Rapid Heart Rate, Shortness of Breath and Swelling of Extremities. Gastrointestinal Present- Abdominal Pain, Bloating, Constipation, Excessive gas and Indigestion. Not Present- Bloody Stool, Change in Bowel Habits, Chronic diarrhea, Difficulty Swallowing, Gets full quickly at meals, Hemorrhoids, Nausea, Rectal Pain and Vomiting. Musculoskeletal Present- Back Pain. Not Present- Joint Pain, Joint Stiffness, Muscle Pain, Muscle Weakness and Swelling of Extremities. Neurological Not Present- Decreased Memory, Fainting, Headaches, Numbness, Seizures,  Tingling, Tremor, Trouble walking and Weakness. Psychiatric Not Present- Anxiety, Bipolar, Change in Sleep Pattern, Depression, Fearful and Frequent crying. Endocrine Not Present- Cold Intolerance, Excessive Hunger, Hair Changes, Heat Intolerance, Hot flashes and New Diabetes. Hematology Not Present-  Easy Bruising, Excessive bleeding, Gland problems, HIV and Persistent Infections.   Vitals Elbert Ewings CMA; 12/13/2014 1:39 PM) 12/13/2014 1:38 PM Weight: 214 lb Height: 63in Body Surface Area: 2.08 m Body Mass Index: 37.91 kg/m Temp.: 98.58F(Oral)  Pulse: 77 (Regular)  BP: 128/78 (Sitting, Left Arm, Standard)    Physical Exam (Atheena Spano B. Hassell Done MD; 12/13/2014 2:19 PM) General Note: HEENT unremarkable Neck supple without adenopathy or bruits Chest clear Heart SR without murmurs Abdomen is nontender GU not examined prior vag hysterctomy Ext FROM no DVT Neuro alert and oriented x 3     Assessment & Plan Rodman Key B. Hassell Done MD; 12/13/2014 2:20 PM) CHOLECYSTITIS (575.10  K81.9) Impression: Will get her scheduled asap for lap chole IOC  Matt B. Hassell Done, MD, [facility] [phone] beeper [phone]  12/13/2014 2:32 PM

## 2014-12-16 ENCOUNTER — Encounter (HOSPITAL_COMMUNITY): Payer: Self-pay | Admitting: *Deleted

## 2014-12-17 ENCOUNTER — Encounter: Payer: Self-pay | Admitting: Internal Medicine

## 2014-12-17 ENCOUNTER — Other Ambulatory Visit: Payer: Self-pay

## 2014-12-18 ENCOUNTER — Ambulatory Visit (HOSPITAL_COMMUNITY): Payer: BLUE CROSS/BLUE SHIELD | Admitting: Certified Registered Nurse Anesthetist

## 2014-12-18 ENCOUNTER — Ambulatory Visit (HOSPITAL_COMMUNITY): Payer: BLUE CROSS/BLUE SHIELD

## 2014-12-18 ENCOUNTER — Encounter (HOSPITAL_COMMUNITY): Payer: Self-pay | Admitting: *Deleted

## 2014-12-18 ENCOUNTER — Encounter (HOSPITAL_COMMUNITY)
Admission: RE | Disposition: A | Discharge status: Home / Self Care | Payer: Self-pay | Source: Ambulatory Visit | Attending: Surgery

## 2014-12-18 ENCOUNTER — Observation Stay (HOSPITAL_COMMUNITY)
Admission: RE | Admit: 2014-12-18 | Discharge: 2014-12-19 | Disposition: A | Discharge status: Home / Self Care | Payer: BLUE CROSS/BLUE SHIELD | Source: Ambulatory Visit | Attending: Surgery | Admitting: Surgery

## 2014-12-18 DIAGNOSIS — K219 Gastro-esophageal reflux disease without esophagitis: Secondary | ICD-10-CM | POA: Insufficient documentation

## 2014-12-18 DIAGNOSIS — I10 Essential (primary) hypertension: Secondary | ICD-10-CM | POA: Insufficient documentation

## 2014-12-18 DIAGNOSIS — I739 Peripheral vascular disease, unspecified: Secondary | ICD-10-CM | POA: Insufficient documentation

## 2014-12-18 DIAGNOSIS — Z419 Encounter for procedure for purposes other than remedying health state, unspecified: Secondary | ICD-10-CM

## 2014-12-18 DIAGNOSIS — E780 Pure hypercholesterolemia: Secondary | ICD-10-CM | POA: Diagnosis not present

## 2014-12-18 DIAGNOSIS — K8010 Calculus of gallbladder with chronic cholecystitis without obstruction: Principal | ICD-10-CM | POA: Insufficient documentation

## 2014-12-18 DIAGNOSIS — Z9049 Acquired absence of other specified parts of digestive tract: Secondary | ICD-10-CM

## 2014-12-18 HISTORY — PX: CHOLECYSTECTOMY: SHX55

## 2014-12-18 LAB — CBC
HCT: 35.4 % — ABNORMAL LOW (ref 36.0–46.0)
Hemoglobin: 11.9 g/dL — ABNORMAL LOW (ref 12.0–15.0)
MCH: 30.1 pg (ref 26.0–34.0)
MCHC: 33.6 g/dL (ref 30.0–36.0)
MCV: 89.6 fL (ref 78.0–100.0)
Platelets: 206 10*3/uL (ref 150–400)
RBC: 3.95 MIL/uL (ref 3.87–5.11)
RDW: 13.2 % (ref 11.5–15.5)
WBC: 9 10*3/uL (ref 4.0–10.5)

## 2014-12-18 LAB — CREATININE, SERUM
CREATININE: 0.96 mg/dL (ref 0.44–1.00)
GFR calc non Af Amer: >60 mL/min (ref >60)

## 2014-12-18 IMAGING — RF DG CHOLANGIOGRAM OPERATIVE
1 series · 4 of 4 positions shown · non-contrast
Comparison: [DATE]

CLINICAL DATA: Cholelithiasis

EXAM:
INTRAOPERATIVE CHOLANGIOGRAM
TECHNIQUE: Cholangiographic images from the C-arm fluoroscopic device were
submitted for interpretation post-operatively. Please see the
procedural report for the amount of contrast and the fluoroscopy
time utilized.

[Series 1: run · 4 of 95 frames shown]
[frame 15/95]
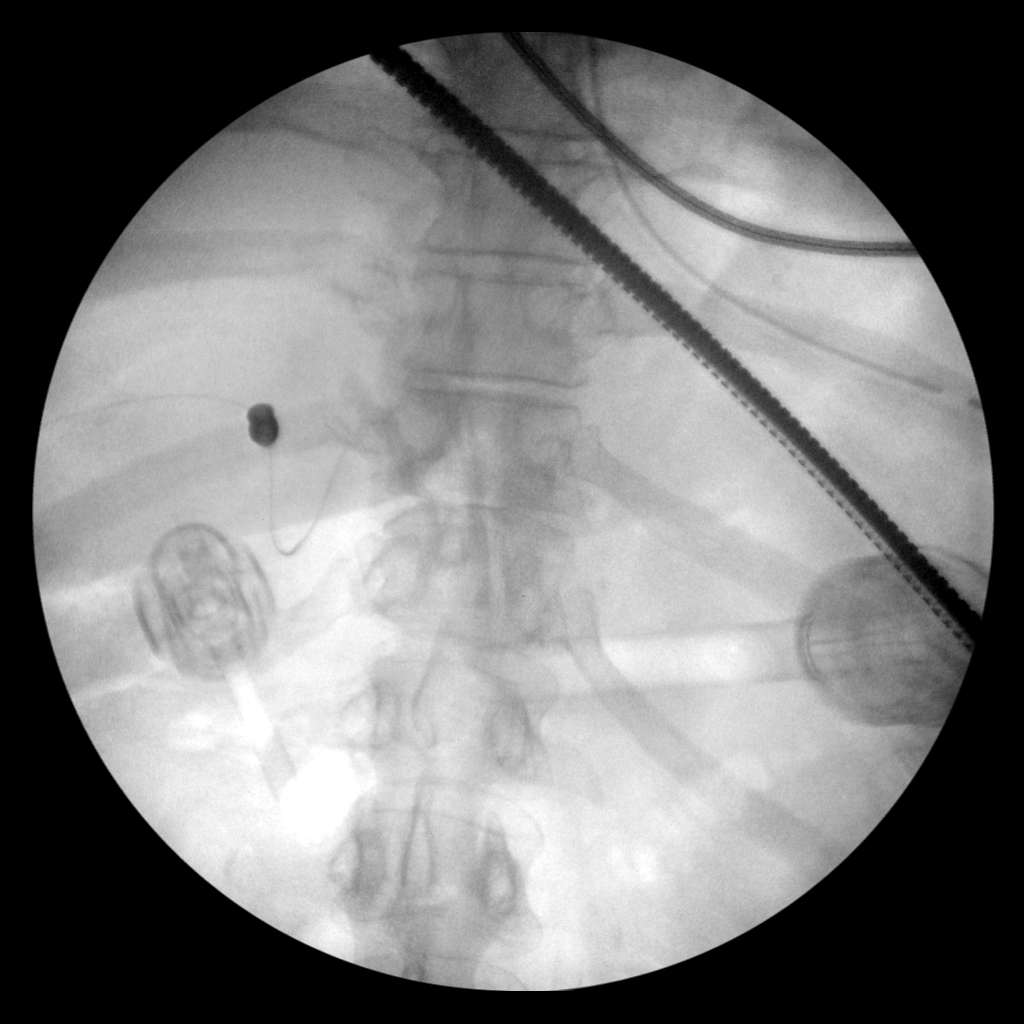
[frame 48/95]
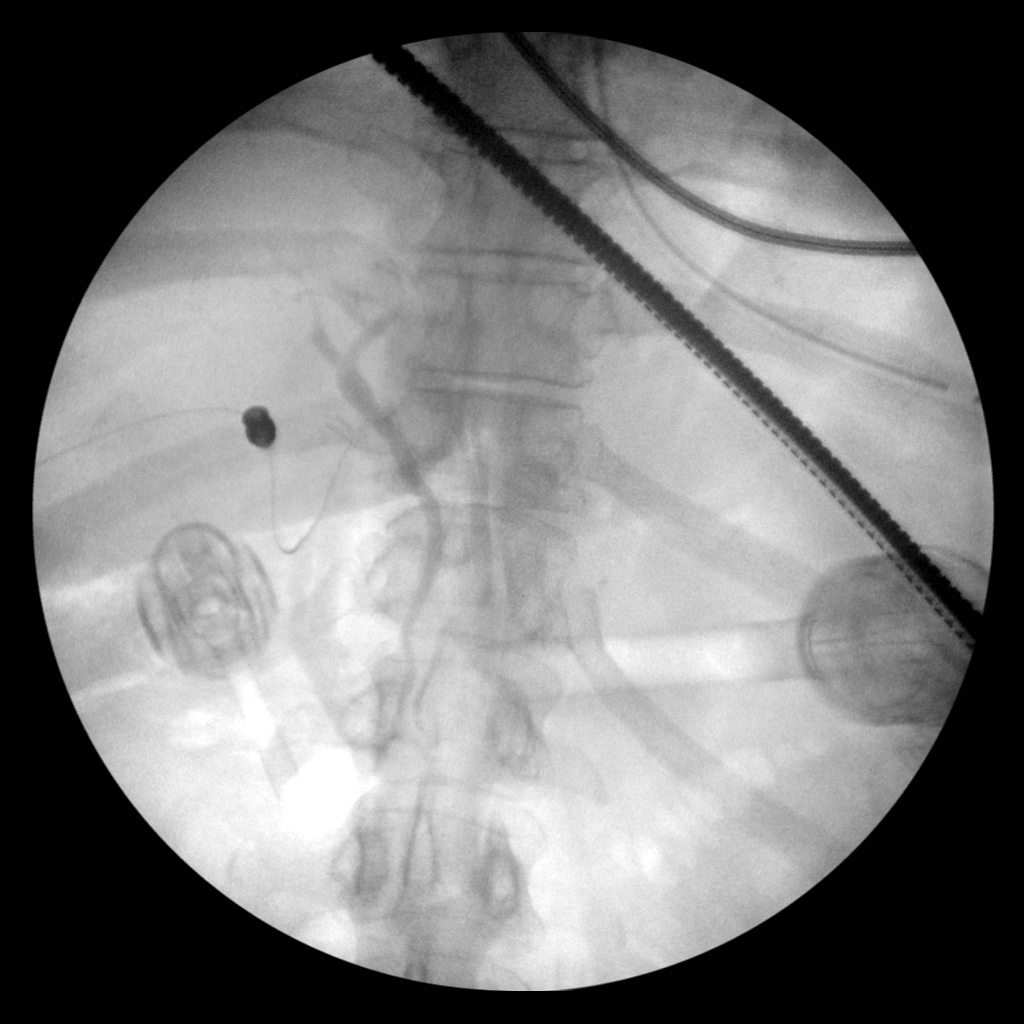
[frame 73/95]
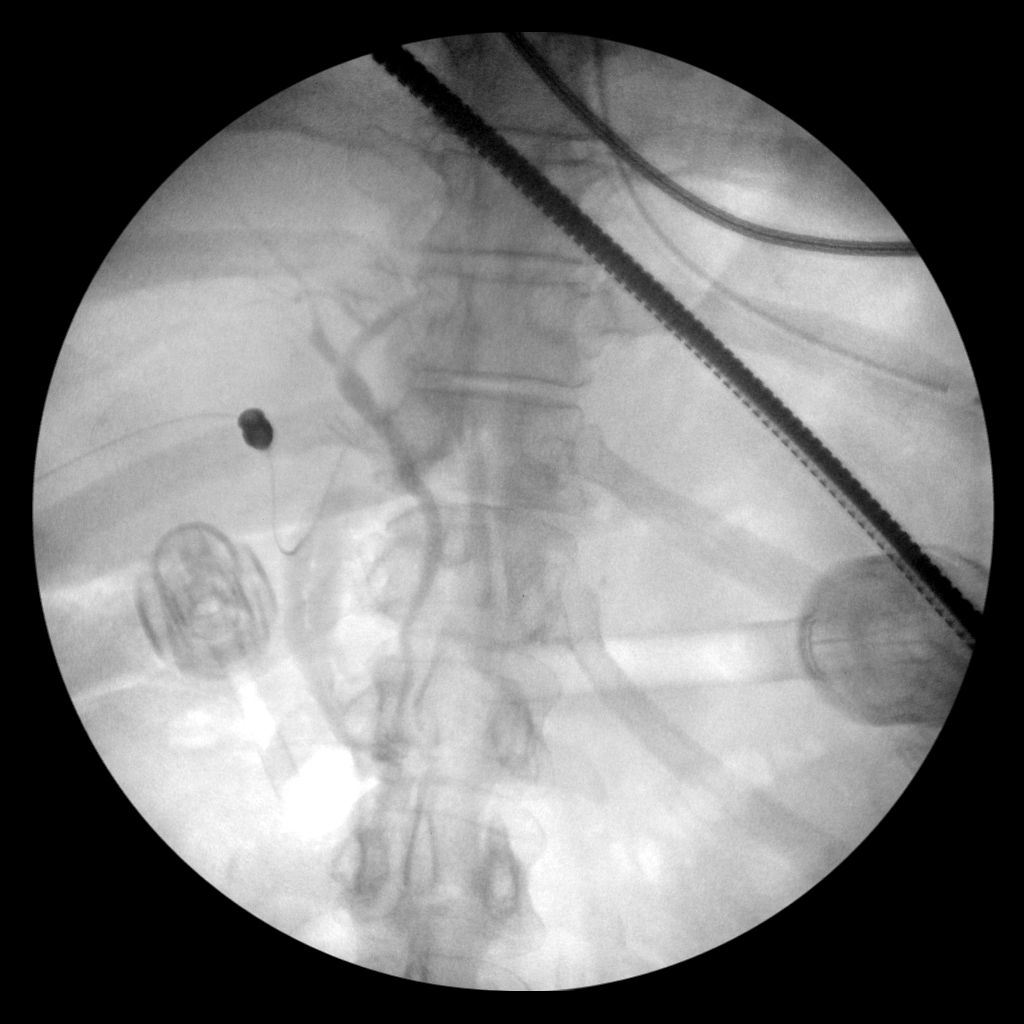
[frame 81/95]
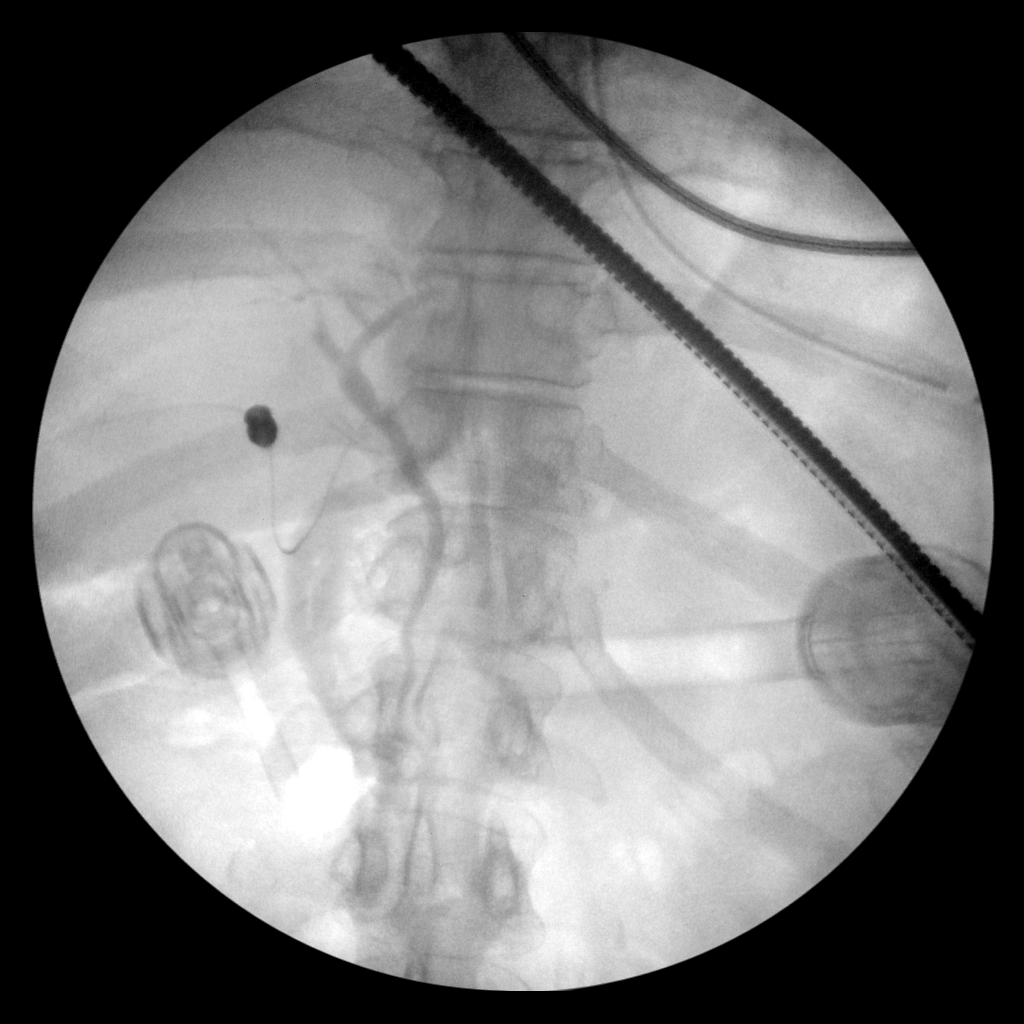

[4 of 4 positions shown; findings below may reference images not displayed]

FINDINGS: Intraoperative cholangiogram performed during the laparoscopic
cholecystectomy. The biliary confluence, common hepatic duct,
residual cystic duct, and common bile duct appear patent. Contrast
drains into the duodenum. No dilatation, obstruction or filling
defect.
IMPRESSION: Patent biliary system.

## 2014-12-18 SURGERY — LAPAROSCOPIC CHOLECYSTECTOMY WITH INTRAOPERATIVE CHOLANGIOGRAM
Anesthesia: General | Site: Abdomen

## 2014-12-18 MED ORDER — HYDROMORPHONE HCL 1 MG/ML IJ SOLN
0.2500 mg | INTRAMUSCULAR | Status: DC | PRN
  Administered 2014-12-18 (×2): 0.25 mg via INTRAVENOUS
  Administered 2014-12-18 (×3): 0.5 mg via INTRAVENOUS

## 2014-12-18 MED ORDER — ONDANSETRON HCL 4 MG/2ML IJ SOLN
4.0000 mg | Freq: Four times a day (QID) | INTRAMUSCULAR | Status: DC | PRN
  Administered 2014-12-18: 4 mg via INTRAVENOUS
  Filled 2014-12-18: qty 2

## 2014-12-18 MED ORDER — PROPOFOL 10 MG/ML IV BOLUS
INTRAVENOUS | Status: AC
  Filled 2014-12-18: qty 20

## 2014-12-18 MED ORDER — DEXAMETHASONE SODIUM PHOSPHATE 10 MG/ML IJ SOLN
INTRAMUSCULAR | Status: AC
  Filled 2014-12-18: qty 1

## 2014-12-18 MED ORDER — HEPARIN SODIUM (PORCINE) 5000 UNIT/ML IJ SOLN
5000.0000 [IU] | Freq: Three times a day (TID) | INTRAMUSCULAR | Status: DC
  Administered 2014-12-18 – 2014-12-19 (×2): 5000 [IU] via SUBCUTANEOUS
  Filled 2014-12-18 (×5): qty 1

## 2014-12-18 MED ORDER — CHLORHEXIDINE GLUCONATE 4 % EX LIQD: 1.0000 "application " | Freq: Once | CUTANEOUS | Status: DC

## 2014-12-18 MED ORDER — BUPIVACAINE LIPOSOME 1.3 % IJ SUSP
20.0000 mL | Freq: Once | INTRAMUSCULAR | Status: AC
  Administered 2014-12-18: 20 mL
  Filled 2014-12-18: qty 20

## 2014-12-18 MED ORDER — MIDAZOLAM HCL 2 MG/2ML IJ SOLN
INTRAMUSCULAR | Status: AC
  Filled 2014-12-18: qty 2

## 2014-12-18 MED ORDER — ROCURONIUM BROMIDE 100 MG/10ML IV SOLN
INTRAVENOUS | Status: DC | PRN
  Administered 2014-12-18: 40 mg via INTRAVENOUS
  Administered 2014-12-18: 5 mg via INTRAVENOUS

## 2014-12-18 MED ORDER — GLYCOPYRROLATE 0.2 MG/ML IJ SOLN
INTRAMUSCULAR | Status: AC
  Filled 2014-12-18: qty 3

## 2014-12-18 MED ORDER — GLYCOPYRROLATE 0.2 MG/ML IJ SOLN
INTRAMUSCULAR | Status: DC | PRN
  Administered 2014-12-18: .8 mg via INTRAVENOUS

## 2014-12-18 MED ORDER — SUCCINYLCHOLINE CHLORIDE 20 MG/ML IJ SOLN
INTRAMUSCULAR | Status: DC | PRN
  Administered 2014-12-18: 100 mg via INTRAVENOUS

## 2014-12-18 MED ORDER — IOHEXOL 300 MG/ML  SOLN
INTRAMUSCULAR | Status: DC | PRN
  Administered 2014-12-18: 6 mL

## 2014-12-18 MED ORDER — LACTATED RINGERS IV SOLN
INTRAVENOUS | Status: DC
  Administered 2014-12-18: 1000 mL via INTRAVENOUS

## 2014-12-18 MED ORDER — HYDROMORPHONE HCL 1 MG/ML IJ SOLN
INTRAMUSCULAR | Status: AC
  Filled 2014-12-18: qty 1

## 2014-12-18 MED ORDER — HYDROCODONE-ACETAMINOPHEN 5-325 MG PO TABS: 1.0000 | ORAL_TABLET | ORAL | Status: DC | PRN

## 2014-12-18 MED ORDER — ACETAMINOPHEN 325 MG PO TABS: 650.0000 mg | ORAL_TABLET | ORAL | Status: DC | PRN

## 2014-12-18 MED ORDER — DEXAMETHASONE SODIUM PHOSPHATE 10 MG/ML IJ SOLN
INTRAMUSCULAR | Status: DC | PRN
  Administered 2014-12-18: 10 mg via INTRAVENOUS

## 2014-12-18 MED ORDER — EPHEDRINE SULFATE 50 MG/ML IJ SOLN
INTRAMUSCULAR | Status: AC
  Filled 2014-12-18: qty 1

## 2014-12-18 MED ORDER — HYDROMORPHONE HCL 1 MG/ML IJ SOLN
INTRAMUSCULAR | Status: AC
Start: 2014-12-18 — End: 2014-12-19
  Filled 2014-12-18: qty 1

## 2014-12-18 MED ORDER — LIDOCAINE HCL (CARDIAC) 20 MG/ML IV SOLN
INTRAVENOUS | Status: AC
  Filled 2014-12-18: qty 5

## 2014-12-18 MED ORDER — KCL IN DEXTROSE-NACL 20-5-0.45 MEQ/L-%-% IV SOLN
INTRAVENOUS | Status: DC
  Administered 2014-12-18: 1000 mL via INTRAVENOUS
  Filled 2014-12-18 (×3): qty 1000

## 2014-12-18 MED ORDER — NEOSTIGMINE METHYLSULFATE 10 MG/10ML IV SOLN
INTRAVENOUS | Status: DC | PRN
  Administered 2014-12-18: 5 mg via INTRAVENOUS

## 2014-12-18 MED ORDER — EPHEDRINE SULFATE 50 MG/ML IJ SOLN
INTRAMUSCULAR | Status: DC | PRN
  Administered 2014-12-18: 5 mg via INTRAVENOUS

## 2014-12-18 MED ORDER — CEFAZOLIN SODIUM-DEXTROSE 2-3 GM-% IV SOLR
2.0000 g | INTRAVENOUS | Status: AC
  Administered 2014-12-18: 2 g via INTRAVENOUS

## 2014-12-18 MED ORDER — ONDANSETRON HCL 4 MG PO TABS: 4.0000 mg | ORAL_TABLET | Freq: Four times a day (QID) | ORAL | Status: DC | PRN

## 2014-12-18 MED ORDER — ONDANSETRON HCL 4 MG/2ML IJ SOLN
INTRAMUSCULAR | Status: DC | PRN
  Administered 2014-12-18: 4 mg via INTRAVENOUS

## 2014-12-18 MED ORDER — BUPIVACAINE-EPINEPHRINE 0.25% -1:200000 IJ SOLN
INTRAMUSCULAR | Status: AC
  Filled 2014-12-18: qty 1

## 2014-12-18 MED ORDER — LACTATED RINGERS IR SOLN
Status: DC | PRN
  Administered 2014-12-18: 1

## 2014-12-18 MED ORDER — CEFAZOLIN SODIUM-DEXTROSE 2-3 GM-% IV SOLR
INTRAVENOUS | Status: AC
  Filled 2014-12-18: qty 50

## 2014-12-18 MED ORDER — HEPARIN SODIUM (PORCINE) 5000 UNIT/ML IJ SOLN
5000.0000 [IU] | Freq: Once | INTRAMUSCULAR | Status: AC
  Administered 2014-12-18: 5000 [IU] via SUBCUTANEOUS
  Filled 2014-12-18: qty 1

## 2014-12-18 MED ORDER — ONDANSETRON HCL 4 MG/2ML IJ SOLN
INTRAMUSCULAR | Status: AC
Start: 2014-12-18 — End: 2014-12-18
  Filled 2014-12-18: qty 2

## 2014-12-18 MED ORDER — LIDOCAINE HCL (CARDIAC) 20 MG/ML IV SOLN
INTRAVENOUS | Status: DC | PRN
  Administered 2014-12-18: 100 mg via INTRAVENOUS

## 2014-12-18 MED ORDER — PROPOFOL 10 MG/ML IV BOLUS
INTRAVENOUS | Status: DC | PRN
  Administered 2014-12-18: 180 mg via INTRAVENOUS

## 2014-12-18 MED ORDER — MORPHINE SULFATE 2 MG/ML IJ SOLN: 1.0000 mg | INTRAMUSCULAR | Status: DC | PRN

## 2014-12-18 MED ORDER — SODIUM CHLORIDE 0.9 % IJ SOLN
INTRAMUSCULAR | Status: AC
  Filled 2014-12-18: qty 10

## 2014-12-18 MED ORDER — GLYCOPYRROLATE 0.2 MG/ML IJ SOLN
INTRAMUSCULAR | Status: AC
  Filled 2014-12-18: qty 1

## 2014-12-18 MED ORDER — FENTANYL CITRATE (PF) 100 MCG/2ML IJ SOLN
INTRAMUSCULAR | Status: DC | PRN
  Administered 2014-12-18 (×5): 50 ug via INTRAVENOUS

## 2014-12-18 MED ORDER — MIDAZOLAM HCL 5 MG/5ML IJ SOLN
INTRAMUSCULAR | Status: DC | PRN
  Administered 2014-12-18: 2 mg via INTRAVENOUS

## 2014-12-18 MED ORDER — FENTANYL CITRATE (PF) 250 MCG/5ML IJ SOLN
INTRAMUSCULAR | Status: AC
  Filled 2014-12-18: qty 5

## 2014-12-18 MED ORDER — ROCURONIUM BROMIDE 100 MG/10ML IV SOLN
INTRAVENOUS | Status: AC
Start: 2014-12-18 — End: 2014-12-18
  Filled 2014-12-18: qty 1

## 2014-12-18 MED ORDER — NEOSTIGMINE METHYLSULFATE 10 MG/10ML IV SOLN
INTRAVENOUS | Status: AC
  Filled 2014-12-18: qty 1

## 2014-12-18 MED ORDER — PROMETHAZINE HCL 25 MG/ML IJ SOLN
6.2500 mg | INTRAMUSCULAR | Status: DC | PRN
Start: 2014-12-18 — End: 2014-12-18

## 2014-12-18 SURGICAL SUPPLY — 34 items
APL SKNCLS STERI-STRIP NONHPOA (GAUZE/BANDAGES/DRESSINGS)
APPLIER CLIP ROT 10 11.4 M/L (STAPLE) ×2
APR CLP MED LRG 11.4X10 (STAPLE) ×1
BAG SPEC RTRVL 10 TROC 200 (ENDOMECHANICALS)
BENZOIN TINCTURE PRP APPL 2/3 (GAUZE/BANDAGES/DRESSINGS) IMPLANT
CABLE HIGH FREQUENCY MONO STRZ (ELECTRODE) IMPLANT
CATH REDDICK CHOLANGI 4FR 50CM (CATHETERS) ×2 IMPLANT
CLIP APPLIE ROT 10 11.4 M/L (STAPLE) ×1 IMPLANT
COVER MAYO STAND STRL (DRAPES) ×2 IMPLANT
DECANTER SPIKE VIAL GLASS SM (MISCELLANEOUS) ×2 IMPLANT
DRAPE C-ARM 42X120 X-RAY (DRAPES) ×2 IMPLANT
DRAPE LAPAROSCOPIC ABDOMINAL (DRAPES) ×2 IMPLANT
ELECT REM PT RETURN 9FT ADLT (ELECTROSURGICAL) ×2
ELECTRODE REM PT RTRN 9FT ADLT (ELECTROSURGICAL) ×1 IMPLANT
GLOVE BIOGEL M 8.0 STRL (GLOVE) ×2 IMPLANT
GOWN STRL REUS W/TWL XL LVL3 (GOWN DISPOSABLE) ×8 IMPLANT
HEMOSTAT SURGICEL 4X8 (HEMOSTASIS) IMPLANT
IV CATH 14GX2 1/4 (CATHETERS) ×2 IMPLANT
KIT BASIN OR (CUSTOM PROCEDURE TRAY) ×2 IMPLANT
LIQUID BAND (GAUZE/BANDAGES/DRESSINGS) ×2 IMPLANT
POUCH RETRIEVAL ECOSAC 10 (ENDOMECHANICALS) IMPLANT
POUCH RETRIEVAL ECOSAC 10MM (ENDOMECHANICALS)
SCISSORS LAP 5X45 EPIX DISP (ENDOMECHANICALS) ×2 IMPLANT
SCRUB PCMX 4 OZ (MISCELLANEOUS) ×2 IMPLANT
SET IRRIG TUBING LAPAROSCOPIC (IRRIGATION / IRRIGATOR) ×2 IMPLANT
SLEEVE XCEL OPT CAN 5 100 (ENDOMECHANICALS) ×4 IMPLANT
STRIP CLOSURE SKIN 1/2X4 (GAUZE/BANDAGES/DRESSINGS) IMPLANT
SUT VIC AB 4-0 SH 18 (SUTURE) ×2 IMPLANT
SYR 20CC LL (SYRINGE) ×2 IMPLANT
TOWEL OR 17X26 10 PK STRL BLUE (TOWEL DISPOSABLE) ×2 IMPLANT
TRAY LAPAROSCOPIC (CUSTOM PROCEDURE TRAY) ×2 IMPLANT
TROCAR BLADELESS OPT 5 100 (ENDOMECHANICALS) ×2 IMPLANT
TROCAR XCEL BLUNT TIP 100MML (ENDOMECHANICALS) IMPLANT
TROCAR XCEL NON-BLD 11X100MML (ENDOMECHANICALS) ×2 IMPLANT

## 2014-12-18 NOTE — Op Note (Signed)
NIKIESHA MILFORD   12/18/2014  4:17 PM  Procedure: Laparoscopic Cholecystectomy with intraoperative cholangiogram  Surgeon: Catalina Antigua B. Hassell Done, MD, FACS Asst:  none  Anes:  General  Drains:  None  Findings: Chronic cholecystitis  Description of Procedure: The patient was taken to OR 1 and given general anesthesia.  The patient was prepped with PCMX and draped sterilely. A time out was performed.  Access to the abdomen was achieved with a 5 mm Optiview through the right upper quadrant without difficulty.  Port placement included three 5 mm trocars and a 12 Flowseal through the upper midline.    The gallbladder was visualized and the fundus was grasped and the gallbladder was elevated. Traction on the infundibulum allowed for successful demonstration of the critical view. Inflammatory changes were minimal.  The cystic duct was identified and clipped up on the gallbladder and an incision was made in the cystic duct and the Reddick catheter was inserted after milking the cystic duct of any debris. A dynamic cholangiogram was performed which demonstrated a small intrahepatic duct and free flow into the duodenum.    The cystic duct was then triple clipped and divided, the cystic artery was double clipped and divided and then the gallbladder was removed from the gallbladder bed. Removal of the gallbladder from the gallbladder bed was accomplished with the hook electrocautery without stone spillage.  The gallbladder was then placed in a bag and brought out through one of the trocar sites. The gallbladder bed was inspected and no bleeding or bile leaks were seen.  The 12 mm fascia was approximated with 0 vicryl.    Incisions were injected with Exparel and closed with 4-0 Vicryl and Liquiban on the skin.  Sponge and needle count were correct.    The patient was taken to the recovery room in satisfactory condition.

## 2014-12-18 NOTE — Transfer of Care (Signed)
Immediate Anesthesia Transfer of Care Note  Patient: Tami Hughes  Procedure(s) Performed: Procedure(s): LAPAROSCOPIC CHOLECYSTECTOMY WITH INTRAOPERATIVE CHOLANGIOGRAM (N/A)  Patient Location: PACU  Anesthesia Type:General  Level of Consciousness: sedated  Airway & Oxygen Therapy: Patient Spontanous Breathing and Patient connected to face mask oxygen  Post-op Assessment: Report given to RN and Post -op Vital signs reviewed and stable  Post vital signs: Reviewed and stable  Last Vitals:  Filed Vitals:   12/18/14 1206  BP: 155/82  Pulse: 63  Temp: 36.5 C  Resp: 18    Complications: No apparent anesthesia complications

## 2014-12-18 NOTE — Anesthesia Preprocedure Evaluation (Addendum)
Anesthesia Evaluation  Patient identified by MRN, date of birth, ID band Patient awake    Reviewed: Allergy & Precautions, NPO status , Patient's Chart, lab work & pertinent test results  Airway Mallampati: II  TM Distance: >3 FB Neck ROM: Full    Dental   Pulmonary neg pulmonary ROS,  breath sounds clear to auscultation        Cardiovascular hypertension, + Peripheral Vascular Disease Rhythm:Regular Rate:Normal     Neuro/Psych    GI/Hepatic Neg liver ROS, GERD-  ,  Endo/Other  negative endocrine ROS  Renal/GU negative Renal ROS     Musculoskeletal   Abdominal   Peds  Hematology   Anesthesia Other Findings   Reproductive/Obstetrics                          Anesthesia Physical Anesthesia Plan  ASA: III  Anesthesia Plan: General   Post-op Pain Management:    Induction: Intravenous  Airway Management Planned: Oral ETT  Additional Equipment:   Intra-op Plan:   Post-operative Plan: Extubation in OR  Informed Consent: I have reviewed the patients History and Physical, chart, labs and discussed the procedure including the risks, benefits and alternatives for the proposed anesthesia with the patient or authorized representative who has indicated his/her understanding and acceptance.   Dental advisory given  Plan Discussed with: CRNA and Anesthesiologist  Anesthesia Plan Comments:         Anesthesia Quick Evaluation

## 2014-12-18 NOTE — Anesthesia Procedure Notes (Signed)
Procedure Name: Intubation Date/Time: 12/18/2014 2:43 PM Performed by: Carleene Cooper A Pre-anesthesia Checklist: Patient identified, Timeout performed, Emergency Drugs available, Suction available and Patient being monitored Patient Re-evaluated:Patient Re-evaluated prior to inductionOxygen Delivery Method: Circle system utilized Preoxygenation: Pre-oxygenation with 100% oxygen (RSI with cricoid pressure by Dr. Oletta Lamas) Intubation Type: Rapid sequence and Cricoid Pressure applied Laryngoscope Size: Mac and 4 Grade View: Grade I Tube type: Oral Tube size: 7.5 mm Number of attempts: 1 Airway Equipment and Method: Stylet Placement Confirmation: breath sounds checked- equal and bilateral,  ETT inserted through vocal cords under direct vision and positive ETCO2 Secured at: 21 cm Tube secured with: Tape Dental Injury: Teeth and Oropharynx as per pre-operative assessment

## 2014-12-18 NOTE — Anesthesia Postprocedure Evaluation (Signed)
  Anesthesia Post-op Note  Patient: Tami Hughes  Procedure(s) Performed: Procedure(s): LAPAROSCOPIC CHOLECYSTECTOMY WITH INTRAOPERATIVE CHOLANGIOGRAM (N/A)  Patient Location: PACU  Anesthesia Type:General  Level of Consciousness: awake  Airway and Oxygen Therapy: Patient Spontanous Breathing  Post-op Pain: mild  Post-op Assessment: Post-op Vital signs reviewed              Post-op Vital Signs: Reviewed  Last Vitals:  Filed Vitals:   12/18/14 1747  BP: 119/60  Pulse: 65  Temp: 36.5 C  Resp: 15    Complications: No apparent anesthesia complications

## 2014-12-18 NOTE — Interval H&P Note (Signed)
History and Physical Interval Note:  12/18/2014 2:18 PM  Tami Hughes  has presented today for surgery, with the diagnosis of CHOLECYSTITIS  The various methods of treatment have been discussed with the patient and family. After consideration of risks, benefits and other options for treatment, the patient has consented to  Procedure(s): LAPAROSCOPIC CHOLECYSTECTOMY WITH INTRAOPERATIVE CHOLANGIOGRAM (N/A) as a surgical intervention .  The patient's history has been reviewed, patient examined, no change in status, stable for surgery.  I have reviewed the patient's chart and labs.  Questions were answered to the patient's satisfaction.     Stalnaker,Tami Hughes

## 2014-12-18 NOTE — H&P (View-Only) (Signed)
Tami Hughes 12/13/2014 1:35 PM Location: [facility] Patient #: [id number] DOB: December 03, 1955 Widowed / Language: Cleophus Molt / Race: Black or African American Female  History of Present Illness Rodman Key B. Hassell Done MD; 12/13/2014 2:23 PM) Patient words: gallbladder :  Gallbladder: Moderate cholelithiasis with the largest stone measuring 1.1 cm. No gallbladder wall thickening. Negative sonographic Murphy sign. No adjacent free fluid.  Common bile duct: Diameter: 3 mm.  Has been having classic symptoms of midepigastric pain radiating into the back. Pain, nausea, etc. Most of that has resolved. Had elevated LFTs and referred to Silvano Rusk who obtained ultrasound-I gave her a copy of the scan. She may have smouldering subacute cholecystitis but her ultrasound didn't show it.  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   Lap chole explained to her in detail. I went over CBD injury and other issues. Possibility of ope chole. Also discussed weight loss, diet and Qsymia.  The patient is a 59 year old female    Other Problems Elbert Ewings, [place]; 12/13/2014 1:36 PM) Back Pain Gastroesophageal Reflux Disease High blood pressure Hypercholesterolemia  Past Surgical History Elbert Ewings, CMA; 12/13/2014 1:36 PM) Colon Polyp Removal - Open Hysterectomy (not due to cancer) - Partial Tonsillectomy  Diagnostic Studies History Elbert Ewings, CMA; 12/13/2014 1:36 PM) Colonoscopy 1-5 years ago Mammogram within last year Pap Smear 1-5 years ago  Allergies Elbert Ewings, CMA; 12/13/2014 1:36 PM) No Known Drug Allergies06/17/2016  Medication History Elbert Ewings, CMA; 12/13/2014 1:38 PM) Geronimo Boot XT (240MG  Capsule ER 24HR, Oral) Active. Lisinopril-Hydrochlorothiazide (20-12.5MG  Tablet, Oral) Active. Pantoprazole Sodium (40MG  Tablet DR, Oral) Active. Gabapentin (100MG  Capsule, Oral) Active. Citrucel (500MG  Tablet,  Oral) Active. Qsymia (7.5-46MG  Capsule ER 24HR, Oral) Active. Zantac (150MG  Tablet, Oral as needed) Active. Valtrex (500MG  Tablet, Oral daily) Active. Vitamin E (400UNIT Tablet, Oral) Active. Medications Reconciled  Social History Elbert Ewings, [place]; 12/13/2014 1:36 PM) Alcohol use Occasional alcohol use. Caffeine use Carbonated beverages, Coffee. No drug use Tobacco use Never smoker.  Family History Elbert Ewings, [place]; 12/13/2014 1:36 PM) Arthritis Mother. Breast Cancer Mother. Colon Polyps Mother. Hypertension Mother.  Pregnancy / Birth History Elbert Ewings, CMA; 12/13/2014 1:36 PM) Age at menarche 67 years. Gravida 2 Maternal age 69-25 Para 2  Review of Systems Elbert Ewings CMA; 12/13/2014 1:36 PM) General Not Present- Appetite Loss, Chills, Fatigue, Fever, Night Sweats, Weight Gain and Weight Loss. Skin Not Present- Change in Wart/Mole, Dryness, Hives, Jaundice, New Lesions, Non-Healing Wounds, Rash and Ulcer. HEENT Present- Seasonal Allergies, Sinus Pain and Wears glasses/contact lenses. Not Present- Earache, Hearing Loss, Hoarseness, Nose Bleed, Oral Ulcers, Ringing in the Ears, Sore Throat, Visual Disturbances and Yellow Eyes. Respiratory Not Present- Bloody sputum, Chronic Cough, Difficulty Breathing, Snoring and Wheezing. Cardiovascular Not Present- Chest Pain, Difficulty Breathing Lying Down, Leg Cramps, Palpitations, Rapid Heart Rate, Shortness of Breath and Swelling of Extremities. Gastrointestinal Present- Abdominal Pain, Bloating, Constipation, Excessive gas and Indigestion. Not Present- Bloody Stool, Change in Bowel Habits, Chronic diarrhea, Difficulty Swallowing, Gets full quickly at meals, Hemorrhoids, Nausea, Rectal Pain and Vomiting. Musculoskeletal Present- Back Pain. Not Present- Joint Pain, Joint Stiffness, Muscle Pain, Muscle Weakness and Swelling of Extremities. Neurological Not Present- Decreased Memory, Fainting, Headaches, Numbness, Seizures,  Tingling, Tremor, Trouble walking and Weakness. Psychiatric Not Present- Anxiety, Bipolar, Change in Sleep Pattern, Depression, Fearful and Frequent crying. Endocrine Not Present- Cold Intolerance, Excessive Hunger, Hair Changes, Heat Intolerance, Hot flashes and New Diabetes. Hematology Not Present-  Easy Bruising, Excessive bleeding, Gland problems, HIV and Persistent Infections.   Vitals Elbert Ewings CMA; 12/13/2014 1:39 PM) 12/13/2014 1:38 PM Weight: 214 lb Height: 63in Body Surface Area: 2.08 m Body Mass Index: 37.91 kg/m Temp.: 98.2F(Oral)  Pulse: 77 (Regular)  BP: 128/78 (Sitting, Left Arm, Standard)    Physical Exam (Rowland Ericsson B. Hassell Done MD; 12/13/2014 2:19 PM) General Note: HEENT unremarkable Neck supple without adenopathy or bruits Chest clear Heart SR without murmurs Abdomen is nontender GU not examined prior vag hysterctomy Ext FROM no DVT Neuro alert and oriented x 3     Assessment & Plan Rodman Key B. Hassell Done MD; 12/13/2014 2:20 PM) CHOLECYSTITIS (575.10  K81.9) Impression: Will get her scheduled asap for lap chole IOC  Matt B. Hassell Done, MD, [facility] [phone] beeper [phone]  12/13/2014 2:32 PM

## 2014-12-19 ENCOUNTER — Encounter (HOSPITAL_COMMUNITY): Payer: Self-pay | Admitting: Surgery

## 2014-12-19 DIAGNOSIS — K8010 Calculus of gallbladder with chronic cholecystitis without obstruction: Secondary | ICD-10-CM | POA: Diagnosis not present

## 2014-12-19 LAB — CBC
HEMATOCRIT: 37.8 % (ref 36.0–46.0)
Hemoglobin: 12.6 g/dL (ref 12.0–15.0)
MCH: 29.9 pg (ref 26.0–34.0)
MCHC: 33.3 g/dL (ref 30.0–36.0)
MCV: 89.6 fL (ref 78.0–100.0)
Platelets: 219 10*3/uL (ref 150–400)
RBC: 4.22 MIL/uL (ref 3.87–5.11)
RDW: 13.3 % (ref 11.5–15.5)
WBC: 10.7 10*3/uL — ABNORMAL HIGH (ref 4.0–10.5)

## 2014-12-19 LAB — COMPREHENSIVE METABOLIC PANEL
ALK PHOS: 86 U/L (ref 38–126)
ALT: 75 U/L — ABNORMAL HIGH (ref 14–54)
AST: 61 U/L — ABNORMAL HIGH (ref 15–41)
Albumin: 4 g/dL (ref 3.5–5.0)
Anion gap: 9 (ref 5–15)
BUN: 11 mg/dL (ref 6–20)
CALCIUM: 9.4 mg/dL (ref 8.9–10.3)
CO2: 27 mmol/L (ref 22–32)
Chloride: 105 mmol/L (ref 101–111)
Creatinine, Ser: 0.98 mg/dL (ref 0.44–1.00)
GFR calc non Af Amer: >60 mL/min (ref >60)
GLUCOSE: 131 mg/dL — AB (ref 65–99)
Potassium: 4.6 mmol/L (ref 3.5–5.1)
Sodium: 141 mmol/L (ref 135–145)
TOTAL PROTEIN: 7 g/dL (ref 6.5–8.1)
Total Bilirubin: 0.4 mg/dL (ref 0.3–1.2)

## 2014-12-19 MED ORDER — HYDROCODONE-ACETAMINOPHEN 5-325 MG PO TABS: 1.0000 | ORAL_TABLET | ORAL | Status: DC | PRN

## 2014-12-19 NOTE — Progress Notes (Signed)
Discharge instructions and prescriptions given to patient.  Questions answered

## 2014-12-19 NOTE — Discharge Summary (Signed)
Physician Discharge Summary  Patient ID: AMELLIA PANIK MRN: [id number] DOB/AGE: 1956-06-22 59 y.o.  Admit date: 12/18/2014 Discharge date: 12/19/2014  Admission Diagnoses:  Gallstones and chronic cholecystitis  Discharge Diagnoses:  same  Principal Problem:   Status post laparoscopic cholecystectomy June 20156   Surgery:  Lap chole with IOC  Discharged Condition: improved  Hospital Course:   Had surgery in the PM of 6/22.  Did well overnight and ready for discharge.   Consults: none  Significant Diagnostic Studies: IOC    Discharge Exam: Blood pressure 120/72, pulse 85, temperature 98.1 F (36.7 C), temperature source Oral, resp. rate 18, height 5\' 3"  (1.6 m), weight 96.389 kg (212 lb 8 oz), SpO2 97 %. Incisions OK  Disposition:   Discharge Instructions    Diet Carb Modified    Complete by:  As directed      Discharge instructions    Complete by:  As directed   May shower and shampoo ad lib Advance diet as tolerated.  No special diet after cholecystectomy     Increase activity slowly    Complete by:  As directed      No wound care    Complete by:  As directed             Medication List    TAKE these medications        CARTIA XT 240 MG 24 hr capsule  Generic drug:  diltiazem  TAKE ONE CAPSULE BY MOUTH ONCE DAILY     HYDROcodone-acetaminophen 5-325 MG per tablet  Commonly known as:  NORCO/VICODIN  Take 1-2 tablets by mouth every 4 (four) hours as needed for moderate pain.     ibuprofen 200 MG tablet  Commonly known as:  ADVIL,MOTRIN  Take 800 mg by mouth every 6 (six) hours as needed.     lisinopril-hydrochlorothiazide 20-12.5 MG per tablet  Commonly known as:  PRINZIDE,ZESTORETIC  TAKE ONE TABLET BY MOUTH ONCE DAILY.     pantoprazole 40 MG tablet  Commonly known as:  PROTONIX  Take 1 tablet (40 mg total) by mouth daily.     Phentermine-Topiramate 7.5-46 MG Cp24  Commonly known as:  QSYMIA  Take 1 capsule by mouth daily.     ranitidine 150 MG  capsule  Commonly known as:  ZANTAC  Take 1 capsule (150 mg total) by mouth as needed.     valACYclovir 1000 MG tablet  Commonly known as:  VALTREX  Take 1 tablet (1,000 mg total) by mouth 3 (three) times daily.     vitamin E 400 UNIT capsule  Generic drug:  vitamin E  Take 400 Units by mouth daily.           Follow-up Information    Follow up with Johnathan Hausen B, MD. Schedule an appointment as soon as possible for a visit in 4 weeks.   Specialty:  General Surgery   Contact information:   [address] [place] [address] [phone]       Signed: LIANN, SPAETH 12/19/2014, 8:18 AM

## 2014-12-31 ENCOUNTER — Encounter: Payer: BLUE CROSS/BLUE SHIELD | Admitting: Internal Medicine

## 2015-03-04 ENCOUNTER — Other Ambulatory Visit: Payer: Self-pay | Admitting: Internal Medicine

## 2015-03-05 ENCOUNTER — Other Ambulatory Visit: Payer: Self-pay | Admitting: Internal Medicine

## 2015-04-23 ENCOUNTER — Other Ambulatory Visit: Payer: Self-pay | Admitting: Gynecology

## 2015-04-23 DIAGNOSIS — N6310 Unspecified lump in the right breast, unspecified quadrant: Secondary | ICD-10-CM

## 2015-04-24 ENCOUNTER — Other Ambulatory Visit: Payer: Self-pay

## 2015-04-24 ENCOUNTER — Other Ambulatory Visit: Payer: Self-pay | Admitting: Internal Medicine

## 2015-04-24 ENCOUNTER — Encounter: Payer: Self-pay | Admitting: Physician Assistant

## 2015-04-24 MED ORDER — PANTOPRAZOLE SODIUM 40 MG PO TBEC: 40.0000 mg | DELAYED_RELEASE_TABLET | Freq: Every day | ORAL | Status: DC

## 2015-04-24 MED ORDER — VALACYCLOVIR HCL 1 G PO TABS
1000.0000 mg | ORAL_TABLET | Freq: Two times a day (BID) | ORAL | Status: DC
Start: 2015-04-24 — End: 2015-05-12

## 2015-05-08 ENCOUNTER — Other Ambulatory Visit: Payer: Self-pay

## 2015-05-08 ENCOUNTER — Other Ambulatory Visit: Payer: Self-pay | Admitting: Gynecology

## 2015-05-08 DIAGNOSIS — N6310 Unspecified lump in the right breast, unspecified quadrant: Secondary | ICD-10-CM

## 2015-05-12 ENCOUNTER — Other Ambulatory Visit: Payer: Self-pay | Admitting: Internal Medicine

## 2015-05-12 MED ORDER — VALACYCLOVIR HCL 1 G PO TABS: 1000.0000 mg | ORAL_TABLET | Freq: Two times a day (BID) | ORAL | Status: DC

## 2015-05-12 NOTE — Addendum Note (Signed)
Addended by: Earnstine Regal on: 05/12/2015 10:58 AM   Modules accepted: Orders

## 2015-05-13 ENCOUNTER — Ambulatory Visit
Admission: RE | Admit: 2015-05-13 | Discharge: 2015-05-13 | Disposition: A | Discharge status: Home / Self Care | Payer: BLUE CROSS/BLUE SHIELD | Source: Ambulatory Visit | Attending: Gynecology | Admitting: Gynecology

## 2015-05-13 DIAGNOSIS — N6310 Unspecified lump in the right breast, unspecified quadrant: Secondary | ICD-10-CM

## 2015-05-19 ENCOUNTER — Encounter: Payer: Self-pay | Admitting: Internal Medicine

## 2015-05-30 ENCOUNTER — Ambulatory Visit (INDEPENDENT_AMBULATORY_CARE_PROVIDER_SITE_OTHER): Payer: BLUE CROSS/BLUE SHIELD

## 2015-05-30 DIAGNOSIS — Z23 Encounter for immunization: Secondary | ICD-10-CM | POA: Diagnosis not present

## 2015-07-01 ENCOUNTER — Ambulatory Visit (INDEPENDENT_AMBULATORY_CARE_PROVIDER_SITE_OTHER): Payer: BLUE CROSS/BLUE SHIELD | Admitting: Internal Medicine

## 2015-07-01 ENCOUNTER — Encounter: Payer: Self-pay | Admitting: Internal Medicine

## 2015-07-01 VITALS — BP 152/74 | HR 84 | Temp 98.4°F | Resp 16 | Ht 63.0 in | Wt 222.0 lb

## 2015-07-01 DIAGNOSIS — E785 Hyperlipidemia, unspecified: Secondary | ICD-10-CM | POA: Diagnosis not present

## 2015-07-01 DIAGNOSIS — E669 Obesity, unspecified: Secondary | ICD-10-CM | POA: Diagnosis not present

## 2015-07-01 DIAGNOSIS — I10 Essential (primary) hypertension: Secondary | ICD-10-CM

## 2015-07-01 DIAGNOSIS — Z0000 Encounter for general adult medical examination without abnormal findings: Secondary | ICD-10-CM

## 2015-07-01 DIAGNOSIS — K219 Gastro-esophageal reflux disease without esophagitis: Secondary | ICD-10-CM | POA: Diagnosis not present

## 2015-07-01 MED ORDER — PANTOPRAZOLE SODIUM 40 MG PO TBEC: 40.0000 mg | DELAYED_RELEASE_TABLET | Freq: Two times a day (BID) | ORAL | Status: DC

## 2015-07-01 MED ORDER — DICLOFENAC SODIUM 1 % TD GEL: 4.0000 g | Freq: Four times a day (QID) | TRANSDERMAL | Status: DC

## 2015-07-01 NOTE — Progress Notes (Signed)
Subjective:    Patient ID: Tami Hughes, female    DOB: 10-12-1955, 60 y.o.   MRN: [id number]  HPI She is here to establish with a new pcp and for a PE.  GERD:  She is taking protonix once daily before breakfast.  Zantac once daily as needed.  She is having indigestion daily.  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  In June she had her gallbladder removed in her abdomen is still sore. She thinks this is related to doing too much too quickly. The heartburn has been worse since having the gallbladder removed. She tends to drink one large cup of coffee in the morning. She does not eat as well as she should.  Hyperlipidemia:  She has never been on medication in the past.  She would want to try lifestyle changes before considering medicaiton.   Hypertension: She is taking her medication daily as prescribed. She is not monitoring her blood pressure at home. She is not always compliant with a low sodium diet.  Medications and allergies reviewed with patient and updated if appropriate.  Patient Active Problem List   Diagnosis Date Noted  . Status post laparoscopic cholecystectomy June 20156 12/18/2014  . Herpes simplex without mention of complication [id number]  . Menopause syndrome 08/21/2012  . Obesity, Class II, BMI 35-39.9 05/02/2012  . Personal history of colonic polyps 01/27/2012  . Menopausal state 03/22/2011  . VENOUS INSUFFICIENCY, LEGS 08/28/2010  . PARESTHESIA 08/28/2010  . Hyperlipidemia 02/22/2008  . Essential hypertension 02/22/2008  . GERD 02/22/2008    Current Outpatient Prescriptions on File Prior to Visit  Medication Sig Dispense Refill  . diltiazem (CARTIA XT) 240 MG 24 hr capsule Take 1 capsule (240 mg total) by mouth daily. 90 capsule 3  . lisinopril-hydrochlorothiazide (PRINZIDE,ZESTORETIC) 20-12.5 MG per tablet Take 1 tablet by mouth daily. 90 tablet 3  . valACYclovir (VALTREX) 1000 MG tablet Take 1 tablet (1,000 mg total) by mouth 2 (two) times  daily. 20 tablet 0  . vitamin E (VITAMIN E) 400 UNIT capsule Take 400 Units by mouth daily.       No current facility-administered medications on file prior to visit.    Past Medical History  Diagnosis Date  . Hypertension   . Polyp of colon 2009    adenoma benign  . Dyslipidemia   . Shingles     recurrent: 08/2010, 10/2013    Past Surgical History  Procedure Laterality Date  . Abdominal hysterectomy    . Labioplasty    . Vulvectomy      partial vulvectomy secondary to labial hypertrophy..  . Pubovaginal sling  05/08/2010    stress urinary incontinence  . Colonoscopy    . Cholecystectomy N/A 12/18/2014    Procedure: LAPAROSCOPIC CHOLECYSTECTOMY WITH INTRAOPERATIVE CHOLANGIOGRAM;  Surgeon: Johnathan Hausen, MD;  Location: [facility];  Service: General;  Laterality: N/A;    Social History   Social History  . Marital Status: Widowed    Spouse Name: N/A  . Number of Children: 2  . Years of Education: 18   Occupational History  . paralegal    Social History Main Topics  . Smoking status: Never Smoker   . Smokeless tobacco: Never Used  . Alcohol Use: 1.0 oz/week    2 drink(s) per week     Comment: occasionally - wine  . Drug Use: No  . Sexual Activity:    Partners: Male   Other Topics Concern  . None   Social  History Narrative   [employer], [employer] - [employer], [employer] - masters in Energy manager. Married '74 - 27 yrs/widowed. 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 Has high potential for work but will finish master's first. Lives alone.       CPA   Widow, single   2 children, 2 grandchildren   No regular exercise   1 large cup of coffee daily    Review of Systems  Constitutional: Negative for fever, chills and fatigue.  HENT: Negative for hearing loss.   Eyes: Negative for visual disturbance.  Respiratory: Negative for cough, shortness of breath and wheezing.   Cardiovascular: Positive for palpitations (rare). Negative for chest pain  and leg swelling.  Gastrointestinal: Negative for nausea, abdominal pain, diarrhea, constipation and blood in stool.       GERD  Genitourinary: Negative for dysuria and hematuria.  Musculoskeletal: Positive for back pain and arthralgias (knee pain - some arthritis).  Neurological: Positive for headaches (occasional). Negative for dizziness and light-headedness.  Psychiatric/Behavioral: Negative for dysphoric mood. The patient is not nervous/anxious.        Objective:   Filed Vitals:   07/01/15 0807  BP: 152/74  Pulse: 84  Temp: 98.4 F (36.9 C)  Resp: 16   Filed Weights   07/01/15 0807  Weight: 222 lb (100.699 kg)   Body mass index is 39.34 kg/(m^2).   Physical Exam Constitutional: She appears well-developed and well-nourished. No distress.  HENT:  Head: Normocephalic and atraumatic.  Right Ear: External ear normal.  Left Ear: External ear normal.  Mouth/Throat: Oropharynx is clear and moist.  Normal bilateral ear canals and tympanic membranes  Eyes: Conjunctivae and EOM are normal.  Neck: Neck supple. No tracheal deviation present. No thyromegaly present.  No carotid bruit  Cardiovascular: Normal rate, regular rhythm and normal heart sounds.   No murmur heard. Pulmonary/Chest: Effort normal and breath sounds normal. No respiratory distress. She has no wheezes. She has no rales.  Abdominal: Soft. She exhibits no distension. There is no tenderness.  Musculoskeletal: She exhibits no edema.  Lymphadenopathy:    She has no cervical adenopathy.  Skin: Skin is warm and dry. She is not diaphoretic.  Psychiatric: She has a normal mood and affect. Her behavior is normal.         Assessment & Plan:   Physical exam: Screening blood work Immunizations up-to-date except shingles vaccine, Which we discussed Colonoscopy up-to-date Mammogram up-to-date Gyn - had a hysterectomy, still has ovaries, not following with gyn Dexa - done in 2013 Eye exams and dental exams up to  date EKG up to date Exercise-not currently exercising-stressed the importance of regular exercise. Discussed at length that she needs to make this a priority and she agrees. Weight and will be, BMI more than 39. Stressed and improving her diet, decreasing portions and increasing her exercise Skin -no concerns Substance abuse-no evidence of substance abuse  See problem list for assessment of chronic conditions

## 2015-07-01 NOTE — Progress Notes (Signed)
Pre visit review using our clinic review tool, if applicable. No additional management support is needed unless otherwise documented below in the visit note. 

## 2015-07-01 NOTE — Patient Instructions (Signed)
We have reviewed your prior records including labs and tests today.  Test(s) ordered today. Your results will be released to [facility] (or called to you) after review, usually within 72hours after test completion. If any changes need to be made, you will be notified at that same time.  All other Health Maintenance issues reviewed.   All recommended immunizations and age-appropriate screenings are up-to-date/discussed.  No immunizations administered today.   Medications reviewed and updated.  Changes include restarting voltaren gel and increasing protonix to twice a day.   Your prescription(s) have been submitted to your pharmacy. Please take as directed and contact our office if you believe you are having problem(s) with the medication(s).  Monitor your BP daily.  On average it should be less than 140/90.  Health Maintenance, Female Adopting a healthy lifestyle and getting preventive care can go a long way to promote health and wellness. Talk with your health care provider about what schedule of regular examinations is right for you. This is a good chance for you to check in with your provider about disease prevention and staying healthy. In between checkups, there are plenty of things you can do on your own. Experts have done a lot of research about which lifestyle changes and preventive measures are most likely to keep you healthy. Ask your health care provider for more information. WEIGHT AND DIET  Eat a healthy diet  Be sure to include plenty of vegetables, fruits, low-fat dairy products, and lean protein.  Do not eat a lot of foods high in solid fats, added sugars, or salt.  Get regular exercise. This is one of the most important things you can do for your health.  Most adults should exercise for at least 150 minutes each week. The exercise should increase your heart rate and make you sweat (moderate-intensity exercise).  Most adults should also do strengthening exercises at least  twice a week. This is in addition to the moderate-intensity exercise.  Maintain a healthy weight  Body mass index (BMI) is a measurement that can be used to identify possible weight problems. It estimates body fat based on height and weight. Your health care provider can help determine your BMI and help you achieve or maintain a healthy weight.  For females 46 years of age and older:   A BMI below 18.5 is considered underweight.  A BMI of 18.5 to 24.9 is normal.  A BMI of 25 to 29.9 is considered overweight.  A BMI of 30 and above is considered obese.  Watch levels of cholesterol and blood lipids  You should start having your blood tested for lipids and cholesterol at 61 years of age, then have this test every 5 years.  You may need to have your cholesterol levels checked more often if:  Your lipid or cholesterol levels are high.  You are older than 61 years of age.  You are at high risk for heart disease.  CANCER SCREENING   Lung Cancer  Lung cancer screening is recommended for adults 50-2 years old who are at high risk for lung cancer because of a history of smoking.  A yearly low-dose CT scan of the lungs is recommended for people who:  Currently smoke.  Have quit within the past 15 years.  Have at least a 30-pack-year history of smoking. A pack year is smoking an average of one pack of cigarettes a day for 1 year.  Yearly screening should continue until it has been 15 years since you quit.  Yearly screening should stop if you develop a health problem that would prevent you from having lung cancer treatment.  Breast Cancer  Practice breast self-awareness. This means understanding how your breasts normally appear and feel.  It also means doing regular breast self-exams. Let your health care provider know about any changes, no matter how small.  If you are in your 20s or 30s, you should have a clinical breast exam (CBE) by a health care provider every 1-3 years  as part of a regular health exam.  If you are 1 or older, have a CBE every year. Also consider having a breast X-ray (mammogram) every year.  If you have a family history of breast cancer, talk to your health care provider about genetic screening.  If you are at high risk for breast cancer, talk to your health care provider about having an MRI and a mammogram every year.  Breast cancer gene (BRCA) assessment is recommended for women who have family members with BRCA-related cancers. BRCA-related cancers include:  Breast.  Ovarian.  Tubal.  Peritoneal cancers.  Results of the assessment will determine the need for genetic counseling and BRCA1 and BRCA2 testing. Cervical Cancer Your health care provider may recommend that you be screened regularly for cancer of the pelvic organs (ovaries, uterus, and vagina). This screening involves a pelvic examination, including checking for microscopic changes to the surface of your cervix (Pap test). You may be encouraged to have this screening done every 3 years, beginning at age 37.  For women ages 57-65, health care providers may recommend pelvic exams and Pap testing every 3 years, or they may recommend the Pap and pelvic exam, combined with testing for human papilloma virus (HPV), every 5 years. Some types of HPV increase your risk of cervical cancer. Testing for HPV may also be done on women of any age with unclear Pap test results.  Other health care providers may not recommend any screening for nonpregnant women who are considered low risk for pelvic cancer and who do not have symptoms. Ask your health care provider if a screening pelvic exam is right for you.  If you have had past treatment for cervical cancer or a condition that could lead to cancer, you need Pap tests and screening for cancer for at least 20 years after your treatment. If Pap tests have been discontinued, your risk factors (such as having a new sexual partner) need to be  reassessed to determine if screening should resume. Some women have medical problems that increase the chance of getting cervical cancer. In these cases, your health care provider may recommend more frequent screening and Pap tests. Colorectal Cancer  This type of cancer can be detected and often prevented.  Routine colorectal cancer screening usually begins at 61 years of age and continues through 61 years of age.  Your health care provider may recommend screening at an earlier age if you have risk factors for colon cancer.  Your health care provider may also recommend using home test kits to check for hidden blood in the stool.  A small camera at the end of a tube can be used to examine your colon directly (sigmoidoscopy or colonoscopy). This is done to check for the earliest forms of colorectal cancer.  Routine screening usually begins at age 15.  Direct examination of the colon should be repeated every 5-10 years through 61 years of age. However, you may need to be screened more often if early forms of precancerous polyps or  small growths are found. Skin Cancer  Check your skin from head to toe regularly.  Tell your health care provider about any new moles or changes in moles, especially if there is a change in a mole's shape or color.  Also tell your health care provider if you have a mole that is larger than the size of a pencil eraser.  Always use sunscreen. Apply sunscreen liberally and repeatedly throughout the day.  Protect yourself by wearing long sleeves, pants, a wide-brimmed hat, and sunglasses whenever you are outside. HEART DISEASE, DIABETES, AND HIGH BLOOD PRESSURE   High blood pressure causes heart disease and increases the risk of stroke. High blood pressure is more likely to develop in:  People who have blood pressure in the high end of the normal range (130-139/85-89 mm Hg).  People who are overweight or obese.  People who are African American.  If you are  58-59 years of age, have your blood pressure checked every 3-5 years. If you are 47 years of age or older, have your blood pressure checked every year. You should have your blood pressure measured twice--once when you are at a hospital or clinic, and once when you are not at a hospital or clinic. Record the average of the two measurements. To check your blood pressure when you are not at a hospital or clinic, you can use:  An automated blood pressure machine at a pharmacy.  A home blood pressure monitor.  If you are between 71 years and 71 years old, ask your health care provider if you should take aspirin to prevent strokes.  Have regular diabetes screenings. This involves taking a blood sample to check your fasting blood sugar level.  If you are at a normal weight and have a low risk for diabetes, have this test once every three years after 61 years of age.  If you are overweight and have a high risk for diabetes, consider being tested at a younger age or more often. PREVENTING INFECTION  Hepatitis B  If you have a higher risk for hepatitis B, you should be screened for this virus. You are considered at high risk for hepatitis B if:  You were born in a country where hepatitis B is common. Ask your health care provider which countries are considered high risk.  Your parents were born in a high-risk country, and you have not been immunized against hepatitis B (hepatitis B vaccine).  You have HIV or AIDS.  You use needles to inject street drugs.  You live with someone who has hepatitis B.  You have had sex with someone who has hepatitis B.  You get hemodialysis treatment.  You take certain medicines for conditions, including cancer, organ transplantation, and autoimmune conditions. Hepatitis C  Blood testing is recommended for:  Everyone born from [birth year] through 1966.  Anyone with known risk factors for hepatitis C. Sexually transmitted infections (STIs)  You should be screened  for sexually transmitted infections (STIs) including gonorrhea and chlamydia if:  You are sexually active and are younger than 61 years of age.  You are older than 61 years of age and your health care provider tells you that you are at risk for this type of infection.  Your sexual activity has changed since you were last screened and you are at an increased risk for chlamydia or gonorrhea. Ask your health care provider if you are at risk.  If you do not have HIV, but are at risk, it may be  recommended that you take a prescription medicine daily to prevent HIV infection. This is called pre-exposure prophylaxis (PrEP). You are considered at risk if:  You are sexually active and do not regularly use condoms or know the HIV status of your partner(s).  You take drugs by injection.  You are sexually active with a partner who has HIV. Talk with your health care provider about whether you are at high risk of being infected with HIV. If you choose to begin PrEP, you should first be tested for HIV. You should then be tested every 3 months for as long as you are taking PrEP.  PREGNANCY   If you are premenopausal and you may become pregnant, ask your health care provider about preconception counseling.  If you may become pregnant, take 400 to 800 micrograms (mcg) of folic acid every day.  If you want to prevent pregnancy, talk to your health care provider about birth control (contraception). OSTEOPOROSIS AND MENOPAUSE   Osteoporosis is a disease in which the bones lose minerals and strength with aging. This can result in serious bone fractures. Your risk for osteoporosis can be identified using a bone density scan.  If you are 86 years of age or older, or if you are at risk for osteoporosis and fractures, ask your health care provider if you should be screened.  Ask your health care provider whether you should take a calcium or vitamin D supplement to lower your risk for osteoporosis.  Menopause  may have certain physical symptoms and risks.  Hormone replacement therapy may reduce some of these symptoms and risks. Talk to your health care provider about whether hormone replacement therapy is right for you.  HOME CARE INSTRUCTIONS   Schedule regular health, dental, and eye exams.  Stay current with your immunizations.   Do not use any tobacco products including cigarettes, chewing tobacco, or electronic cigarettes.  If you are pregnant, do not drink alcohol.  If you are breastfeeding, limit how much and how often you drink alcohol.  Limit alcohol intake to no more than 1 drink per day for nonpregnant women. One drink equals 12 ounces of beer, 5 ounces of wine, or 1 ounces of hard liquor.  Do not use street drugs.  Do not share needles.  Ask your health care provider for help if you need support or information about quitting drugs.  Tell your health care provider if you often feel depressed.  Tell your health care provider if you have ever been abused or do not feel safe at home.   This information is not intended to replace advice given to you by your health care provider. Make sure you discuss any questions you have with your health care provider.   Document Released: 12/28/2010 Document Revised: 07/05/2014 Document Reviewed: 05/16/2013 Elsevier Interactive Patient Education [employer].

## 2015-07-01 NOTE — Assessment & Plan Note (Signed)
Stressed the importance of weight loss Discussed exercise, diet changes, decreased portions at length

## 2015-07-01 NOTE — Assessment & Plan Note (Signed)
She would prefer not to be on a statin Discussed risks of having elevated cholesterol Stressed increasing exercise, decreasing portions and losing weight Recheck lipids

## 2015-07-01 NOTE — Assessment & Plan Note (Signed)
Blood pressure elevated here today-? Controlled She will start monitoring her blood pressure daily and update me via my chart Stressed the importance of making sure her blood pressure is well controlled Low-sodium diet Start regular exercise Weight loss We will adjust medication if needed

## 2015-07-01 NOTE — Assessment & Plan Note (Signed)
Not currently controlled Reviewed GERD diet Stressed weight loss Discussed stress management We will increase pantoprazole to 40 mg twice daily-discussed that this should be short-term only Can take Zantac if needed

## 2015-07-02 ENCOUNTER — Other Ambulatory Visit (INDEPENDENT_AMBULATORY_CARE_PROVIDER_SITE_OTHER): Payer: BLUE CROSS/BLUE SHIELD

## 2015-07-02 DIAGNOSIS — E669 Obesity, unspecified: Secondary | ICD-10-CM | POA: Diagnosis not present

## 2015-07-02 DIAGNOSIS — Z0000 Encounter for general adult medical examination without abnormal findings: Secondary | ICD-10-CM

## 2015-07-02 DIAGNOSIS — I10 Essential (primary) hypertension: Secondary | ICD-10-CM | POA: Diagnosis not present

## 2015-07-02 DIAGNOSIS — E785 Hyperlipidemia, unspecified: Secondary | ICD-10-CM | POA: Diagnosis not present

## 2015-07-02 DIAGNOSIS — K219 Gastro-esophageal reflux disease without esophagitis: Secondary | ICD-10-CM

## 2015-07-02 LAB — COMPREHENSIVE METABOLIC PANEL
ALT: 28 U/L (ref 0–35)
AST: 20 U/L (ref 0–37)
Albumin: 4.1 g/dL (ref 3.5–5.2)
Alkaline Phosphatase: 90 U/L (ref 39–117)
BUN: 12 mg/dL (ref 6–23)
CHLORIDE: 105 meq/L (ref 96–112)
CO2: 28 meq/L (ref 19–32)
CREATININE: 1.03 mg/dL (ref 0.40–1.20)
Calcium: 9.9 mg/dL (ref 8.4–10.5)
GFR: 70.19 mL/min (ref >60.00)
Glucose, Bld: 102 mg/dL — ABNORMAL HIGH (ref 70–99)
POTASSIUM: 4.4 meq/L (ref 3.5–5.1)
SODIUM: 143 meq/L (ref 135–145)
Total Bilirubin: 0.6 mg/dL (ref 0.2–1.2)
Total Protein: 6.8 g/dL (ref 6.0–8.3)

## 2015-07-02 LAB — HEMOGLOBIN A1C: Hgb A1c MFr Bld: 5.8 % (ref 4.6–6.5)

## 2015-07-02 LAB — CBC WITH DIFFERENTIAL/PLATELET
BASOS PCT: 0.5 % (ref 0.0–3.0)
Basophils Absolute: 0 10*3/uL (ref 0.0–0.1)
EOS ABS: 0.2 10*3/uL (ref 0.0–0.7)
Eosinophils Relative: 2.4 % (ref 0.0–5.0)
HCT: 38.5 % (ref 36.0–46.0)
Hemoglobin: 12.8 g/dL (ref 12.0–15.0)
LYMPHS ABS: 3.6 10*3/uL (ref 0.7–4.0)
Lymphocytes Relative: 53.2 % — ABNORMAL HIGH (ref 12.0–46.0)
MCHC: 33.3 g/dL (ref 30.0–36.0)
MCV: 91.2 fl (ref 78.0–100.0)
MONO ABS: 0.4 10*3/uL (ref 0.1–1.0)
Monocytes Relative: 6.2 % (ref 3.0–12.0)
NEUTROS ABS: 2.5 10*3/uL (ref 1.4–7.7)
Neutrophils Relative %: 37.7 % — ABNORMAL LOW (ref 43.0–77.0)
PLATELETS: 221 10*3/uL (ref 150.0–400.0)
RBC: 4.22 Mil/uL (ref 3.87–5.11)
RDW: 14.5 % (ref 11.5–15.5)
WBC: 6.7 10*3/uL (ref 4.0–10.5)

## 2015-07-02 LAB — LIPID PANEL
Cholesterol: 250 mg/dL — ABNORMAL HIGH (ref 0–200)
HDL: 54.6 mg/dL (ref >39.00)
LDL CALC: 170 mg/dL — AB (ref 0–99)
NONHDL: 195.71
Total CHOL/HDL Ratio: 5
Triglycerides: 128 mg/dL (ref 0.0–149.0)
VLDL: 25.6 mg/dL (ref 0.0–40.0)

## 2015-07-02 LAB — TSH: TSH: 2.4 u[IU]/mL (ref 0.35–4.50)

## 2015-07-04 ENCOUNTER — Telehealth: Payer: Self-pay | Admitting: Internal Medicine

## 2015-07-04 ENCOUNTER — Encounter: Payer: Self-pay | Admitting: Internal Medicine

## 2015-07-04 NOTE — Telephone Encounter (Signed)
Released with my advise/comments via mychart

## 2015-07-04 NOTE — Telephone Encounter (Signed)
Pt request lab result that was done on 07/02/15. Please call her back

## 2015-07-04 NOTE — Telephone Encounter (Signed)
Please advise. Doesn't look like the results are released on MyChart.

## 2015-07-07 NOTE — Telephone Encounter (Signed)
Spoke with pt. She was able to see results.

## 2015-07-15 ENCOUNTER — Encounter: Payer: Self-pay | Admitting: Internal Medicine

## 2015-09-09 ENCOUNTER — Ambulatory Visit: Payer: Self-pay | Admitting: Gynecology

## 2015-09-09 ENCOUNTER — Encounter: Payer: Self-pay | Admitting: Women's Health

## 2015-09-09 ENCOUNTER — Ambulatory Visit (INDEPENDENT_AMBULATORY_CARE_PROVIDER_SITE_OTHER): Payer: BLUE CROSS/BLUE SHIELD | Admitting: Women's Health

## 2015-09-09 VITALS — BP 136/80 | Ht 63.0 in | Wt 217.4 lb

## 2015-09-09 DIAGNOSIS — Z01419 Encounter for gynecological examination (general) (routine) without abnormal findings: Secondary | ICD-10-CM | POA: Diagnosis not present

## 2015-09-09 NOTE — Patient Instructions (Signed)
Basic Carbohydrate Counting for Diabetes Mellitus Carbohydrate counting is a method for keeping track of the amount of carbohydrates you eat. Eating carbohydrates naturally increases the level of sugar (glucose) in your blood, so it is important for you to know the amount that is okay for you to have in every meal. Carbohydrate counting helps keep the level of glucose in your blood within normal limits. The amount of carbohydrates allowed is different for every person. A dietitian can help you calculate the amount that is right for you. Once you know the amount of carbohydrates you can have, you can count the carbohydrates in the foods you want to eat. Carbohydrates are found in the following foods:  Grains, such as breads and cereals.  Dried beans and soy products.  Starchy vegetables, such as potatoes, peas, and corn.  Fruit and fruit juices.  Milk and yogurt.  Sweets and snack foods, such as cake, cookies, candy, chips, soft drinks, and fruit drinks. CARBOHYDRATE COUNTING There are two ways to count the carbohydrates in your food. You can use either of the methods or a combination of both. Reading the "Nutrition Facts" on [place] The "Nutrition Facts" is an area that is included on the labels of almost all packaged food and beverages in the [place]. It includes the serving size of that food or beverage and information about the nutrients in each serving of the food, including the grams (g) of carbohydrate per serving.  Decide the number of servings of this food or beverage that you will be able to eat or drink. Multiply that number of servings by the number of grams of carbohydrate that is listed on the label for that serving. The total will be the amount of carbohydrates you will be having when you eat or drink this food or beverage. Learning Standard Serving Sizes of Food When you eat food that is not packaged or does not include "Nutrition Facts" on the label, you need to  measure the servings in order to count the amount of carbohydrates.A serving of most carbohydrate-rich foods contains about 15 g of carbohydrates. The following list includes serving sizes of carbohydrate-rich foods that provide 15 g ofcarbohydrate per serving:   1 slice of bread (1 oz) or 1 six-inch tortilla.    of a hamburger bun or English muffin.  4-6 crackers.   cup unsweetened dry cereal.    cup hot cereal.   cup rice or pasta.    cup mashed potatoes or  of a large baked potato.  1 cup fresh fruit or one small piece of fruit.    cup canned or frozen fruit or fruit juice.  1 cup milk.   cup plain fat-free yogurt or yogurt sweetened with artificial sweeteners.   cup cooked dried beans or starchy vegetable, such as peas, corn, or potatoes.  Decide the number of standard-size servings that you will eat. Multiply that number of servings by 15 (the grams of carbohydrates in that serving). For example, if you eat 2 cups of strawberries, you will have eaten 2 servings and 30 g of carbohydrates (2 servings x 15 g = 30 g). For foods such as soups and casseroles, in which more than one food is mixed in, you will need to count the carbohydrates in each food that is included. EXAMPLE OF CARBOHYDRATE COUNTING Sample Dinner  3 oz chicken breast.   cup of brown rice.   cup of corn.  1 cup milk.   1 cup strawberries with  sugar-free whipped topping.  Carbohydrate Calculation Step 1: Identify the foods that contain carbohydrates:   Rice.   Corn.   Milk.   Strawberries. Step 2:Calculate the number of servings eaten of each:   2 servings of rice.   1 serving of corn.   1 serving of milk.   1 serving of strawberries. Step 3: Multiply each of those number of servings by 15 g:   2 servings of rice x 15 g = 30 g.   1 serving of corn x 15 g = 15 g.   1 serving of milk x 15 g = 15 g.   1 serving of strawberries x 15 g = 15 g. Step 4: Add  together all of the amounts to find the total grams of carbohydrates eaten: 30 g + 15 g + 15 g + 15 g = 75 g.   This information is not intended to replace advice given to you by your health care provider. Make sure you discuss any questions you have with your health care provider.   Document Released: 06/14/2005 Document Revised: 07/05/2014 Document Reviewed: 05/11/2013 Elsevier Interactive Patient Education [employer]. Menopause is a normal process in which your reproductive ability comes to an end. This process happens gradually over a span of months to years, usually between the ages of 64 and 37. Menopause is complete when you have missed 12 consecutive menstrual periods. It is important to talk with your health care provider about some of the most common conditions that affect postmenopausal women, such as heart disease, cancer, and bone loss (osteoporosis). Adopting a healthy lifestyle and getting preventive care can help to promote your health and wellness. Those actions can also lower your chances of developing some of these common conditions. WHAT SHOULD I KNOW ABOUT MENOPAUSE? During menopause, you may experience a number of symptoms, such as:  Moderate-to-severe hot flashes.  Night sweats.  Decrease in sex drive.  Mood swings.  Headaches.  Tiredness.  Irritability.  Memory problems.  Insomnia. Choosing to treat or not to treat menopausal changes is an individual decision that you make with your health care provider. WHAT SHOULD I KNOW ABOUT HORMONE REPLACEMENT THERAPY AND SUPPLEMENTS? Hormone therapy products are effective for treating symptoms that are associated with menopause, such as hot flashes and night sweats. Hormone replacement carries certain risks, especially as you become older. If you are thinking about using estrogen or estrogen with progestin treatments, discuss the benefits and risks with your health care provider. WHAT SHOULD I KNOW ABOUT HEART  DISEASE AND STROKE? Heart disease, heart attack, and stroke become more likely as you age. This may be due, in part, to the hormonal changes that your body experiences during menopause. These can affect how your body processes dietary fats, triglycerides, and cholesterol. Heart attack and stroke are both medical emergencies. There are many things that you can do to help prevent heart disease and stroke:  Have your blood pressure checked at least every 1-2 years. High blood pressure causes heart disease and increases the risk of stroke.  If you are 18-23 years old, ask your health care provider if you should take aspirin to prevent a heart attack or a stroke.  Do not use any tobacco products, including cigarettes, chewing tobacco, or electronic cigarettes. If you need help quitting, ask your health care provider.  It is important to eat a healthy diet and maintain a healthy weight.  Be sure to include plenty of vegetables, fruits, low-fat dairy products,  and lean protein.  Avoid eating foods that are high in solid fats, added sugars, or salt (sodium).  Get regular exercise. This is one of the most important things that you can do for your health.  Try to exercise for at least 150 minutes each week. The type of exercise that you do should increase your heart rate and make you sweat. This is known as moderate-intensity exercise.  Try to do strengthening exercises at least twice each week. Do these in addition to the moderate-intensity exercise.  Know your numbers.Ask your health care provider to check your cholesterol and your blood glucose. Continue to have your blood tested as directed by your health care provider. WHAT SHOULD I KNOW ABOUT CANCER SCREENING? There are several types of cancer. Take the following steps to reduce your risk and to catch any cancer development as early as possible. Breast Cancer  Practice breast self-awareness.  This means understanding how your breasts  normally appear and feel.  It also means doing regular breast self-exams. Let your health care provider know about any changes, no matter how small.  If you are 6 or older, have a clinician do a breast exam (clinical breast exam or CBE) every year. Depending on your age, family history, and medical history, it may be recommended that you also have a yearly breast X-ray (mammogram).  If you have a family history of breast cancer, talk with your health care provider about genetic screening.  If you are at high risk for breast cancer, talk with your health care provider about having an MRI and a mammogram every year.  Breast cancer (BRCA) gene test is recommended for women who have family members with BRCA-related cancers. Results of the assessment will determine the need for genetic counseling and BRCA1 and for BRCA2 testing. BRCA-related cancers include these types:  Breast. This occurs in males or females.  Ovarian.  Tubal. This may also be called fallopian tube cancer.  Cancer of the abdominal or pelvic lining (peritoneal cancer).  Prostate.  Pancreatic. Cervical, Uterine, and Ovarian Cancer Your health care provider may recommend that you be screened regularly for cancer of the pelvic organs. These include your ovaries, uterus, and vagina. This screening involves a pelvic exam, which includes checking for microscopic changes to the surface of your cervix (Pap test).  For women ages 21-65, health care providers may recommend a pelvic exam and a Pap test every three years. For women ages 23-65, they may recommend the Pap test and pelvic exam, combined with testing for human papilloma virus (HPV), every five years. Some types of HPV increase your risk of cervical cancer. Testing for HPV may also be done on women of any age who have unclear Pap test results.  Other health care providers may not recommend any screening for nonpregnant women who are considered low risk for pelvic cancer and  have no symptoms. Ask your health care provider if a screening pelvic exam is right for you.  If you have had past treatment for cervical cancer or a condition that could lead to cancer, you need Pap tests and screening for cancer for at least 20 years after your treatment. If Pap tests have been discontinued for you, your risk factors (such as having a new sexual partner) need to be reassessed to determine if you should start having screenings again. Some women have medical problems that increase the chance of getting cervical cancer. In these cases, your health care provider may recommend that you have screening  and Pap tests more often.  If you have a family history of uterine cancer or ovarian cancer, talk with your health care provider about genetic screening.  If you have vaginal bleeding after reaching menopause, tell your health care provider.  There are currently no reliable tests available to screen for ovarian cancer. Lung Cancer Lung cancer screening is recommended for adults 16-68 years old who are at high risk for lung cancer because of a history of smoking. A yearly low-dose CT scan of the lungs is recommended if you:  Currently smoke.  Have a history of at least 30 pack-years of smoking and you currently smoke or have quit within the past 15 years. A pack-year is smoking an average of one pack of cigarettes per day for one year. Yearly screening should:  Continue until it has been 15 years since you quit.  Stop if you develop a health problem that would prevent you from having lung cancer treatment. Colorectal Cancer  This type of cancer can be detected and can often be prevented.  Routine colorectal cancer screening usually begins at age 39 and continues through age 67.  If you have risk factors for colon cancer, your health care provider may recommend that you be screened at an earlier age.  If you have a family history of colorectal cancer, talk with your health care  provider about genetic screening.  Your health care provider may also recommend using home test kits to check for hidden blood in your stool.  A small camera at the end of a tube can be used to examine your colon directly (sigmoidoscopy or colonoscopy). This is done to check for the earliest forms of colorectal cancer.  Direct examination of the colon should be repeated every 5-10 years until age 25. However, if early forms of precancerous polyps or small growths are found or if you have a family history or genetic risk for colorectal cancer, you may need to be screened more often. Skin Cancer  Check your skin from head to toe regularly.  Monitor any moles. Be sure to tell your health care provider:  About any new moles or changes in moles, especially if there is a change in a mole's shape or color.  If you have a mole that is larger than the size of a pencil eraser.  If any of your family members has a history of skin cancer, especially at a young age, talk with your health care provider about genetic screening.  Always use sunscreen. Apply sunscreen liberally and repeatedly throughout the day.  Whenever you are outside, protect yourself by wearing long sleeves, pants, a wide-brimmed hat, and sunglasses. WHAT SHOULD I KNOW ABOUT OSTEOPOROSIS? Osteoporosis is a condition in which bone destruction happens more quickly than new bone creation. After menopause, you may be at an increased risk for osteoporosis. To help prevent osteoporosis or the bone fractures that can happen because of osteoporosis, the following is recommended:  If you are 82-28 years old, get at least 1,000 mg of calcium and at least 600 mg of vitamin D per day.  If you are older than age 4 but younger than age 64, get at least 1,200 mg of calcium and at least 600 mg of vitamin D per day.  If you are older than age 73, get at least 1,200 mg of calcium and at least 800 mg of vitamin D per day. Smoking and excessive  alcohol intake increase the risk of osteoporosis. Eat foods that are rich  in calcium and vitamin D, and do weight-bearing exercises several times each week as directed by your health care provider. WHAT SHOULD I KNOW ABOUT HOW MENOPAUSE AFFECTS [place]? Depression may occur at any age, but it is more common as you become older. Common symptoms of depression include:  Low or sad mood.  Changes in sleep patterns.  Changes in appetite or eating patterns.  Feeling an overall lack of motivation or enjoyment of activities that you previously enjoyed.  Frequent crying spells. Talk with your health care provider if you think that you are experiencing depression. WHAT SHOULD I KNOW ABOUT IMMUNIZATIONS? It is important that you get and maintain your immunizations. These include:  Tetanus, diphtheria, and pertussis (Tdap) booster vaccine.  Influenza every year before the flu season begins.  Pneumonia vaccine.  Shingles vaccine. Your health care provider may also recommend other immunizations.   This information is not intended to replace advice given to you by your health care provider. Make sure you discuss any questions you have with your health care provider.   Document Released: 08/06/2005 Document Revised: 07/05/2014 Document Reviewed: 02/14/2014 Elsevier Interactive Patient Education [employer].

## 2015-09-09 NOTE — Progress Notes (Signed)
Tami Hughes January 18, 1956 [id number]    History:    Presents for annual exam.  History of TAH with sling for prolapse doing well. No HRT. Widow, not sexually active. Primary care manages labs and hypertension. Last hemoglobin A1c was 5.8 in January. Normal Pap and mammogram history, mammogram normal after ultrasound in November 2016. 2013 DEXA hip T score +2. Cholecystectomy July 2016. 2013 negative colonoscopy.  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 Husband died from complications of ype 1 diabetes at age 92.  ROS:  A ROS was performed and pertinent positives and negatives are included.  Exam:  Filed Vitals:   09/09/15 1507  BP: 136/80    General appearance:  Normal Thyroid:  Symmetrical, normal in size, without palpable masses or nodularity. Respiratory  Auscultation:  Clear without wheezing or rhonchi Cardiovascular  Auscultation:  Regular rate, without rubs, murmurs or gallops  Edema/varicosities:  Not grossly evident Abdominal  Soft,nontender, without masses, guarding or rebound.  Liver/spleen:  No organomegaly noted  Hernia:  None appreciated  Skin  Inspection:  Grossly normal   Breasts: Examined lying and sitting.     Right: Without masses, retractions, discharge or axillary adenopathy.     Left: Without masses, retractions, discharge or axillary adenopathy. Gentitourinary   Inguinal/mons:  Normal without inguinal adenopathy  External genitalia:  Normal  BUS/Urethra/Skene's glands:  Normal  Vagina:  Normal  Cervix:  And uterus absent  Adnexa/parametria:     Rt: Without masses or tenderness.   Lt: Without masses or tenderness.  Anus and perineum: Normal  Digital rectal exam: Normal sphincter tone without palpated masses or tenderness  Assessment/Plan:  60 y.o. WBF G2P2 for annual exam with no complaints.  TAH with sling on no  HRT Hypertension-primary care manages labs and meds Obesity  Plan: SBE's, continue 3-D annual screening mammogram, calcium rich diet, vitamin D 2000 daily encouraged. Home safety, fall prevention and importance of weightbearing exercise reviewed. Reviewed hemoglobin A1c in the prediabetic range, important to decrease simple carbs in diet and increase regular exercise for weight loss. Zostavax recommended, will discuss Pneumovax with primary care.    Huel Cote [facility], 4:04 PM 09/09/2015

## 2015-11-10 DIAGNOSIS — H35033 Hypertensive retinopathy, bilateral: Secondary | ICD-10-CM | POA: Diagnosis not present

## 2016-02-03 ENCOUNTER — Other Ambulatory Visit: Payer: Self-pay | Admitting: Internal Medicine

## 2016-02-03 NOTE — Telephone Encounter (Signed)
rx request 

## 2016-04-12 ENCOUNTER — Other Ambulatory Visit: Payer: Self-pay | Admitting: Gynecology

## 2016-04-12 DIAGNOSIS — Z1231 Encounter for screening mammogram for malignant neoplasm of breast: Secondary | ICD-10-CM

## 2016-05-13 ENCOUNTER — Ambulatory Visit
Admission: RE | Admit: 2016-05-13 | Discharge: 2016-05-13 | Disposition: A | Discharge status: Home / Self Care | Payer: BLUE CROSS/BLUE SHIELD | Source: Ambulatory Visit | Attending: Gynecology | Admitting: Gynecology

## 2016-05-13 DIAGNOSIS — Z1231 Encounter for screening mammogram for malignant neoplasm of breast: Secondary | ICD-10-CM

## 2016-07-31 ENCOUNTER — Encounter: Payer: Self-pay | Admitting: Student

## 2016-09-07 ENCOUNTER — Ambulatory Visit (INDEPENDENT_AMBULATORY_CARE_PROVIDER_SITE_OTHER): Payer: BLUE CROSS/BLUE SHIELD | Admitting: Physician Assistant

## 2016-09-07 VITALS — BP 148/66 | HR 75 | Temp 97.6°F | Resp 16 | Ht 63.0 in | Wt 255.8 lb

## 2016-09-07 DIAGNOSIS — R200 Anesthesia of skin: Secondary | ICD-10-CM | POA: Diagnosis not present

## 2016-09-07 DIAGNOSIS — J3489 Other specified disorders of nose and nasal sinuses: Secondary | ICD-10-CM | POA: Diagnosis not present

## 2016-09-07 DIAGNOSIS — R030 Elevated blood-pressure reading, without diagnosis of hypertension: Secondary | ICD-10-CM

## 2016-09-07 MED ORDER — CETIRIZINE-PSEUDOEPHEDRINE ER 5-120 MG PO TB12
1.0000 | ORAL_TABLET | Freq: Two times a day (BID) | ORAL | 0 refills | Status: DC
Start: 2016-09-07 — End: 2019-02-02

## 2016-09-07 NOTE — Patient Instructions (Addendum)
If you develop any sudden weakness, confusion, slurred speech immediately call 911 and go to the nearest emergency department.     IF you received an x-ray today, you will receive an invoice from [facility]. Please contact [facility] at [phone] with questions or concerns regarding your invoice.   IF you received labwork today, you will receive an invoice from [facility]. Please contact [facility] at [phone] with questions or concerns regarding your invoice.   Our billing staff will not be able to assist you with questions regarding bills from these companies.  You will be contacted with the lab results as soon as they are available. The fastest way to get your results is to activate your My Chart account. Instructions are located on the last page of this paperwork. If you have not heard from [place] regarding the results in 2 weeks, please contact this office.

## 2016-09-07 NOTE — Progress Notes (Signed)
09/07/2016 3:31 PM   DOB: September 14, 1955 / MRN: [id number]  SUBJECTIVE:  Tami Hughes is a 61 y.o. female presenting for "sinus issues" for the last 10 day.  Has been taking mucinex and feels that she is not improving appropriately. She denies fever, chill, nausea, appetite changes.   She is complaining of "tightness" around the left eye along with some mild pain that started last night and lasted one hour.  The pain has come back today and also resolved.   Associates some tightness about the left lip as well. Did get some relief with a hot towel and tylenol.  She has had similar symptoms in the past and this was found to be sinus related.   She is concerned that she may be having a stroke, however her she denies vision changes, slurring, confusion, focal weakness. Her mother has a history of stroke.   She has a PMH of HTN.    She has No Known Allergies.   She  has a past medical history of Dyslipidemia; Hypertension; Polyp of colon (2009); and Shingles.    She  reports that she has never smoked. She has never used smokeless tobacco. She reports that she drinks about 1.2 oz of alcohol per week . She reports that she does not use drugs. She  reports that she currently engages in sexual activity and has had female partners. The patient  has a past surgical history that includes Abdominal hysterectomy; Labioplasty; Vulvectomy; Pubovaginal sling (05/08/2010); Colonoscopy; and Cholecystectomy (N/A, 12/18/2014).  Her family history includes Breast cancer in her mother; Kidney disease in her mother.  Review of Systems  Eyes: Negative for blurred vision and double vision.  Gastrointestinal: Negative for nausea.  Neurological: Negative for dizziness, sensory change, focal weakness, loss of consciousness and headaches.    The problem list and medications were reviewed and updated by myself where necessary and exist elsewhere in the encounter.   OBJECTIVE:  BP (!) 148/66 (BP Location: Left Arm, Patient  Position: Sitting, Cuff Size: Large)   Pulse 75   Temp 97.6 F (36.4 C) (Oral)   Resp 16   Ht 5\' 3"  (1.6 m)   Wt 255 lb 12.8 oz (116 kg)   SpO2 99%   BMI 45.31 kg/m   Physical Exam  Eyes: Pupils are equal, round, and reactive to light. Right pupil is round and reactive. Left pupil is round and reactive. Pupils are equal.  Pulmonary/Chest: Effort normal and breath sounds normal.  Musculoskeletal: Normal range of motion.  Neurological: She is alert. She has normal reflexes. She displays no atrophy, no tremor and normal reflexes. No cranial nerve deficit or sensory deficit. She exhibits normal muscle tone. She displays no seizure activity. Coordination and gait normal. GCS eye subscore is 4. GCS verbal subscore is 5. GCS motor subscore is 6.  Grip equal bilaterally. Negative for facial drop.  Trigeminal nerve intact to soft touch bilaterally.   Skin: Skin is warm and dry.    No results found for this or any previous visit (from the past 72 hour(s)).  No results found.  Lab Results  Component Value Date   CHOL 250 (H) 07/02/2015   HDL 54.60 07/02/2015   LDLCALC 170 (H) 07/02/2015   LDLDIRECT 179.1 05/02/2012   TRIG 128.0 07/02/2015   CHOLHDL 5 07/02/2015   Lab Results  Component Value Date   WBC 6.7 07/02/2015   HGB 12.8 07/02/2015   HCT 38.5 07/02/2015   MCV 91.2 07/02/2015   PLT  221.0 07/02/2015   Lab Results  Component Value Date   CREATININE 1.03 07/02/2015   BUN 12 07/02/2015   NA 143 07/02/2015   K 4.4 07/02/2015   CL 105 07/02/2015   CO2 28 07/02/2015   Lab Results  Component Value Date   HGBA1C 5.8 07/02/2015   BP Readings from Last 3 Encounters:  09/07/16 (!) 148/66  09/09/15 136/80  07/01/15 (!) 152/74    ASSESSMENT AND PLAN:  Tami Hughes was seen today for possible stroke.  Diagnoses and all orders for this visit:  Facial numbness Comments: Her nuerological exam is completeley normal and trigeminal nerve is intact to light touch bilaterally.  I see  no evidence of CVA whatsoever.  Orders: -     EKG 12-Lead  Elevated blood pressure reading Comments: Normalized here today with resting.   Sinus pressure Comments: Likely allergic in nature.  Advised she try Zyrtec-D as she has had pseudoephedrine in the past and this helped.  Orders: -     cetirizine-pseudoephedrine (ZYRTEC-D) 5-120 MG tablet; Take 1 tablet by mouth 2 (two) times daily.    The patient is advised to call or return to clinic if she does not see an improvement in symptoms, or to seek the care of the closest emergency department if she worsens with the above plan.   Philis Fendt, MHS, PA-C [facility] 09/07/2016 3:31 PM

## 2016-09-17 ENCOUNTER — Telehealth: Payer: Self-pay | Admitting: Family Medicine

## 2016-09-17 NOTE — Telephone Encounter (Signed)
Pt was seen on 09/07/16 and Dr. Carlota Raspberry prescribed her zyrtec-D and pt said its not working and needs something else called in.. Please advise: [phone]

## 2016-09-17 NOTE — Telephone Encounter (Signed)
Any alternative?

## 2016-09-18 NOTE — Telephone Encounter (Signed)
Patient seen by Philis Fendt. Will route to him.

## 2016-09-20 ENCOUNTER — Encounter: Payer: Self-pay | Admitting: Internal Medicine

## 2016-09-20 ENCOUNTER — Encounter: Payer: Self-pay | Admitting: Physician Assistant

## 2016-09-20 ENCOUNTER — Ambulatory Visit (INDEPENDENT_AMBULATORY_CARE_PROVIDER_SITE_OTHER): Payer: BLUE CROSS/BLUE SHIELD | Admitting: Physician Assistant

## 2016-09-20 ENCOUNTER — Other Ambulatory Visit: Payer: Self-pay | Admitting: Internal Medicine

## 2016-09-20 VITALS — BP 142/80 | HR 86 | Temp 98.6°F | Resp 16 | Ht 63.0 in | Wt 222.8 lb

## 2016-09-20 DIAGNOSIS — J069 Acute upper respiratory infection, unspecified: Secondary | ICD-10-CM

## 2016-09-20 DIAGNOSIS — Z9109 Other allergy status, other than to drugs and biological substances: Secondary | ICD-10-CM

## 2016-09-20 DIAGNOSIS — R05 Cough: Secondary | ICD-10-CM

## 2016-09-20 DIAGNOSIS — R059 Cough, unspecified: Secondary | ICD-10-CM

## 2016-09-20 MED ORDER — LEVOCETIRIZINE DIHYDROCHLORIDE 5 MG PO TABS: 5.0000 mg | ORAL_TABLET | Freq: Every evening | ORAL | 1 refills | Status: DC

## 2016-09-20 MED ORDER — AZITHROMYCIN 250 MG PO TABS
ORAL_TABLET | ORAL | 0 refills | Status: DC
Start: 2016-09-20 — End: 2016-11-11

## 2016-09-20 MED ORDER — PSEUDOEPHEDRINE HCL ER 240 MG PO TB24: 240.0000 mg | ORAL_TABLET | Freq: Every day | ORAL | 0 refills | Status: DC | PRN

## 2016-09-20 NOTE — Telephone Encounter (Signed)
Pt calling about her refill on a new medicine for allergies he states that she will be on a plane on Thursday AND WOULD LIKE SOMETHING NOW SHE IS NO BETTER AND STATES THAT KNOW ONE HAS GIVEN HER A CALL

## 2016-09-20 NOTE — Progress Notes (Signed)
09/20/2016 9:46 PM   DOB: 10-30-1955 / MRN: [id number]  SUBJECTIVE:  Tami Hughes is a 61 y.o. female presenting for a recheck of sinus pain and pressure.  She has eye itching, burning and sneezing. Tells me her symptoms are better in her house.  She now has cough.  Tried Zyrtec-D with some relief.  She tried a netty pot and this also helped some. She is now coughing. She denies a history of diabetes. She denies a history of asthma.   She has No Known Allergies.   She  has a past medical history of Dyslipidemia; Hypertension; Polyp of colon (2009); and Shingles.    She  reports that she has never smoked. She has never used smokeless tobacco. She reports that she drinks about 1.2 oz of alcohol per week . She reports that she does not use drugs. She  reports that she currently engages in sexual activity and has had female partners. The patient  has a past surgical history that includes Abdominal hysterectomy; Labioplasty; Vulvectomy; Pubovaginal sling (05/08/2010); Colonoscopy; and Cholecystectomy (N/A, 12/18/2014).  Her family history includes Breast cancer in her mother; Kidney disease in her mother.  Review of Systems  HENT: Positive for congestion and sinus pain. Negative for ear discharge, ear pain, hearing loss, sore throat and tinnitus.   Eyes: Negative.   Respiratory: Positive for cough.   Gastrointestinal: Negative for nausea.  Neurological: Negative for dizziness and headaches.    The problem list and medications were reviewed and updated by myself where necessary and exist elsewhere in the encounter.   OBJECTIVE:  BP (!) 142/80 (BP Location: Right Arm, Patient Position: Sitting, Cuff Size: Small)   Pulse 86   Temp 98.6 F (37 C) (Oral)   Resp 16   Ht 5\' 3"  (1.6 m)   Wt 222 lb 12.8 oz (101.1 kg)   SpO2 97%   BMI 39.47 kg/m   Physical Exam  Constitutional: She is oriented to person, place, and time. She appears well-nourished. No distress.  HENT:  Right Ear: Tympanic  membrane normal.  Left Ear: Tympanic membrane normal.  Nose: Nose normal.  Mouth/Throat: Uvula is midline, oropharynx is clear and moist and mucous membranes are normal.  Eyes: EOM are normal. Pupils are equal, round, and reactive to light.  Cardiovascular: Normal rate, regular rhythm and normal heart sounds.   Pulmonary/Chest: Effort normal. She has no wheezes. She has no rales.  Abdominal: She exhibits no distension.  Musculoskeletal: Normal range of motion.  Lymphadenopathy:       Head (right side): No submandibular and no tonsillar adenopathy present.       Head (left side): No submandibular and no tonsillar adenopathy present.    She has no cervical adenopathy.  Neurological: She is alert and oriented to person, place, and time. No cranial nerve deficit. Gait normal.  Skin: Skin is dry. She is not diaphoretic.  Psychiatric: She has a normal mood and affect.  Vitals reviewed.   Lab Results  Component Value Date   HGBA1C 5.8 07/02/2015   Wt Readings from Last 3 Encounters:  09/20/16 222 lb 12.8 oz (101.1 kg)  09/07/16 255 lb 12.8 oz (116 kg)  09/09/15 217 lb 6.4 oz (98.6 kg)     No results found for this or any previous visit (from the past 28 hour(s)).  No results found.  ASSESSMENT AND PLAN:  Maudene was seen today for uri.  Diagnoses and all orders for this visit:  Protracted URI: This has  been present now for greater than three weeks.  She would like to try an antibiotic.  I don't think this is unreasonable given chonicity however I think her symptoms are most likely allergic in nature.  She does not want to try steroids. Will try z-pack and she will try xyzal along with pseudoephedrine xr if she does not get relief with abx.  -     azithromycin (ZITHROMAX) 250 MG tablet; Take two on day one and one daily thereafter.  Cough  Environmental allergies Comments: She may need a steroid for control.  Will try pred 50 mg qd for three days if she calls Orders: -      levocetirizine (XYZAL) 5 MG tablet; Take 1 tablet (5 mg total) by mouth every evening. -     Pseudoephedrine HCl 240 MG TB24; Take 1 tablet (240 mg total) by mouth daily as needed.    The patient is advised to call or return to clinic if she does not see an improvement in symptoms, or to seek the care of the closest emergency department if she worsens with the above plan.   Philis Fendt, MHS, PA-C [facility] 09/20/2016 9:46 PM

## 2016-09-20 NOTE — Patient Instructions (Signed)
     IF you received an x-ray today, you will receive an invoice from [facility]. Please contact [facility] at [phone] with questions or concerns regarding your invoice.   IF you received labwork today, you will receive an invoice from [facility]. Please contact [facility] at [phone] with questions or concerns regarding your invoice.   Our billing staff will not be able to assist you with questions regarding bills from these companies.  You will be contacted with the lab results as soon as they are available. The fastest way to get your results is to activate your My Chart account. Instructions are located on the last page of this paperwork. If you have not heard from us regarding the results in 2 weeks, please contact this office.     

## 2016-10-13 ENCOUNTER — Encounter: Payer: Self-pay | Admitting: Internal Medicine

## 2016-11-10 ENCOUNTER — Encounter: Payer: Self-pay | Admitting: Gynecology

## 2016-11-11 ENCOUNTER — Encounter: Payer: Self-pay | Admitting: Internal Medicine

## 2016-11-11 ENCOUNTER — Other Ambulatory Visit (INDEPENDENT_AMBULATORY_CARE_PROVIDER_SITE_OTHER): Payer: BLUE CROSS/BLUE SHIELD

## 2016-11-11 ENCOUNTER — Ambulatory Visit (INDEPENDENT_AMBULATORY_CARE_PROVIDER_SITE_OTHER): Payer: BLUE CROSS/BLUE SHIELD | Admitting: Internal Medicine

## 2016-11-11 VITALS — BP 132/84 | HR 70 | Temp 98.7°F | Resp 16 | Ht 63.0 in | Wt 223.0 lb

## 2016-11-11 DIAGNOSIS — K219 Gastro-esophageal reflux disease without esophagitis: Secondary | ICD-10-CM

## 2016-11-11 DIAGNOSIS — N1831 Chronic kidney disease, stage 3a: Secondary | ICD-10-CM | POA: Insufficient documentation

## 2016-11-11 DIAGNOSIS — Z0000 Encounter for general adult medical examination without abnormal findings: Secondary | ICD-10-CM

## 2016-11-11 DIAGNOSIS — E7800 Pure hypercholesterolemia, unspecified: Secondary | ICD-10-CM

## 2016-11-11 DIAGNOSIS — R7303 Prediabetes: Secondary | ICD-10-CM | POA: Insufficient documentation

## 2016-11-11 DIAGNOSIS — I10 Essential (primary) hypertension: Secondary | ICD-10-CM

## 2016-11-11 DIAGNOSIS — E119 Type 2 diabetes mellitus without complications: Secondary | ICD-10-CM | POA: Insufficient documentation

## 2016-11-11 DIAGNOSIS — E669 Obesity, unspecified: Secondary | ICD-10-CM | POA: Diagnosis not present

## 2016-11-11 LAB — CBC WITH DIFFERENTIAL/PLATELET
BASOS PCT: 0.7 % (ref 0.0–3.0)
Basophils Absolute: 0 10*3/uL (ref 0.0–0.1)
EOS ABS: 0.2 10*3/uL (ref 0.0–0.7)
Eosinophils Relative: 2.7 % (ref 0.0–5.0)
HEMATOCRIT: 40.1 % (ref 36.0–46.0)
Hemoglobin: 13.4 g/dL (ref 12.0–15.0)
LYMPHS ABS: 3.1 10*3/uL (ref 0.7–4.0)
LYMPHS PCT: 48.4 % — AB (ref 12.0–46.0)
MCHC: 33.5 g/dL (ref 30.0–36.0)
MCV: 92 fl (ref 78.0–100.0)
Monocytes Absolute: 0.5 10*3/uL (ref 0.1–1.0)
Monocytes Relative: 7.3 % (ref 3.0–12.0)
NEUTROS ABS: 2.6 10*3/uL (ref 1.4–7.7)
NEUTROS PCT: 40.9 % — AB (ref 43.0–77.0)
PLATELETS: 237 10*3/uL (ref 150.0–400.0)
RBC: 4.36 Mil/uL (ref 3.87–5.11)
RDW: 14 % (ref 11.5–15.5)
WBC: 6.5 10*3/uL (ref 4.0–10.5)

## 2016-11-11 LAB — COMPREHENSIVE METABOLIC PANEL
ALT: 32 U/L (ref 0–35)
AST: 19 U/L (ref 0–37)
Albumin: 4.3 g/dL (ref 3.5–5.2)
Alkaline Phosphatase: 90 U/L (ref 39–117)
BUN: 11 mg/dL (ref 6–23)
CALCIUM: 9.5 mg/dL (ref 8.4–10.5)
CHLORIDE: 105 meq/L (ref 96–112)
CO2: 32 mEq/L (ref 19–32)
Creatinine, Ser: 1.02 mg/dL (ref 0.40–1.20)
GFR: 70.66 mL/min (ref >60.00)
GLUCOSE: 109 mg/dL — AB (ref 70–99)
Potassium: 3.9 mEq/L (ref 3.5–5.1)
Sodium: 141 mEq/L (ref 135–145)
Total Bilirubin: 0.5 mg/dL (ref 0.2–1.2)
Total Protein: 7.1 g/dL (ref 6.0–8.3)

## 2016-11-11 LAB — HEMOGLOBIN A1C: HEMOGLOBIN A1C: 5.9 % (ref 4.6–6.5)

## 2016-11-11 LAB — LIPID PANEL
CHOLESTEROL: 241 mg/dL — AB (ref 0–200)
HDL: 61.8 mg/dL (ref >39.00)
LDL Cholesterol: 158 mg/dL — ABNORMAL HIGH (ref 0–99)
NONHDL: 178.92
TRIGLYCERIDES: 106 mg/dL (ref 0.0–149.0)
Total CHOL/HDL Ratio: 4
VLDL: 21.2 mg/dL (ref 0.0–40.0)

## 2016-11-11 LAB — TSH: TSH: 1.04 u[IU]/mL (ref 0.35–4.50)

## 2016-11-11 MED ORDER — DICLOFENAC SODIUM 1 % TD GEL: 4.0000 g | Freq: Four times a day (QID) | TRANSDERMAL | 8 refills | Status: DC

## 2016-11-11 NOTE — Progress Notes (Addendum)
Subjective:    Patient ID: Tami Hughes, female    DOB: 01/28/1956, 61 y.o.   MRN: [id number]  HPI She is here for a physical exam.   She denies changes in her history.  She thinks she may have developed some allergies.  She has been taking zyrtec.  Does 30 minutes on treadmill.  Back to school for masters in human resources.    Medications and allergies reviewed with patient and updated if appropriate.  Patient Active Problem List   Diagnosis Date Noted  . Prediabetes 11/11/2016  . Status post laparoscopic cholecystectomy June 20156 12/18/2014  . Herpes simplex without mention of complication 04/27/5944  . Menopause syndrome 08/21/2012  . Obesity, Class II, BMI 35-39.9 05/02/2012  . Personal history of colonic polyps 01/27/2012  . Menopausal state 03/22/2011  . VENOUS INSUFFICIENCY, LEGS 08/28/2010  . PARESTHESIA 08/28/2010  . Hyperlipidemia 02/22/2008  . Essential hypertension 02/22/2008  . GERD 02/22/2008    Current Outpatient Prescriptions on File Prior to Visit  Medication Sig Dispense Refill  . CARTIA XT 240 MG 24 hr capsule TAKE ONE CAPSULE BY MOUTH ONCE DAILY 90 capsule 0  . cetirizine-pseudoephedrine (ZYRTEC-D) 5-120 MG tablet Take 1 tablet by mouth 2 (two) times daily. 14 tablet 0  . diclofenac sodium (VOLTAREN) 1 % GEL Apply 4 g topically 4 (four) times daily. 100 g 8  . levocetirizine (XYZAL) 5 MG tablet Take 1 tablet (5 mg total) by mouth every evening. 30 tablet 1  . lisinopril-hydrochlorothiazide (PRINZIDE,ZESTORETIC) 20-12.5 MG tablet TAKE ONE TABLET BY MOUTH ONCE DAILY 90 tablet 0  . pantoprazole (PROTONIX) 40 MG tablet Take 1 tablet (40 mg total) by mouth 2 (two) times daily before a meal. 180 tablet 1  . Pseudoephedrine HCl 240 MG TB24 Take 1 tablet (240 mg total) by mouth daily as needed. 28 each 0  . valACYclovir (VALTREX) 1000 MG tablet Take 1 tablet (1,000 mg total) by mouth 2 (two) times daily. 20 tablet 0  . vitamin E (VITAMIN E) 400 UNIT  capsule Take 400 Units by mouth daily.       No current facility-administered medications on file prior to visit.     Past Medical History:  Diagnosis Date  . Dyslipidemia   . Hypertension   . Polyp of colon 2009   adenoma benign  . Shingles    recurrent: 08/2010, 10/2013    Past Surgical History:  Procedure Laterality Date  . ABDOMINAL HYSTERECTOMY     TAH  . CHOLECYSTECTOMY N/A 12/18/2014   Procedure: LAPAROSCOPIC CHOLECYSTECTOMY WITH INTRAOPERATIVE CHOLANGIOGRAM;  Surgeon: Johnathan Hausen, MD;  Location: [facility];  Service: General;  Laterality: N/A;  . COLONOSCOPY    . LABIOPLASTY    . PUBOVAGINAL SLING  05/08/2010   stress urinary incontinence  . VULVECTOMY     partial vulvectomy secondary to labial hypertrophy..    Social History   Social History  . Marital status: Widowed    Spouse name: N/A  . Number of children: 2  . Years of education: 46   Occupational History  . paralegal [employer]   Social History Main Topics  . Smoking status: Never Smoker  . Smokeless tobacco: Never Used  . Alcohol use 1.2 oz/week    2 Standard drinks or equivalent per week     Comment: occasionally - wine  . Drug use: No  . Sexual activity: Yes    Partners: Male   Other Topics Concern  . None   Social History  Narrative   [employer], [employer] - [employer], [employer] - masters in Energy manager. Married '74 - 27 yrs/widowed. 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 Has high potential for work but will finish master's first. Lives alone.       CPA   Widow, single   2 children, 2 grandchildren   No regular exercise   1 large cup of coffee daily    Family History  Problem Relation Age of Onset  . Kidney disease Mother        had transplant  . Breast cancer Mother   . Colon cancer Neg Hx   . Esophageal cancer Neg Hx   . Rectal cancer Neg Hx   . Stomach cancer Neg Hx   . Diabetes Neg Hx   . Hyperlipidemia Neg Hx   . Heart disease Neg Hx   . COPD Neg  Hx     Review of Systems  Constitutional: Negative for chills and fever.  Eyes: Negative for visual disturbance.  Respiratory: Negative for cough, shortness of breath and wheezing.   Cardiovascular: Positive for leg swelling (mild RLE). Negative for chest pain and palpitations.  Gastrointestinal: Positive for constipation. Negative for abdominal pain, blood in stool, diarrhea and nausea.       Gerd freq  Genitourinary: Negative for dysuria and hematuria.  Musculoskeletal: Negative for arthralgias and back pain.  Skin: Negative for color change and rash.  Neurological: Negative for dizziness, light-headedness and headaches.  Psychiatric/Behavioral: Negative for dysphoric mood. The patient is not nervous/anxious.        Objective:   Vitals:   11/11/16 0749  BP: 132/84  Pulse: 70  Resp: 16  Temp: 98.7 F (37.1 C)   Filed Weights   11/11/16 0749  Weight: 223 lb (101.2 kg)   Body mass index is 39.5 kg/m.  Wt Readings from Last 3 Encounters:  11/11/16 223 lb (101.2 kg)  09/20/16 222 lb 12.8 oz (101.1 kg)  09/07/16 255 lb 12.8 oz (116 kg)     Physical Exam Constitutional: She appears well-developed and well-nourished. No distress.  HENT:  Head: Normocephalic and atraumatic.  Right Ear: External ear normal. Normal ear canal and TM Left Ear: External ear normal.  Normal ear canal and TM Mouth/Throat: Oropharynx is clear and moist.  Eyes: Conjunctivae and EOM are normal.  Neck: Neck supple. No tracheal deviation present. No thyromegaly present.  No carotid bruit  Cardiovascular: Normal rate, regular rhythm and normal heart sounds.   No murmur heard.  No edema. Pulmonary/Chest: Effort normal and breath sounds normal. No respiratory distress. She has no wheezes. She has no rales.  Breast: deferred to Gyn Abdominal: Soft. She exhibits no distension. There is no tenderness.  Lymphadenopathy: She has no cervical adenopathy.  Skin: Skin is warm and dry. She is not  diaphoretic.  Psychiatric: She has a normal mood and affect. Her behavior is normal.         Assessment & Plan:   Physical exam: Screening blood work ordered Immunizations   Up to date; discussed shingrix Colonoscopy  Due this year - 01/2017 Mammogram   Up to date  Gyn  Up to date  -  appointment this summer Dexa  Normal in 2013 - can wait until she is 60 Eye exams    Up to date  EKG      done 08/2016 Exercise - regular exercise Weight - obese - advised and stressed weight loss Skin  - no  concerns Substance abuse  none  See Problem List for Assessment and Plan of chronic medical problems.

## 2016-11-11 NOTE — Assessment & Plan Note (Signed)
Check a1c Low sugar / carb diet Stressed regular exercise, weight loss  

## 2016-11-11 NOTE — Assessment & Plan Note (Signed)
Continue regular exercise Decrease portions Stressed weight loss

## 2016-11-11 NOTE — Patient Instructions (Addendum)
Test(s) ordered today. Your results will be released to [place] (or called to you) after review, usually within 72hours after test completion. If any changes need to be made, you will be notified at that same time.  All other Health Maintenance issues reviewed.   All recommended immunizations and age-appropriate screenings are up-to-date or discussed.  No immunizations administered today.  Consider getting a shingles vaccine (Shingrix),  You should check with your insurance company regarding coverage.  Medications reviewed and updated.  No changes recommended at this time.  Your prescription(s) have been submitted to your pharmacy. Please take as directed and contact our office if you believe you are having problem(s) with the medication(s).   Please followup in one year   Health Maintenance, Female Adopting a healthy lifestyle and getting preventive care can go a long way to promote health and wellness. Talk with your health care provider about what schedule of regular examinations is right for you. This is a good chance for you to check in with your provider about disease prevention and staying healthy. In between checkups, there are plenty of things you can do on your own. Experts have done a lot of research about which lifestyle changes and preventive measures are most likely to keep you healthy. Ask your health care provider for more information. Weight and diet Eat a healthy diet  Be sure to include plenty of vegetables, fruits, low-fat dairy products, and lean protein.  Do not eat a lot of foods high in solid fats, added sugars, or salt.  Get regular exercise. This is one of the most important things you can do for your health.  Most adults should exercise for at least 150 minutes each week. The exercise should increase your heart rate and make you sweat (moderate-intensity exercise).  Most adults should also do strengthening exercises at least twice a week. This is in addition  to the moderate-intensity exercise. Maintain a healthy weight  Body mass index (BMI) is a measurement that can be used to identify possible weight problems. It estimates body fat based on height and weight. Your health care provider can help determine your BMI and help you achieve or maintain a healthy weight.  For females 28 years of age and older:  A BMI below 18.5 is considered underweight.  A BMI of 18.5 to 24.9 is normal.  A BMI of 25 to 29.9 is considered overweight.  A BMI of 30 and above is considered obese. Watch levels of cholesterol and blood lipids  You should start having your blood tested for lipids and cholesterol at 62 years of age, then have this test every 5 years.  You may need to have your cholesterol levels checked more often if:  Your lipid or cholesterol levels are high.  You are older than 62 years of age.  You are at high risk for heart disease. Cancer screening Lung Cancer  Lung cancer screening is recommended for adults 35-58 years old who are at high risk for lung cancer because of a history of smoking.  A yearly low-dose CT scan of the lungs is recommended for people who:  Currently smoke.  Have quit within the past 15 years.  Have at least a 30-pack-year history of smoking. A pack year is smoking an average of one pack of cigarettes a day for 1 year.  Yearly screening should continue until it has been 15 years since you quit.  Yearly screening should stop if you develop a health problem that would prevent  you from having lung cancer treatment. Breast Cancer  Practice breast self-awareness. This means understanding how your breasts normally appear and feel.  It also means doing regular breast self-exams. Let your health care provider know about any changes, no matter how small.  If you are in your 20s or 30s, you should have a clinical breast exam (CBE) by a health care provider every 1-3 years as part of a regular health exam.  If you are  30 or older, have a CBE every year. Also consider having a breast X-ray (mammogram) every year.  If you have a family history of breast cancer, talk to your health care provider about genetic screening.  If you are at high risk for breast cancer, talk to your health care provider about having an MRI and a mammogram every year.  Breast cancer gene (BRCA) assessment is recommended for women who have family members with BRCA-related cancers. BRCA-related cancers include:  Breast.  Ovarian.  Tubal.  Peritoneal cancers.  Results of the assessment will determine the need for genetic counseling and BRCA1 and BRCA2 testing. Cervical Cancer  Your health care provider may recommend that you be screened regularly for cancer of the pelvic organs (ovaries, uterus, and vagina). This screening involves a pelvic examination, including checking for microscopic changes to the surface of your cervix (Pap test). You may be encouraged to have this screening done every 3 years, beginning at age 28.  For women ages 26-65, health care providers may recommend pelvic exams and Pap testing every 3 years, or they may recommend the Pap and pelvic exam, combined with testing for human papilloma virus (HPV), every 5 years. Some types of HPV increase your risk of cervical cancer. Testing for HPV may also be done on women of any age with unclear Pap test results.  Other health care providers may not recommend any screening for nonpregnant women who are considered low risk for pelvic cancer and who do not have symptoms. Ask your health care provider if a screening pelvic exam is right for you.  If you have had past treatment for cervical cancer or a condition that could lead to cancer, you need Pap tests and screening for cancer for at least 20 years after your treatment. If Pap tests have been discontinued, your risk factors (such as having a new sexual partner) need to be reassessed to determine if screening should resume.  Some women have medical problems that increase the chance of getting cervical cancer. In these cases, your health care provider may recommend more frequent screening and Pap tests. Colorectal Cancer  This type of cancer can be detected and often prevented.  Routine colorectal cancer screening usually begins at 62 years of age and continues through 62 years of age.  Your health care provider may recommend screening at an earlier age if you have risk factors for colon cancer.  Your health care provider may also recommend using home test kits to check for hidden blood in the stool.  A small camera at the end of a tube can be used to examine your colon directly (sigmoidoscopy or colonoscopy). This is done to check for the earliest forms of colorectal cancer.  Routine screening usually begins at age 34.  Direct examination of the colon should be repeated every 5-10 years through 62 years of age. However, you may need to be screened more often if early forms of precancerous polyps or small growths are found. Skin Cancer  Check your skin from head to  toe regularly.  Tell your health care provider about any new moles or changes in moles, especially if there is a change in a mole's shape or color.  Also tell your health care provider if you have a mole that is larger than the size of a pencil eraser.  Always use sunscreen. Apply sunscreen liberally and repeatedly throughout the day.  Protect yourself by wearing long sleeves, pants, a wide-brimmed hat, and sunglasses whenever you are outside. Heart disease, diabetes, and high blood pressure  High blood pressure causes heart disease and increases the risk of stroke. High blood pressure is more likely to develop in:  People who have blood pressure in the high end of the normal range (130-139/85-89 mm Hg).  People who are overweight or obese.  People who are African American.  If you are 46-67 years of age, have your blood pressure checked  every 3-5 years. If you are 68 years of age or older, have your blood pressure checked every year. You should have your blood pressure measured twice-once when you are at a hospital or clinic, and once when you are not at a hospital or clinic. Record the average of the two measurements. To check your blood pressure when you are not at a hospital or clinic, you can use:  An automated blood pressure machine at a pharmacy.  A home blood pressure monitor.  If you are between 93 years and 29 years old, ask your health care provider if you should take aspirin to prevent strokes.  Have regular diabetes screenings. This involves taking a blood sample to check your fasting blood sugar level.  If you are at a normal weight and have a low risk for diabetes, have this test once every three years after 62 years of age.  If you are overweight and have a high risk for diabetes, consider being tested at a younger age or more often. Preventing infection Hepatitis B  If you have a higher risk for hepatitis B, you should be screened for this virus. You are considered at high risk for hepatitis B if:  You were born in a country where hepatitis B is common. Ask your health care provider which countries are considered high risk.  Your parents were born in a high-risk country, and you have not been immunized against hepatitis B (hepatitis B vaccine).  You have HIV or AIDS.  You use needles to inject street drugs.  You live with someone who has hepatitis B.  You have had sex with someone who has hepatitis B.  You get hemodialysis treatment.  You take certain medicines for conditions, including cancer, organ transplantation, and autoimmune conditions. Hepatitis C  Blood testing is recommended for:  Everyone born from [birth year] through 1966.  Anyone with known risk factors for hepatitis C. Sexually transmitted infections (STIs)  You should be screened for sexually transmitted infections (STIs) including  gonorrhea and chlamydia if:  You are sexually active and are younger than 62 years of age.  You are older than 62 years of age and your health care provider tells you that you are at risk for this type of infection.  Your sexual activity has changed since you were last screened and you are at an increased risk for chlamydia or gonorrhea. Ask your health care provider if you are at risk.  If you do not have HIV, but are at risk, it may be recommended that you take a prescription medicine daily to prevent HIV infection. This is called  pre-exposure prophylaxis (PrEP). You are considered at risk if:  You are sexually active and do not regularly use condoms or know the HIV status of your partner(s).  You take drugs by injection.  You are sexually active with a partner who has HIV. Talk with your health care provider about whether you are at high risk of being infected with HIV. If you choose to begin PrEP, you should first be tested for HIV. You should then be tested every 3 months for as long as you are taking PrEP. Pregnancy  If you are premenopausal and you may become pregnant, ask your health care provider about preconception counseling.  If you may become pregnant, take 400 to 800 micrograms (mcg) of folic acid every day.  If you want to prevent pregnancy, talk to your health care provider about birth control (contraception). Osteoporosis and menopause  Osteoporosis is a disease in which the bones lose minerals and strength with aging. This can result in serious bone fractures. Your risk for osteoporosis can be identified using a bone density scan.  If you are 38 years of age or older, or if you are at risk for osteoporosis and fractures, ask your health care provider if you should be screened.  Ask your health care provider whether you should take a calcium or vitamin D supplement to lower your risk for osteoporosis.  Menopause may have certain physical symptoms and risks.  Hormone  replacement therapy may reduce some of these symptoms and risks. Talk to your health care provider about whether hormone replacement therapy is right for you. Follow these instructions at home:  Schedule regular health, dental, and eye exams.  Stay current with your immunizations.  Do not use any tobacco products including cigarettes, chewing tobacco, or electronic cigarettes.  If you are pregnant, do not drink alcohol.  If you are breastfeeding, limit how much and how often you drink alcohol.  Limit alcohol intake to no more than 1 drink per day for nonpregnant women. One drink equals 12 ounces of beer, 5 ounces of wine, or 1 ounces of hard liquor.  Do not use street drugs.  Do not share needles.  Ask your health care provider for help if you need support or information about quitting drugs.  Tell your health care provider if you often feel depressed.  Tell your health care provider if you have ever been abused or do not feel safe at home. This information is not intended to replace advice given to you by your health care provider. Make sure you discuss any questions you have with your health care provider. Document Released: 12/28/2010 Document Revised: 11/20/2015 Document Reviewed: 03/18/2015 Elsevier Interactive Patient Education  2017 [employer].

## 2016-11-11 NOTE — Assessment & Plan Note (Signed)
Check lipid panel  Continue daily statin Regular exercise and healthy diet encouraged  

## 2016-11-11 NOTE — Assessment & Plan Note (Signed)
BP well controlled Current regimen effective and well tolerated Continue current medications at current doses  

## 2016-11-11 NOTE — Assessment & Plan Note (Signed)
GERD controlled Continue daily medication Work on weight loss

## 2016-12-08 ENCOUNTER — Other Ambulatory Visit: Payer: Self-pay | Admitting: Internal Medicine

## 2016-12-21 DIAGNOSIS — H35033 Hypertensive retinopathy, bilateral: Secondary | ICD-10-CM | POA: Diagnosis not present

## 2016-12-22 DIAGNOSIS — R21 Rash and other nonspecific skin eruption: Secondary | ICD-10-CM | POA: Diagnosis not present

## 2017-01-14 DIAGNOSIS — Z01419 Encounter for gynecological examination (general) (routine) without abnormal findings: Secondary | ICD-10-CM | POA: Diagnosis not present

## 2017-01-14 DIAGNOSIS — Z6839 Body mass index (BMI) 39.0-39.9, adult: Secondary | ICD-10-CM | POA: Diagnosis not present

## 2017-03-16 ENCOUNTER — Encounter: Payer: Self-pay | Admitting: Internal Medicine

## 2017-03-23 ENCOUNTER — Encounter: Payer: Self-pay | Admitting: Internal Medicine

## 2017-04-15 ENCOUNTER — Other Ambulatory Visit: Payer: Self-pay | Admitting: Obstetrics and Gynecology

## 2017-04-15 DIAGNOSIS — Z1231 Encounter for screening mammogram for malignant neoplasm of breast: Secondary | ICD-10-CM

## 2017-05-11 ENCOUNTER — Other Ambulatory Visit: Payer: Self-pay

## 2017-05-11 ENCOUNTER — Ambulatory Visit (AMBULATORY_SURGERY_CENTER): Payer: Self-pay | Admitting: *Deleted

## 2017-05-11 VITALS — Ht 63.0 in | Wt 224.0 lb

## 2017-05-11 DIAGNOSIS — Z860100 Personal history of colon polyps, unspecified: Secondary | ICD-10-CM

## 2017-05-11 NOTE — Progress Notes (Signed)
No egg or soy allergy known to patient  No issues with past sedation with any surgeries  or procedures, no intubation problems  No diet pills per patient No home 02 use per patient  No blood thinners per patient  Pt denies issues with constipation  No A fib or A flutter  EMMI video sent to pt's e mail -pt decliend

## 2017-05-24 ENCOUNTER — Ambulatory Visit
Admission: RE | Admit: 2017-05-24 | Discharge: 2017-05-24 | Disposition: A | Discharge status: Home / Self Care | Payer: BLUE CROSS/BLUE SHIELD | Source: Ambulatory Visit | Attending: Obstetrics and Gynecology | Admitting: Obstetrics and Gynecology

## 2017-05-24 DIAGNOSIS — Z1231 Encounter for screening mammogram for malignant neoplasm of breast: Secondary | ICD-10-CM | POA: Diagnosis not present

## 2017-05-25 ENCOUNTER — Other Ambulatory Visit: Payer: Self-pay

## 2017-05-25 ENCOUNTER — Ambulatory Visit (AMBULATORY_SURGERY_CENTER): Payer: BLUE CROSS/BLUE SHIELD | Admitting: Internal Medicine

## 2017-05-25 ENCOUNTER — Other Ambulatory Visit: Payer: Self-pay | Admitting: Obstetrics and Gynecology

## 2017-05-25 ENCOUNTER — Encounter: Payer: Self-pay | Admitting: Internal Medicine

## 2017-05-25 VITALS — BP 145/79 | HR 65 | Temp 98.6°F | Resp 20 | Ht 63.0 in | Wt 224.0 lb

## 2017-05-25 DIAGNOSIS — K635 Polyp of colon: Secondary | ICD-10-CM

## 2017-05-25 DIAGNOSIS — D120 Benign neoplasm of cecum: Secondary | ICD-10-CM | POA: Diagnosis not present

## 2017-05-25 DIAGNOSIS — D126 Benign neoplasm of colon, unspecified: Secondary | ICD-10-CM

## 2017-05-25 DIAGNOSIS — D123 Benign neoplasm of transverse colon: Secondary | ICD-10-CM

## 2017-05-25 DIAGNOSIS — Z860100 Personal history of colon polyps, unspecified: Secondary | ICD-10-CM

## 2017-05-25 DIAGNOSIS — R928 Other abnormal and inconclusive findings on diagnostic imaging of breast: Secondary | ICD-10-CM

## 2017-05-25 MED ORDER — SODIUM CHLORIDE 0.9 % IV SOLN: 500.0000 mL | INTRAVENOUS | Status: DC

## 2017-05-25 NOTE — Progress Notes (Signed)
Pt's states no medical or surgical changes since previsit or office visit. 

## 2017-05-25 NOTE — Patient Instructions (Addendum)
   I found and removed 4 small polyps - all look benign.  All else normal.  I will let you know pathology results and when to have another routine colonoscopy by mail and/or My Chart.  I appreciate the opportunity to care for you. Gatha Mayer, MD, [facility]   **Handout given on polyps**   YOU HAD AN ENDOSCOPIC PROCEDURE TODAY: Refer to the procedure report and other information in the discharge instructions given to you for any specific questions about what was found during the examination. If this information does not answer your questions, please call [place] office at [phone] to clarify.   YOU SHOULD EXPECT: Some feelings of bloating in the abdomen. Passage of more gas than usual. Walking can help get rid of the air that was put into your GI tract during the procedure and reduce the bloating. If you had a lower endoscopy (such as a colonoscopy or flexible sigmoidoscopy) you may notice spotting of blood in your stool or on the toilet paper. Some abdominal soreness may be present for a day or two, also.  DIET: Your first meal following the procedure should be a light meal and then it is ok to progress to your normal diet. A half-sandwich or bowl of soup is an example of a good first meal. Heavy or fried foods are harder to digest and may make you feel nauseous or bloated. Drink plenty of fluids but you should avoid alcoholic beverages for 24 hours. If you had a esophageal dilation, please see attached instructions for diet.    ACTIVITY: Your care partner should take you home directly after the procedure. You should plan to take it easy, moving slowly for the rest of the day. You can resume normal activity the day after the procedure however YOU SHOULD NOT DRIVE, use power tools, machinery or perform tasks that involve climbing or major physical exertion for 24 hours (because of the sedation medicines used during the test).   SYMPTOMS TO REPORT IMMEDIATELY: A gastroenterologist can be  reached at any hour. Please call [phone]  for any of the following symptoms:  Following lower endoscopy (colonoscopy, flexible sigmoidoscopy) Excessive amounts of blood in the stool  Significant tenderness, worsening of abdominal pains  Swelling of the abdomen that is new, acute  Fever of 100 or higher    FOLLOW UP:  If any biopsies were taken you will be contacted by phone or by letter within the next 1-3 weeks. Call [phone]  if you have not heard about the biopsies in 3 weeks.  Please also call with any specific questions about appointments or follow up tests.

## 2017-05-25 NOTE — Progress Notes (Signed)
Called to room to assist during endoscopic procedure.  Patient ID and intended procedure confirmed with present staff. Received instructions for my participation in the procedure from the performing physician.  

## 2017-05-25 NOTE — Op Note (Signed)
[place] Patient Name: Tami Hughes Procedure Date: 05/25/2017 7:51 AM MRN: [id number] Endoscopist: Gatha Mayer , MD Age: 61 Referring MD:  Date of Birth: December 04, 1955 Gender: Female Account #: [id number] Procedure:                Colonoscopy Indications:              Surveillance: Personal history of adenomatous                            polyps on last colonoscopy 5 years ago Medicines:                Propofol per Anesthesia, Monitored Anesthesia Care Procedure:                Pre-Anesthesia Assessment:                           - Prior to the procedure, a History and Physical                            was performed, and patient medications and                            allergies were reviewed. The patient's tolerance of                            previous anesthesia was also reviewed. The risks                            and benefits of the procedure and the sedation                            options and risks were discussed with the patient.                            All questions were answered, and informed consent                            was obtained. Prior Anticoagulants: The patient has                            taken no previous anticoagulant or antiplatelet                            agents. ASA Grade Assessment: II - A patient with                            mild systemic disease. After reviewing the risks                            and benefits, the patient was deemed in                            satisfactory condition to undergo the procedure.  After obtaining informed consent, the colonoscope                            was passed under direct vision. Throughout the                            procedure, the patient's blood pressure, pulse, and                            oxygen saturations were monitored continuously. The                            Colonoscope was introduced through the anus and   advanced to the the cecum, identified by                            appendiceal orifice and ileocecal valve. The                            colonoscopy was performed without difficulty. The                            patient tolerated the procedure well. The quality                            of the bowel preparation was good. The ileocecal                            valve, appendiceal orifice, and rectum were                            photographed. The bowel preparation used was                            Miralax. Scope In: 8:05:49 AM Scope Out: 8:24:48 AM Scope Withdrawal Time: 0 hours 14 minutes 38 seconds  Total Procedure Duration: 0 hours 18 minutes 59 seconds  Findings:                 The perianal and digital rectal examinations were                            normal.                           Two sessile polyps were found in the transverse                            colon. The polyps were diminutive in size. These                            polyps were removed with a cold snare. Resection                            and retrieval were complete. Verification of  patient identification for the specimen was done.                            Estimated blood loss was minimal.                           Two sessile polyps were found in the cecum. The                            polyps were diminutive in size. These polyps were                            removed with a cold biopsy forceps. Resection and                            retrieval were complete. Verification of patient                            identification for the specimen was done. Estimated                            blood loss was minimal.                           The exam was otherwise without abnormality on                            direct and retroflexion views. Complications:            No immediate complications. Estimated Blood Loss:     Estimated blood loss was minimal. Impression:                - Two diminutive polyps in the transverse colon,                            removed with a cold snare. Resected and retrieved.                           - Two diminutive polyps in the cecum, removed with                            a cold biopsy forceps. Resected and retrieved.                           - The examination was otherwise normal on direct                            and retroflexion views.                           - Personal history of colonic polyp 12 mm adenoma                            2009, none in 2013. Recommendation:           -  Patient has a contact number available for                            emergencies. The signs and symptoms of potential                            delayed complications were discussed with the                            patient. Return to normal activities tomorrow.                            Written discharge instructions were provided to the                            patient.                           - Resume previous diet.                           - Continue present medications.                           - Repeat colonoscopy is recommended for                            surveillance. The colonoscopy date will be                            determined after pathology results from today's                            exam become available for review. Gatha Mayer, MD 05/25/2017 8:39:26 AM This report has been signed electronically.

## 2017-05-25 NOTE — Progress Notes (Signed)
A/ox3 pleased with MAC, report to Megan RN 

## 2017-05-26 ENCOUNTER — Telehealth: Payer: Self-pay

## 2017-05-26 NOTE — Telephone Encounter (Signed)
  Follow up Call-  Call back number 05/25/2017  Post procedure Call Back phone  # [phone]  Permission to leave phone message Yes  Some recent data might be hidden     Patient questions:  Do you have a fever, pain , or abdominal swelling? No. Pain Score  0 *  Have you tolerated food without any problems? Yes.    Have you been able to return to your normal activities? Yes.    Do you have any questions about your discharge instructions: Diet   No. Medications  No. Follow up visit  No.  Do you have questions or concerns about your Care? No.  Actions: * If pain score is 4 or above: No action needed, pain <4.

## 2017-05-26 NOTE — Telephone Encounter (Signed)
  Follow up Call-  Call back number 05/25/2017  Post procedure Call Back phone  # [phone]  Permission to leave phone message Yes  Some recent data might be hidden     Patient questions:  Do you have a fever, pain , or abdominal swelling? No. Pain Score  0 *  Have you tolerated food without any problems? Yes.    Have you been able to return to your normal activities? Yes.    Do you have any questions about your discharge instructions: Diet   No. Medications  No. Follow up visit  No.  Do you have questions or concerns about your Care? No.  Actions: * If pain score is 4 or above: No action needed, pain <4.

## 2017-05-26 NOTE — Telephone Encounter (Signed)
  Follow up Call-  Call back number 05/25/2017  Post procedure Call Back phone  # [phone]  Permission to leave phone message Yes  Some recent data might be hidden     Patient questions:  Do you have a fever, pain , or abdominal swelling? No. Pain Score  0 *  Have you tolerated food without any problems? Yes.    Have you been able to return to your normal activities? Yes.    Do you have any questions about your discharge instructions: Diet   No. Medications  No. Follow up visit  No.  Do you have questions or concerns about your Care? No.  Actions: * If pain score is 4 or above: No action needed, pain <4.

## 2017-05-30 ENCOUNTER — Encounter: Payer: Self-pay | Admitting: Internal Medicine

## 2017-05-30 NOTE — Progress Notes (Signed)
3 diminutive adenomas + hyperplastic polyp Recall colonoscopy 2021 My Chart letter

## 2017-05-31 ENCOUNTER — Ambulatory Visit
Admission: RE | Admit: 2017-05-31 | Discharge: 2017-05-31 | Disposition: A | Discharge status: Home / Self Care | Payer: BLUE CROSS/BLUE SHIELD | Source: Ambulatory Visit | Attending: Obstetrics and Gynecology | Admitting: Obstetrics and Gynecology

## 2017-05-31 DIAGNOSIS — R928 Other abnormal and inconclusive findings on diagnostic imaging of breast: Secondary | ICD-10-CM

## 2017-05-31 DIAGNOSIS — N6489 Other specified disorders of breast: Secondary | ICD-10-CM | POA: Diagnosis not present

## 2017-05-31 IMAGING — MG 2D DIGITAL DIAGNOSTIC UNILATERAL RIGHT MAMMOGRAM WITH CAD AND AD
6 series · 6 of 14 positions shown · non-contrast
Comparison: Mammography [DATE], [DATE] and earlier.

CLINICAL DATA: Recall from screening mammography, possible mass in
the upper inner right breast at middle depth.

EXAM:
2D DIGITAL DIAGNOSTIC RIGHT MAMMOGRAM WITH ADJUNCT TOMO
ULTRASOUND RIGHT BREAST

[R MLO synth-2D]
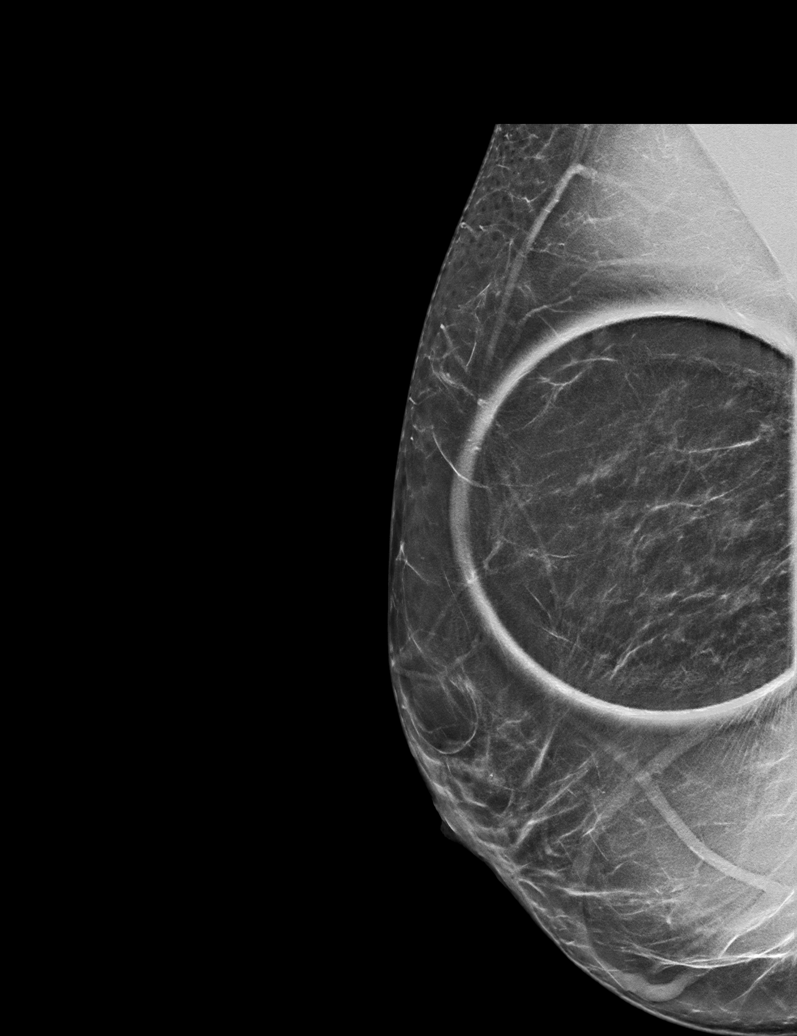

[R MLO]
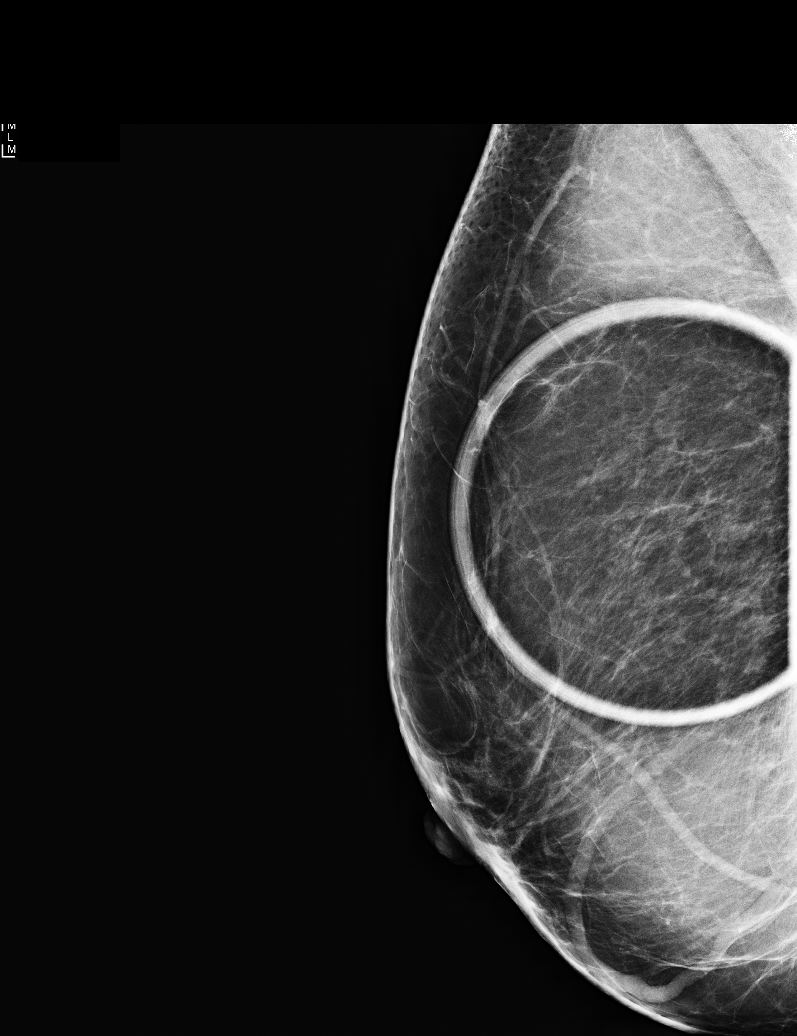

[R CC]
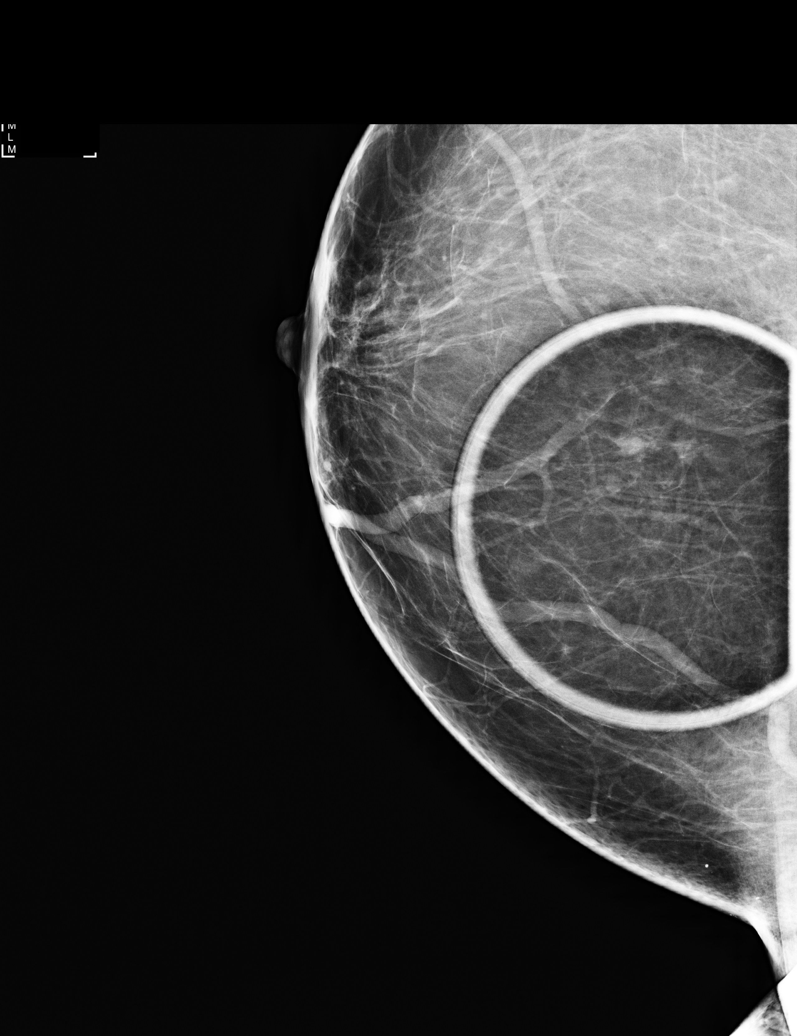

[R CC synth-2D]
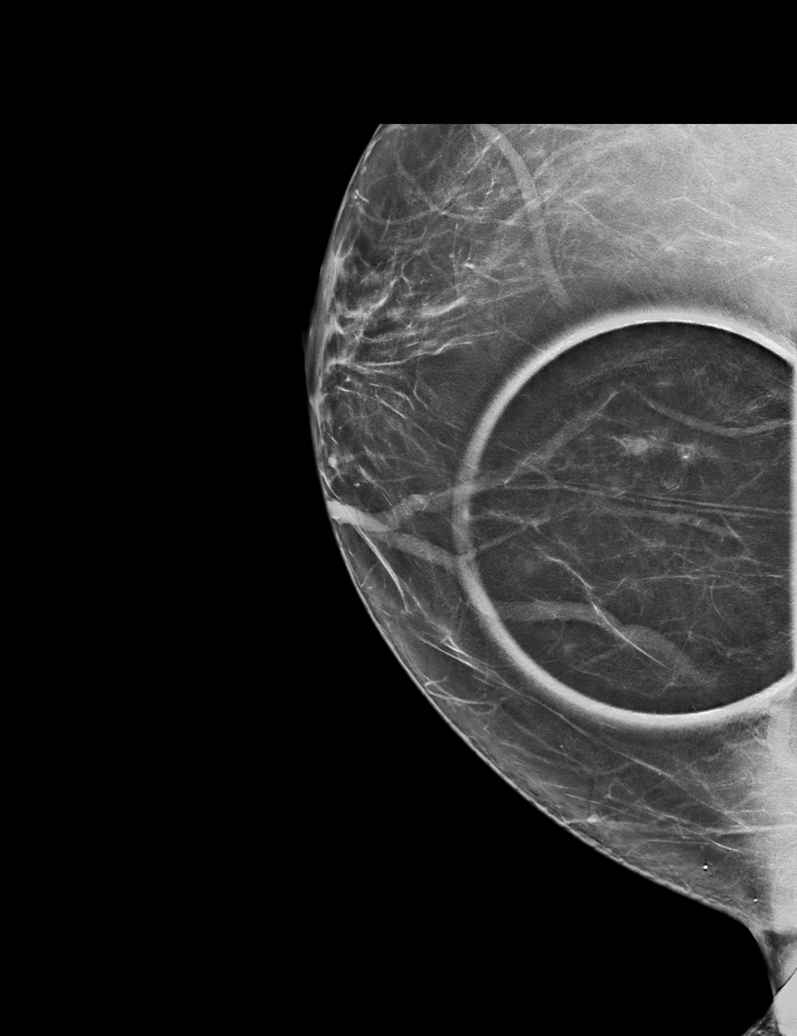

[R MLO tomo · tomo slice 39/78.0]
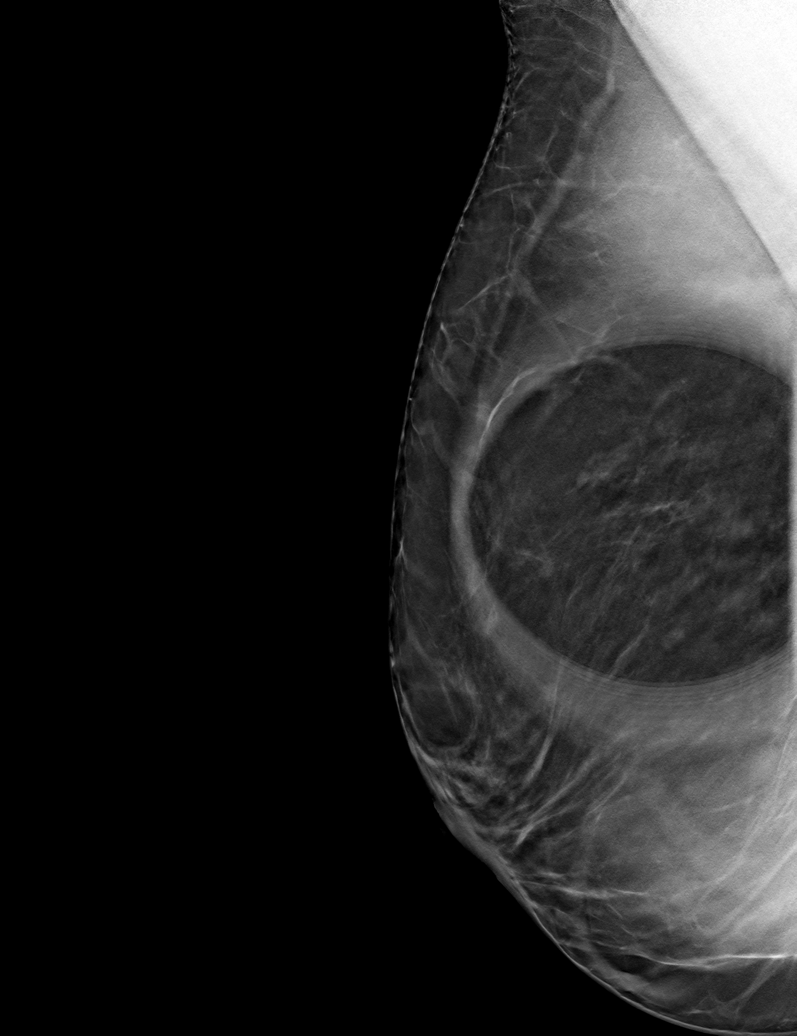

[R CC tomo · tomo slice 38/75.0]
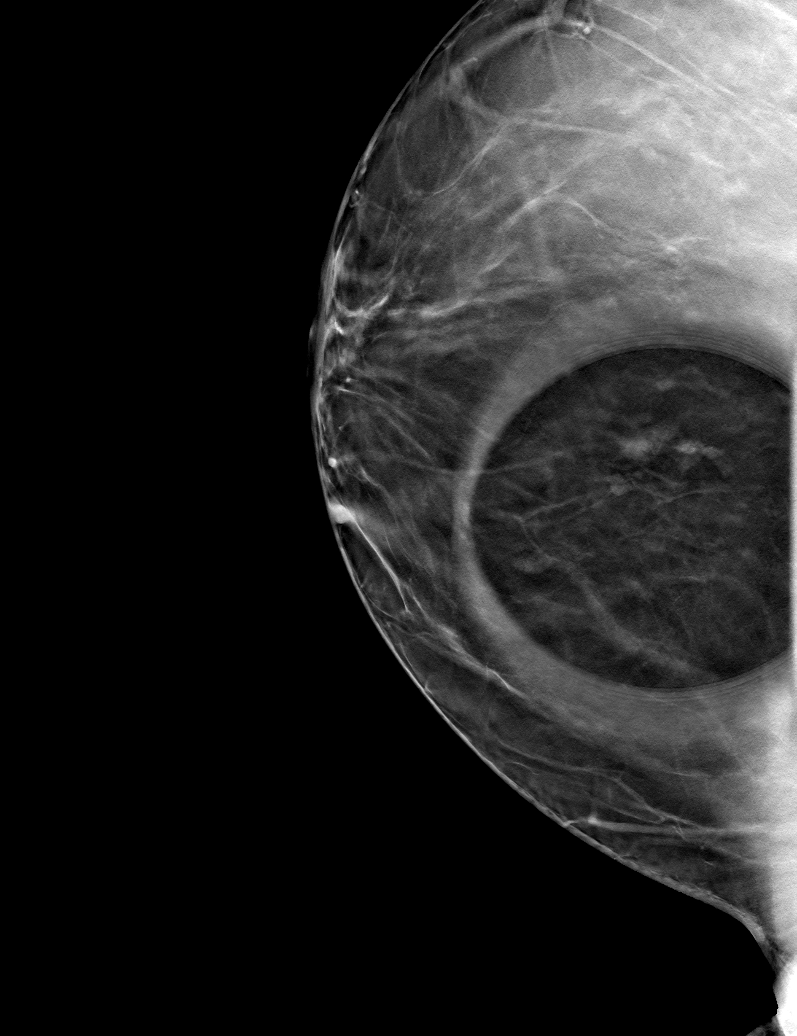

[6 of 14 positions shown; findings below may reference images not displayed]

Right
breast ultrasound [DATE], [DATE] and earlier were performed
in a different part of the breast.

ACR Breast Density Category b: There are scattered areas of
fibroglandular density.
FINDINGS: Standard and tomosynthesis spot-compression CC and MLO views of the
area of concern in the right breast were obtained.

These confirm a circumscribed oval low-density mass measuring
approximately 6-7 mm without associated architectural distortion or
suspicious calcification. There may be fat centrally within the
mass. A similar mass is present in the upper outer quadrant which
has been stable on multiple prior mammograms.

On physical exam, there is no palpable abnormality in the upper
inner right breast.

Targeted right breast ultrasound is performed, showing adjacent
hyperechoic foci within a fat lobule at the 1:30 o'clock position
approximately 7 cm from the nipple measuring in total approximately
4 x 7 x 4 mm, corresponding to the screening mammographic finding.
No suspicious solid mass or abnormal acoustic shadowing is
identified in the upper inner quadrant with imaging from 12 o'clock
through 4 o'clock.
IMPRESSION: 1. Benign fat necrosis involving the upper inner quadrant of the
right breast corresponding to the screening mammographic finding.
2. No mammographic or sonographic evidence of malignancy involving
the right breast.

RECOMMENDATION:
Screening mammogram in one year.(Code:[IP])

I have discussed the findings and recommendations with the patient.
Results were also provided in writing at the conclusion of the
visit. If applicable, a reminder letter will be sent to the patient
regarding the next appointment.

BI-RADS CATEGORY  2: Benign.

## 2017-08-25 ENCOUNTER — Ambulatory Visit: Payer: Self-pay | Admitting: Internal Medicine

## 2017-11-14 ENCOUNTER — Encounter: Payer: Self-pay | Admitting: Internal Medicine

## 2017-11-14 NOTE — Progress Notes (Signed)
Subjective:    Patient ID: Tami Hughes, female    DOB: [birth year], 62 y.o.   MRN: [id number]  HPI She is here for a physical exam.   She is concerned about weight.  She is a stress eater.  She wants to lose weight.  Through [place] she can see a nutritionist.  She feels like she needs help to get her back on track.      Medications and allergies reviewed with patient and updated if appropriate.  Patient Active Problem List   Diagnosis Date Noted  . Prediabetes 11/11/2016  . Herpes simplex without mention of complication 62/37/6283  . Menopause syndrome 08/21/2012  . Obesity, Class II, BMI 35-39.9 05/02/2012  . Personal history of colonic polyps 01/27/2012  . VENOUS INSUFFICIENCY, LEGS 08/28/2010  . Hyperlipidemia 02/22/2008  . Essential hypertension 02/22/2008  . GERD 02/22/2008    Current Outpatient Medications on File Prior to Visit  Medication Sig Dispense Refill  . CARTIA XT 240 MG 24 hr capsule TAKE 1 CAPSULE BY MOUTH ONCE DAILY 90 capsule 3  . cetirizine-pseudoephedrine (ZYRTEC-D) 5-120 MG tablet Take 1 tablet by mouth 2 (two) times daily. (Patient taking differently: Take 1 tablet as needed by mouth. ) 14 tablet 0  . diclofenac sodium (VOLTAREN) 1 % GEL Apply 4 g topically 4 (four) times daily. 100 g 8  . levocetirizine (XYZAL) 5 MG tablet Take 1 tablet (5 mg total) by mouth every evening. 30 tablet 1  . lisinopril-hydrochlorothiazide (PRINZIDE,ZESTORETIC) 20-12.5 MG tablet TAKE 1 TABLET BY MOUTH ONCE DAILY 90 tablet 3  . pantoprazole (PROTONIX) 40 MG tablet Take 1 tablet (40 mg total) by mouth 2 (two) times daily before a meal. 180 tablet 1  . Pseudoephedrine HCl 240 MG TB24 Take 1 tablet (240 mg total) by mouth daily as needed. 28 each 0  . valACYclovir (VALTREX) 1000 MG tablet Take 1 tablet (1,000 mg total) by mouth 2 (two) times daily. 20 tablet 0  . vitamin E (VITAMIN E) 400 UNIT capsule Take 400 Units by mouth daily.       No current facility-administered  medications on file prior to visit.     Past Medical History:  Diagnosis Date  . Allergy   . Arthritis   . Dyslipidemia   . GERD (gastroesophageal reflux disease)   . Hyperlipidemia   . Hypertension   . Polyp of colon 2009   adenoma benign  . Shingles    recurrent: 08/2010, 10/2013    Past Surgical History:  Procedure Laterality Date  . ABDOMINAL HYSTERECTOMY     TAH  . CHOLECYSTECTOMY N/A 12/18/2014   Procedure: LAPAROSCOPIC CHOLECYSTECTOMY WITH INTRAOPERATIVE CHOLANGIOGRAM;  Surgeon: Johnathan Hausen, MD;  Location: [facility];  Service: General;  Laterality: N/A;  . COLONOSCOPY    . LABIOPLASTY    . POLYPECTOMY    . PUBOVAGINAL SLING  05/08/2010   stress urinary incontinence  . VULVECTOMY     partial vulvectomy secondary to labial hypertrophy..    Social History   Socioeconomic History  . Marital status: Widowed    Spouse name: Not on file  . Number of children: 2  . Years of education: 49  . Highest education level: Not on file  Occupational History  . Occupation: Office manager: [employer]  Social Needs  . Financial resource strain: Not on file  . Food insecurity:    Worry: Not on file    Inability: Not on file  . Transportation needs:  Medical: Not on file    Non-medical: Not on file  Tobacco Use  . Smoking status: Never Smoker  . Smokeless tobacco: Never Used  Substance and Sexual Activity  . Alcohol use: Yes    Alcohol/week: 1.2 oz    Types: 2 Standard drinks or equivalent per week    Comment: wine on weekends  . Drug use: No  . Sexual activity: Yes    Partners: Male  Lifestyle  . Physical activity:    Days per week: Not on file    Minutes per session: Not on file  . Stress: Not on file  Relationships  . Social connections:    Talks on phone: Not on file    Gets together: Not on file    Attends religious service: Not on file    Active member of club or organization: Not on file    Attends meetings of clubs or organizations: Not on file     Relationship status: Not on file  Other Topics Concern  . Not on file  Social History Narrative   [employer], [employer] - BA, [employer] - masters in Energy manager. Married '74 - 27 yrs/widowed. 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 Has high potential for work but will finish master's first. Lives alone.       CPA   Widow, single   2 children, 2 grandchildren   No regular exercise   1 large cup of coffee daily    Family History  Problem Relation Age of Onset  . Kidney disease Mother        had transplant  . Breast cancer Mother   . Heart disease Sister   . Heart attack Sister   . Colon cancer Neg Hx   . Esophageal cancer Neg Hx   . Rectal cancer Neg Hx   . Stomach cancer Neg Hx   . Diabetes Neg Hx   . Hyperlipidemia Neg Hx   . COPD Neg Hx   . Colon polyps Neg Hx     Review of Systems  Constitutional: Negative for fever.  Eyes: Negative for visual disturbance.  Respiratory: Negative for cough, shortness of breath and wheezing.   Cardiovascular: Positive for leg swelling (mild, occ). Negative for chest pain and palpitations.  Gastrointestinal: Negative for abdominal pain, blood in stool, constipation, diarrhea and nausea.       Occ gerd  Genitourinary: Negative for dysuria and hematuria.  Musculoskeletal: Positive for arthralgias (knees) and back pain (lower back pain).  Skin: Negative for color change and rash.  Neurological: Positive for light-headedness (allergy related only) and headaches (allergy related only).  Psychiatric/Behavioral: Negative for dysphoric mood. The patient is not nervous/anxious.        Objective:   Vitals:   11/15/17 0745  BP: (!) 150/78  Pulse: 84  Resp: 16  Temp: 98.7 F (37.1 C)  SpO2: 98%   Filed Weights   11/15/17 0745  Weight: 217 lb (98.4 kg)   Body mass index is 38.44 kg/m.  Wt Readings from Last 3 Encounters:  11/15/17 217 lb (98.4 kg)  05/25/17 224 lb (101.6 kg)  05/11/17 224 lb  (101.6 kg)     Physical Exam Constitutional: She appears well-developed and well-nourished. No distress.  HENT:  Head: Normocephalic and atraumatic.  Right Ear: External ear normal. Normal ear canal and TM Left Ear: External ear normal.  Normal ear canal and TM Mouth/Throat: Oropharynx is clear and moist.  Eyes: Conjunctivae and  EOM are normal.  Neck: Neck supple. No tracheal deviation present. No thyromegaly present.  No carotid bruit  Cardiovascular: Normal rate, regular rhythm and normal heart sounds.   No murmur heard.  No edema. Pulmonary/Chest: Effort normal and breath sounds normal. No respiratory distress. She has no wheezes. She has no rales.  Breast: deferred to Gyn Abdominal: Soft. She exhibits no distension. There is no tenderness.  Lymphadenopathy: She has no cervical adenopathy.  Skin: Skin is warm and dry. She is not diaphoretic.  Psychiatric: She has a normal mood and affect. Her behavior is normal.        Assessment & Plan:   Physical exam: Screening blood work   ordered Immunizations  Discussed shingles, others up to date Colonoscopy   Up to date  Mammogram    Up to date  Gyn   Up to date - saw gyn last July  - s/p hysterectomy  Dexa  Normal in 2013 - will repeat at 60 Eye exams   Up to date  EKG    Done 08/2016 Exercise  Not right now - will start Weight  Discussed weight at length - wants to lose weight Skin   No concerns Substance abuse   none  See Problem List for Assessment and Plan of chronic medical problems.     FU in 6 months

## 2017-11-14 NOTE — Patient Instructions (Addendum)
Monitor your BP - it should be less than 140/90.  Test(s) ordered today. Your results will be released to [facility] (or called to you) after review, usually within 72hours after test completion. If any changes need to be made, you will be notified at that same time.  All other Health Maintenance issues reviewed.   All recommended immunizations and age-appropriate screenings are up-to-date or discussed.  No immunizations administered today.   Medications reviewed and updated.  Changes include trying phentermine for weight loss.    Your prescription(s) have been submitted to your pharmacy. Please take as directed and contact our office if you believe you are having problem(s) with the medication(s).   Please followup in 6 months    Health Maintenance, Female Adopting a healthy lifestyle and getting preventive care can go a long way to promote health and wellness. Talk with your health care provider about what schedule of regular examinations is right for you. This is a good chance for you to check in with your provider about disease prevention and staying healthy. In between checkups, there are plenty of things you can do on your own. Experts have done a lot of research about which lifestyle changes and preventive measures are most likely to keep you healthy. Ask your health care provider for more information. Weight and diet Eat a healthy diet  Be sure to include plenty of vegetables, fruits, low-fat dairy products, and lean protein.  Do not eat a lot of foods high in solid fats, added sugars, or salt.  Get regular exercise. This is one of the most important things you can do for your health. ? Most adults should exercise for at least 150 minutes each week. The exercise should increase your heart rate and make you sweat (moderate-intensity exercise). ? Most adults should also do strengthening exercises at least twice a week. This is in addition to the moderate-intensity  exercise.  Maintain a healthy weight  Body mass index (BMI) is a measurement that can be used to identify possible weight problems. It estimates body fat based on height and weight. Your health care provider can help determine your BMI and help you achieve or maintain a healthy weight.  For females 36 years of age and older: ? A BMI below 18.5 is considered underweight. ? A BMI of 18.5 to 24.9 is normal. ? A BMI of 25 to 29.9 is considered overweight. ? A BMI of 30 and above is considered obese.  Watch levels of cholesterol and blood lipids  You should start having your blood tested for lipids and cholesterol at 63 years of age, then have this test every 5 years.  You may need to have your cholesterol levels checked more often if: ? Your lipid or cholesterol levels are high. ? You are older than 63 years of age. ? You are at high risk for heart disease.  Cancer screening Lung Cancer  Lung cancer screening is recommended for adults 81-72 years old who are at high risk for lung cancer because of a history of smoking.  A yearly low-dose CT scan of the lungs is recommended for people who: ? Currently smoke. ? Have quit within the past 15 years. ? Have at least a 30-pack-year history of smoking. A pack year is smoking an average of one pack of cigarettes a day for 1 year.  Yearly screening should continue until it has been 15 years since you quit.  Yearly screening should stop if you develop a health problem that  would prevent you from having lung cancer treatment.  Breast Cancer  Practice breast self-awareness. This means understanding how your breasts normally appear and feel.  It also means doing regular breast self-exams. Let your health care provider know about any changes, no matter how small.  If you are in your 20s or 30s, you should have a clinical breast exam (CBE) by a health care provider every 1-3 years as part of a regular health exam.  If you are 53 or older, have  a CBE every year. Also consider having a breast X-ray (mammogram) every year.  If you have a family history of breast cancer, talk to your health care provider about genetic screening.  If you are at high risk for breast cancer, talk to your health care provider about having an MRI and a mammogram every year.  Breast cancer gene (BRCA) assessment is recommended for women who have family members with BRCA-related cancers. BRCA-related cancers include: ? Breast. ? Ovarian. ? Tubal. ? Peritoneal cancers.  Results of the assessment will determine the need for genetic counseling and BRCA1 and BRCA2 testing.  Cervical Cancer Your health care provider may recommend that you be screened regularly for cancer of the pelvic organs (ovaries, uterus, and vagina). This screening involves a pelvic examination, including checking for microscopic changes to the surface of your cervix (Pap test). You may be encouraged to have this screening done every 3 years, beginning at age 21.  For women ages 20-65, health care providers may recommend pelvic exams and Pap testing every 3 years, or they may recommend the Pap and pelvic exam, combined with testing for human papilloma virus (HPV), every 5 years. Some types of HPV increase your risk of cervical cancer. Testing for HPV may also be done on women of any age with unclear Pap test results.  Other health care providers may not recommend any screening for nonpregnant women who are considered low risk for pelvic cancer and who do not have symptoms. Ask your health care provider if a screening pelvic exam is right for you.  If you have had past treatment for cervical cancer or a condition that could lead to cancer, you need Pap tests and screening for cancer for at least 20 years after your treatment. If Pap tests have been discontinued, your risk factors (such as having a new sexual partner) need to be reassessed to determine if screening should resume. Some women have  medical problems that increase the chance of getting cervical cancer. In these cases, your health care provider may recommend more frequent screening and Pap tests.  Colorectal Cancer  This type of cancer can be detected and often prevented.  Routine colorectal cancer screening usually begins at 63 years of age and continues through 63 years of age.  Your health care provider may recommend screening at an earlier age if you have risk factors for colon cancer.  Your health care provider may also recommend using home test kits to check for hidden blood in the stool.  A small camera at the end of a tube can be used to examine your colon directly (sigmoidoscopy or colonoscopy). This is done to check for the earliest forms of colorectal cancer.  Routine screening usually begins at age 20.  Direct examination of the colon should be repeated every 5-10 years through 63 years of age. However, you may need to be screened more often if early forms of precancerous polyps or small growths are found.  Skin Cancer  Check  your skin from head to toe regularly.  Tell your health care provider about any new moles or changes in moles, especially if there is a change in a mole's shape or color.  Also tell your health care provider if you have a mole that is larger than the size of a pencil eraser.  Always use sunscreen. Apply sunscreen liberally and repeatedly throughout the day.  Protect yourself by wearing long sleeves, pants, a wide-brimmed hat, and sunglasses whenever you are outside.  Heart disease, diabetes, and high blood pressure  High blood pressure causes heart disease and increases the risk of stroke. High blood pressure is more likely to develop in: ? People who have blood pressure in the high end of the normal range (130-139/85-89 mm Hg). ? People who are overweight or obese. ? People who are African American.  If you are 9-23 years of age, have your blood pressure checked every 3-5  years. If you are 58 years of age or older, have your blood pressure checked every year. You should have your blood pressure measured twice-once when you are at a hospital or clinic, and once when you are not at a hospital or clinic. Record the average of the two measurements. To check your blood pressure when you are not at a hospital or clinic, you can use: ? An automated blood pressure machine at a pharmacy. ? A home blood pressure monitor.  If you are between 31 years and 26 years old, ask your health care provider if you should take aspirin to prevent strokes.  Have regular diabetes screenings. This involves taking a blood sample to check your fasting blood sugar level. ? If you are at a normal weight and have a low risk for diabetes, have this test once every three years after 63 years of age. ? If you are overweight and have a high risk for diabetes, consider being tested at a younger age or more often. Preventing infection Hepatitis B  If you have a higher risk for hepatitis B, you should be screened for this virus. You are considered at high risk for hepatitis B if: ? You were born in a country where hepatitis B is common. Ask your health care provider which countries are considered high risk. ? Your parents were born in a high-risk country, and you have not been immunized against hepatitis B (hepatitis B vaccine). ? You have HIV or AIDS. ? You use needles to inject street drugs. ? You live with someone who has hepatitis B. ? You have had sex with someone who has hepatitis B. ? You get hemodialysis treatment. ? You take certain medicines for conditions, including cancer, organ transplantation, and autoimmune conditions.  Hepatitis C  Blood testing is recommended for: ? Everyone born from [birth year] through 1966. ? Anyone with known risk factors for hepatitis C.  Sexually transmitted infections (STIs)  You should be screened for sexually transmitted infections (STIs) including  gonorrhea and chlamydia if: ? You are sexually active and are younger than 64 years of age. ? You are older than 63 years of age and your health care provider tells you that you are at risk for this type of infection. ? Your sexual activity has changed since you were last screened and you are at an increased risk for chlamydia or gonorrhea. Ask your health care provider if you are at risk.  If you do not have HIV, but are at risk, it may be recommended that you take a prescription medicine  daily to prevent HIV infection. This is called pre-exposure prophylaxis (PrEP). You are considered at risk if: ? You are sexually active and do not regularly use condoms or know the HIV status of your partner(s). ? You take drugs by injection. ? You are sexually active with a partner who has HIV.  Talk with your health care provider about whether you are at high risk of being infected with HIV. If you choose to begin PrEP, you should first be tested for HIV. You should then be tested every 3 months for as long as you are taking PrEP. Pregnancy  If you are premenopausal and you may become pregnant, ask your health care provider about preconception counseling.  If you may become pregnant, take 400 to 800 micrograms (mcg) of folic acid every day.  If you want to prevent pregnancy, talk to your health care provider about birth control (contraception). Osteoporosis and menopause  Osteoporosis is a disease in which the bones lose minerals and strength with aging. This can result in serious bone fractures. Your risk for osteoporosis can be identified using a bone density scan.  If you are 24 years of age or older, or if you are at risk for osteoporosis and fractures, ask your health care provider if you should be screened.  Ask your health care provider whether you should take a calcium or vitamin D supplement to lower your risk for osteoporosis.  Menopause may have certain physical symptoms and  risks.  Hormone replacement therapy may reduce some of these symptoms and risks. Talk to your health care provider about whether hormone replacement therapy is right for you. Follow these instructions at home:  Schedule regular health, dental, and eye exams.  Stay current with your immunizations.  Do not use any tobacco products including cigarettes, chewing tobacco, or electronic cigarettes.  If you are pregnant, do not drink alcohol.  If you are breastfeeding, limit how much and how often you drink alcohol.  Limit alcohol intake to no more than 1 drink per day for nonpregnant women. One drink equals 12 ounces of beer, 5 ounces of wine, or 1 ounces of hard liquor.  Do not use street drugs.  Do not share needles.  Ask your health care provider for help if you need support or information about quitting drugs.  Tell your health care provider if you often feel depressed.  Tell your health care provider if you have ever been abused or do not feel safe at home. This information is not intended to replace advice given to you by your health care provider. Make sure you discuss any questions you have with your health care provider. Document Released: 12/28/2010 Document Revised: 11/20/2015 Document Reviewed: 03/18/2015 Elsevier Interactive Patient Education  [employer].

## 2017-11-15 ENCOUNTER — Other Ambulatory Visit (INDEPENDENT_AMBULATORY_CARE_PROVIDER_SITE_OTHER): Payer: BLUE CROSS/BLUE SHIELD

## 2017-11-15 ENCOUNTER — Ambulatory Visit (INDEPENDENT_AMBULATORY_CARE_PROVIDER_SITE_OTHER): Payer: BLUE CROSS/BLUE SHIELD | Admitting: Internal Medicine

## 2017-11-15 ENCOUNTER — Encounter: Payer: Self-pay | Admitting: Internal Medicine

## 2017-11-15 VITALS — BP 150/78 | HR 84 | Temp 98.7°F | Resp 16 | Ht 63.0 in | Wt 217.0 lb

## 2017-11-15 DIAGNOSIS — K219 Gastro-esophageal reflux disease without esophagitis: Secondary | ICD-10-CM

## 2017-11-15 DIAGNOSIS — E782 Mixed hyperlipidemia: Secondary | ICD-10-CM

## 2017-11-15 DIAGNOSIS — I10 Essential (primary) hypertension: Secondary | ICD-10-CM

## 2017-11-15 DIAGNOSIS — R7303 Prediabetes: Secondary | ICD-10-CM | POA: Diagnosis not present

## 2017-11-15 DIAGNOSIS — Z0000 Encounter for general adult medical examination without abnormal findings: Secondary | ICD-10-CM

## 2017-11-15 DIAGNOSIS — E669 Obesity, unspecified: Secondary | ICD-10-CM | POA: Diagnosis not present

## 2017-11-15 LAB — COMPREHENSIVE METABOLIC PANEL
ALBUMIN: 4.3 g/dL (ref 3.5–5.2)
ALT: 27 U/L (ref 0–35)
AST: 23 U/L (ref 0–37)
Alkaline Phosphatase: 86 U/L (ref 39–117)
BUN: 12 mg/dL (ref 6–23)
CALCIUM: 9.6 mg/dL (ref 8.4–10.5)
CHLORIDE: 104 meq/L (ref 96–112)
CO2: 29 meq/L (ref 19–32)
CREATININE: 0.99 mg/dL (ref 0.40–1.20)
GFR: 72.9 mL/min (ref >60.00)
Glucose, Bld: 106 mg/dL — ABNORMAL HIGH (ref 70–99)
POTASSIUM: 4.2 meq/L (ref 3.5–5.1)
Sodium: 141 mEq/L (ref 135–145)
Total Bilirubin: 0.8 mg/dL (ref 0.2–1.2)
Total Protein: 7.2 g/dL (ref 6.0–8.3)

## 2017-11-15 LAB — CBC WITH DIFFERENTIAL/PLATELET
BASOS PCT: 0.8 % (ref 0.0–3.0)
Basophils Absolute: 0 10*3/uL (ref 0.0–0.1)
EOS ABS: 0.1 10*3/uL (ref 0.0–0.7)
EOS PCT: 2.1 % (ref 0.0–5.0)
HCT: 40.4 % (ref 36.0–46.0)
HEMOGLOBIN: 13.5 g/dL (ref 12.0–15.0)
LYMPHS PCT: 49.1 % — AB (ref 12.0–46.0)
Lymphs Abs: 2.6 10*3/uL (ref 0.7–4.0)
MCHC: 33.4 g/dL (ref 30.0–36.0)
MCV: 91.7 fl (ref 78.0–100.0)
MONOS PCT: 7.8 % (ref 3.0–12.0)
Monocytes Absolute: 0.4 10*3/uL (ref 0.1–1.0)
NEUTROS ABS: 2.2 10*3/uL (ref 1.4–7.7)
Neutrophils Relative %: 40.2 % — ABNORMAL LOW (ref 43.0–77.0)
PLATELETS: 223 10*3/uL (ref 150.0–400.0)
RBC: 4.4 Mil/uL (ref 3.87–5.11)
RDW: 13.7 % (ref 11.5–15.5)
WBC: 5.4 10*3/uL (ref 4.0–10.5)

## 2017-11-15 LAB — TSH: TSH: 0.94 u[IU]/mL (ref 0.35–4.50)

## 2017-11-15 LAB — LIPID PANEL
CHOLESTEROL: 224 mg/dL — AB (ref 0–200)
HDL: 60.1 mg/dL (ref >39.00)
LDL Cholesterol: 141 mg/dL — ABNORMAL HIGH (ref 0–99)
NonHDL: 163.96
Total CHOL/HDL Ratio: 4
Triglycerides: 116 mg/dL (ref 0.0–149.0)
VLDL: 23.2 mg/dL (ref 0.0–40.0)

## 2017-11-15 LAB — HEMOGLOBIN A1C: Hgb A1c MFr Bld: 5.8 % (ref 4.6–6.5)

## 2017-11-15 MED ORDER — DICLOFENAC SODIUM 1 % TD GEL: 4.0000 g | Freq: Four times a day (QID) | TRANSDERMAL | 8 refills | Status: DC

## 2017-11-15 MED ORDER — LISINOPRIL-HYDROCHLOROTHIAZIDE 20-12.5 MG PO TABS: 1.0000 | ORAL_TABLET | Freq: Every day | ORAL | 3 refills | Status: DC

## 2017-11-15 MED ORDER — PHENTERMINE HCL 30 MG PO CAPS
30.0000 mg | ORAL_CAPSULE | ORAL | 0 refills | Status: DC
Start: 2017-11-15 — End: 2019-02-01

## 2017-11-15 NOTE — Assessment & Plan Note (Signed)
Check a1c Low sugar / carb diet Stressed regular exercise, weight loss  

## 2017-11-15 NOTE — Assessment & Plan Note (Addendum)
BP at home is 130's consistently BP well controlled Current regimen effective and well tolerated Continue current medications at current doses cmp

## 2017-11-15 NOTE — Assessment & Plan Note (Signed)
Discussed dietary changes and starting regular exercise - her life has been incredibly busy with working full time and school - finally she is done and will start to make lifestyle changes She would like something to help temporarily -- will prescribe phentermine and she will monitor her BP at home regularly  Discussed possible side effects - max of 3 months Will work with nutritionist with [employer]

## 2017-11-15 NOTE — Assessment & Plan Note (Signed)
Not on medication Check lipid panel Wants to work on lifestyle changes F/u in 6 months

## 2017-11-15 NOTE — Assessment & Plan Note (Signed)
Occasional GERD Takes zantac as needed

## 2017-11-16 ENCOUNTER — Encounter: Payer: Self-pay | Admitting: Internal Medicine

## 2017-12-27 ENCOUNTER — Encounter: Payer: Self-pay | Admitting: Internal Medicine

## 2017-12-27 DIAGNOSIS — D124 Benign neoplasm of descending colon: Secondary | ICD-10-CM | POA: Diagnosis not present

## 2017-12-27 DIAGNOSIS — D123 Benign neoplasm of transverse colon: Secondary | ICD-10-CM | POA: Diagnosis not present

## 2017-12-27 MED ORDER — DILTIAZEM HCL ER COATED BEADS 240 MG PO CP24: 240.0000 mg | ORAL_CAPSULE | Freq: Every day | ORAL | 2 refills | Status: DC

## 2017-12-30 ENCOUNTER — Encounter: Payer: Self-pay | Admitting: Internal Medicine

## 2018-01-02 DIAGNOSIS — H35033 Hypertensive retinopathy, bilateral: Secondary | ICD-10-CM | POA: Diagnosis not present

## 2018-02-06 ENCOUNTER — Encounter: Payer: Self-pay | Admitting: Internal Medicine

## 2018-04-04 ENCOUNTER — Encounter: Payer: Self-pay | Admitting: Internal Medicine

## 2018-04-05 ENCOUNTER — Encounter: Payer: Self-pay | Admitting: Internal Medicine

## 2018-05-09 ENCOUNTER — Ambulatory Visit (INDEPENDENT_AMBULATORY_CARE_PROVIDER_SITE_OTHER): Payer: BLUE CROSS/BLUE SHIELD | Admitting: *Deleted

## 2018-05-09 DIAGNOSIS — Z23 Encounter for immunization: Secondary | ICD-10-CM | POA: Diagnosis not present

## 2018-05-26 ENCOUNTER — Other Ambulatory Visit: Payer: Self-pay | Admitting: Internal Medicine

## 2018-05-31 ENCOUNTER — Other Ambulatory Visit: Payer: Self-pay | Admitting: Internal Medicine

## 2018-05-31 DIAGNOSIS — Z1231 Encounter for screening mammogram for malignant neoplasm of breast: Secondary | ICD-10-CM

## 2018-06-01 ENCOUNTER — Ambulatory Visit
Admission: RE | Admit: 2018-06-01 | Discharge: 2018-06-01 | Disposition: A | Discharge status: Home / Self Care | Payer: BLUE CROSS/BLUE SHIELD | Source: Ambulatory Visit

## 2018-06-01 DIAGNOSIS — Z1231 Encounter for screening mammogram for malignant neoplasm of breast: Secondary | ICD-10-CM

## 2018-06-01 IMAGING — MG DIGITAL SCREENING BILATERAL MAMMOGRAM WITH TOMO AND CAD
8 series · 8 of 24 positions shown · non-contrast
Comparison: Previous exam(s).

CLINICAL DATA: Screening.

EXAM:
DIGITAL SCREENING BILATERAL MAMMOGRAM WITH TOMO AND CAD

[R CC synth-2D]
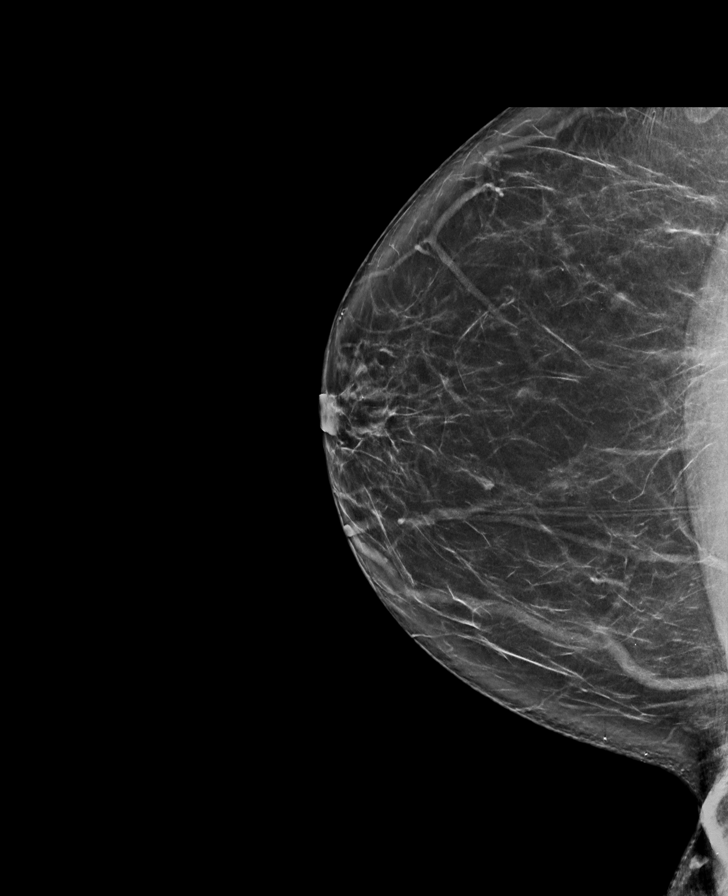

[L MLO synth-2D]
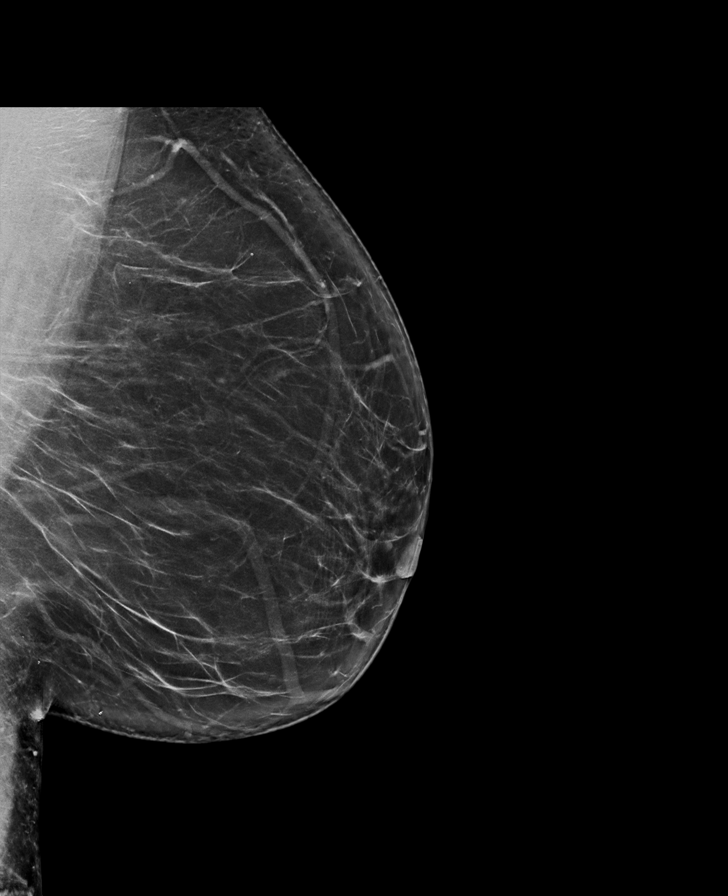

[L CC synth-2D]
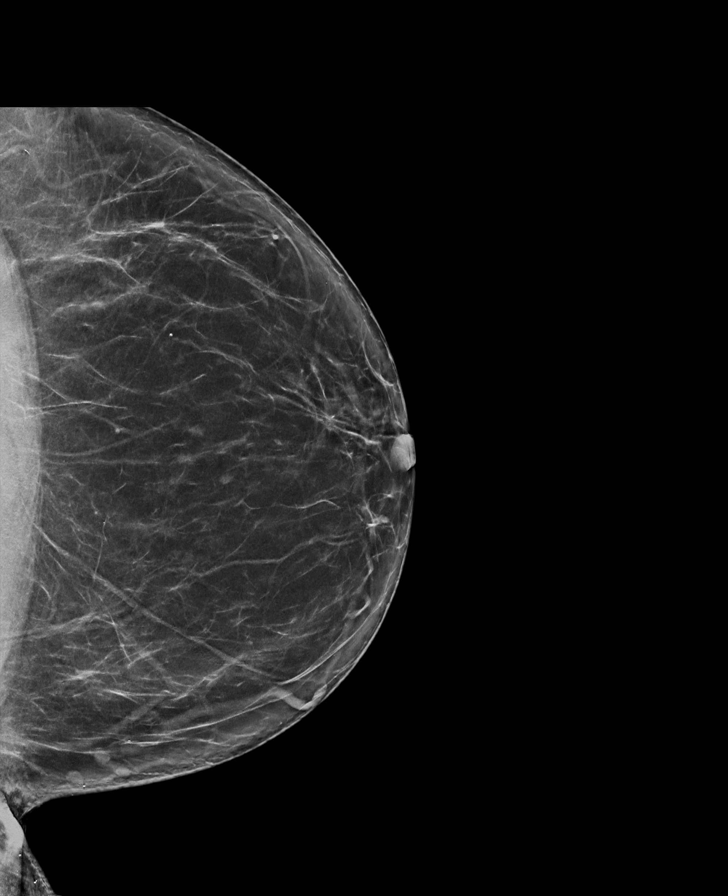

[R MLO synth-2D]
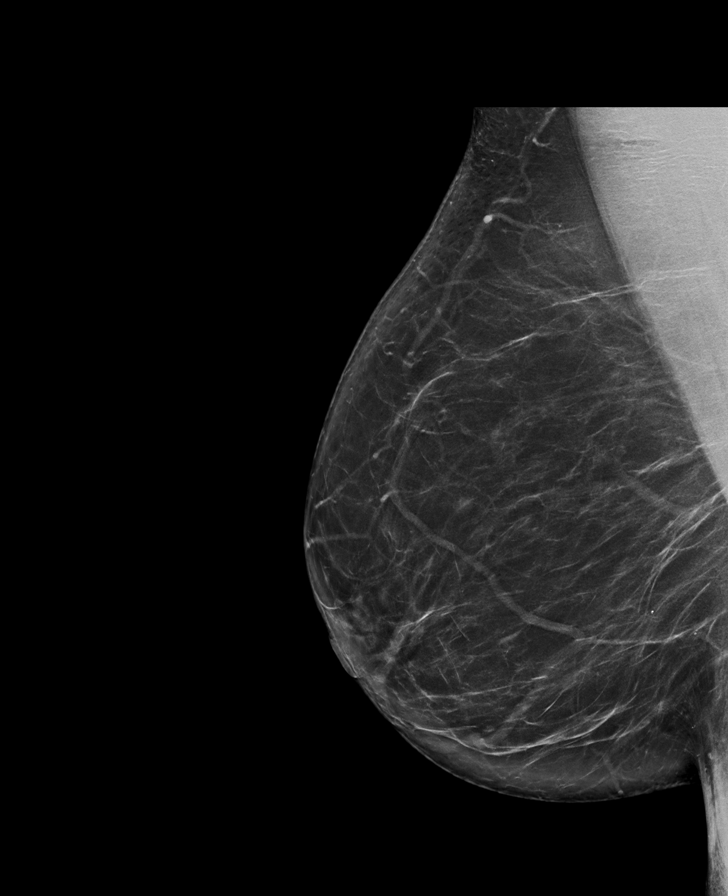

[L CC tomo · tomo slice 43/86.0]
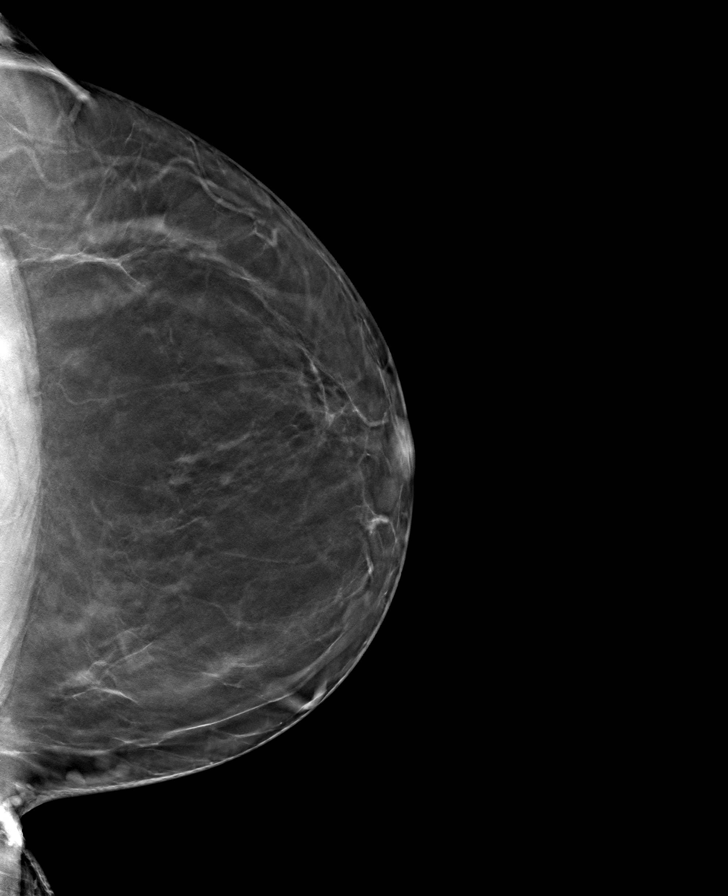

[R MLO tomo · tomo slice 49/97.0]
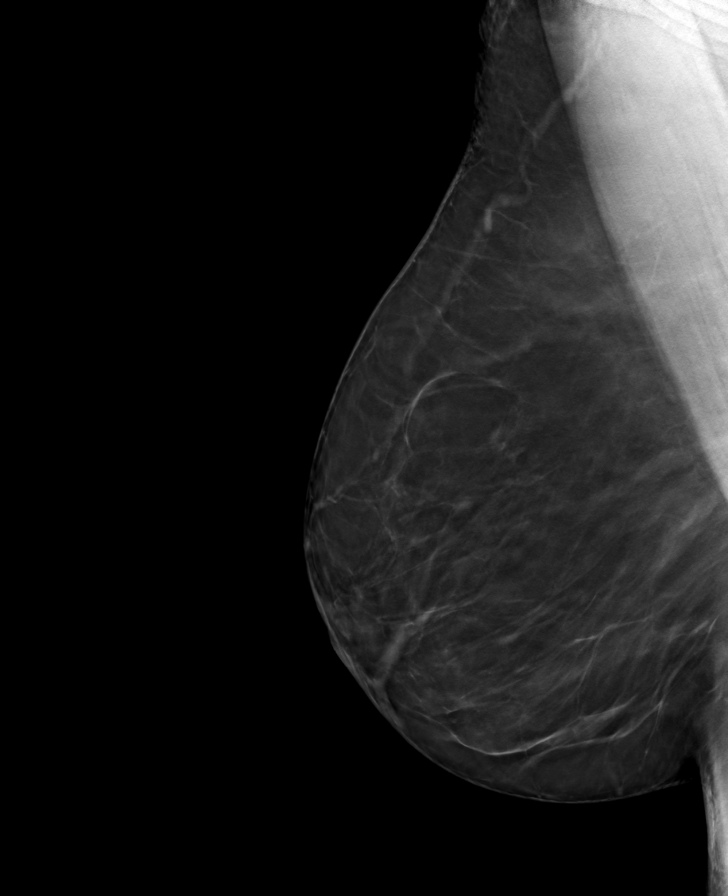

[L MLO tomo · tomo slice 47/93.0]
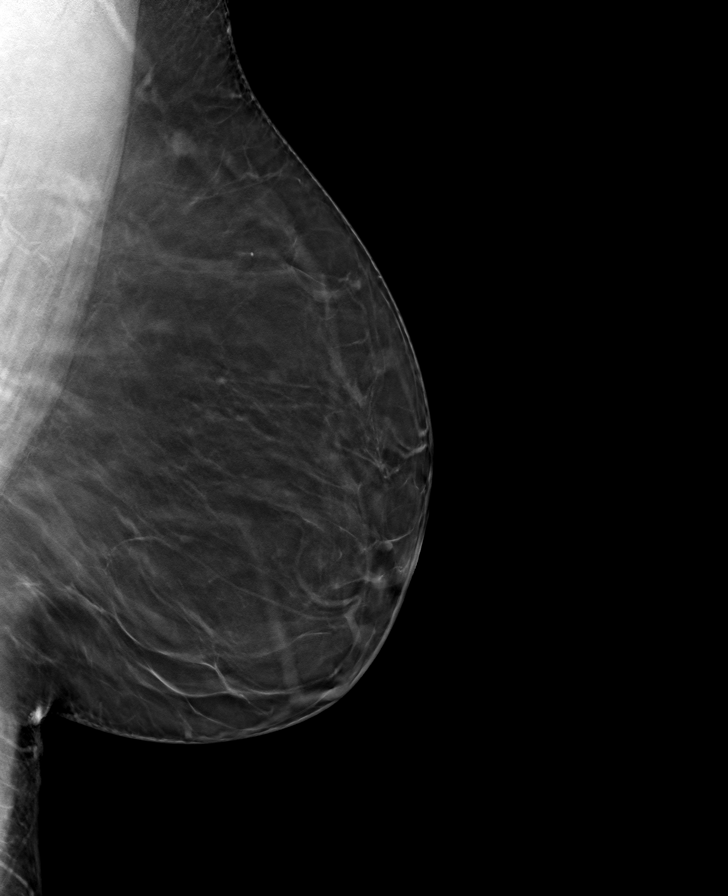

[R CC tomo · tomo slice 43/86.0]
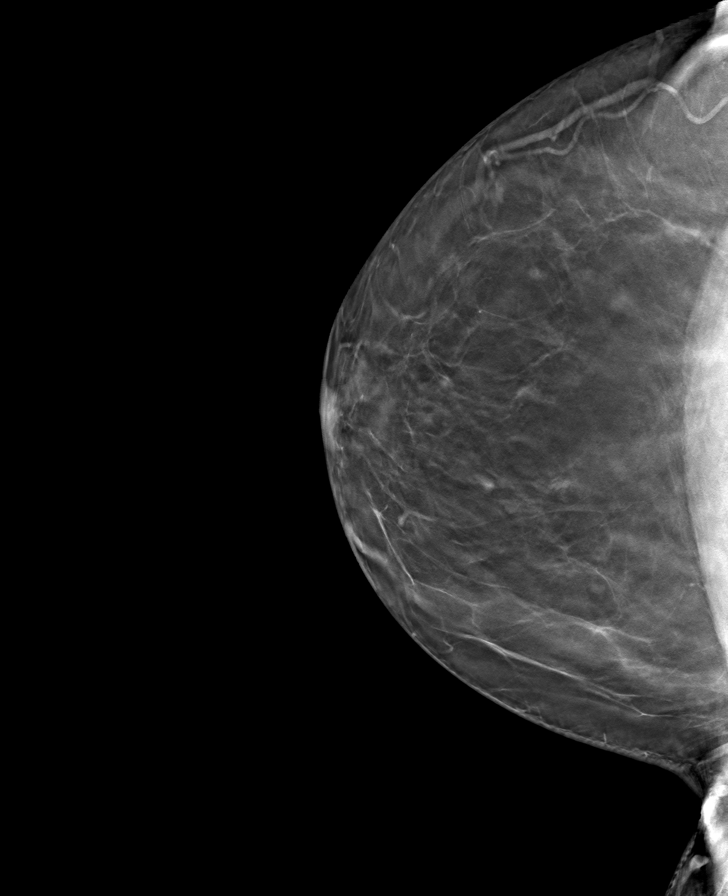

[8 of 24 positions shown; findings below may reference images not displayed]

ACR Breast Density Category b: There are scattered areas of
fibroglandular density.
FINDINGS: There are no findings suspicious for malignancy. Images were
processed with CAD.
IMPRESSION: No mammographic evidence of malignancy. A result letter of this
screening mammogram will be mailed directly to the patient.

RECOMMENDATION:
Screening mammogram in one year. (Code:[TQ])

BI-RADS CATEGORY  1: Negative.

## 2018-09-22 ENCOUNTER — Other Ambulatory Visit: Payer: Self-pay | Admitting: Internal Medicine

## 2018-10-25 ENCOUNTER — Telehealth: Payer: Self-pay | Admitting: Internal Medicine

## 2018-10-25 MED ORDER — DILTIAZEM HCL ER COATED BEADS 240 MG PO CP24: 240.0000 mg | ORAL_CAPSULE | Freq: Every day | ORAL | 0 refills | Status: DC

## 2018-10-25 NOTE — Telephone Encounter (Signed)
Patient was due for a follow up back in November. Patient needs to schedule some sort of follow up. Preferably a virtual

## 2018-10-25 NOTE — Telephone Encounter (Signed)
Patient would like a 90 days supply of this medication. Please advise, CPE set up in August

## 2018-10-26 NOTE — Telephone Encounter (Signed)
MyChart message was sent to patient.

## 2018-11-24 ENCOUNTER — Other Ambulatory Visit: Payer: Self-pay | Admitting: Internal Medicine

## 2018-11-24 MED ORDER — DILTIAZEM HCL ER COATED BEADS 240 MG PO CP24: 240.0000 mg | ORAL_CAPSULE | Freq: Every day | ORAL | 0 refills | Status: DC

## 2018-11-24 MED ORDER — LISINOPRIL-HYDROCHLOROTHIAZIDE 20-12.5 MG PO TABS: 1.0000 | ORAL_TABLET | Freq: Every day | ORAL | 0 refills | Status: DC

## 2019-01-16 DIAGNOSIS — H35033 Hypertensive retinopathy, bilateral: Secondary | ICD-10-CM | POA: Diagnosis not present

## 2019-02-01 ENCOUNTER — Encounter: Payer: Self-pay | Admitting: Internal Medicine

## 2019-02-01 NOTE — Progress Notes (Signed)
Subjective:    Patient ID: Tami Hughes, female    DOB: [birth year], 63 y.o.   MRN: [id number]  HPI She is here for a physical exam.   She does check her blood pressure at home and the highest she has seen is 142/98.  She denies any changes in her health since she was here last and has no major concerns.  She is working from home and when the pandemic first started she did gain weight that she had lost.  She is now working on losing that weight.  She is back to exercising regularly and is trying to eat better.  Medications and allergies reviewed with patient and updated if appropriate.  Patient Active Problem List   Diagnosis Date Noted  . Prediabetes 11/11/2016  . Herpes simplex without mention of complication 64/33/2951  . Menopause syndrome 08/21/2012  . Obesity, Class II, BMI 35-39.9 05/02/2012  . Personal history of colonic polyps 01/27/2012  . VENOUS INSUFFICIENCY, LEGS 08/28/2010  . Hyperlipidemia 02/22/2008  . Essential hypertension 02/22/2008  . GERD 02/22/2008    Current Outpatient Medications on File Prior to Visit  Medication Sig Dispense Refill  . diltiazem (CARTIA XT) 240 MG 24 hr capsule Take 1 capsule (240 mg total) by mouth daily. 90 capsule 0  . lisinopril-hydrochlorothiazide (ZESTORETIC) 20-12.5 MG tablet Take 1 tablet by mouth daily. 90 tablet 0   No current facility-administered medications on file prior to visit.     Past Medical History:  Diagnosis Date  . Allergy   . Arthritis   . Dyslipidemia   . GERD (gastroesophageal reflux disease)   . Hyperlipidemia   . Hypertension   . Polyp of colon 2009   adenoma benign  . Shingles    recurrent: 08/2010, 10/2013    Past Surgical History:  Procedure Laterality Date  . ABDOMINAL HYSTERECTOMY     TAH  . CHOLECYSTECTOMY N/A 12/18/2014   Procedure: LAPAROSCOPIC CHOLECYSTECTOMY WITH INTRAOPERATIVE CHOLANGIOGRAM;  Surgeon: Johnathan Hausen, MD;  Location: [facility];  Service: General;  Laterality: N/A;  .  COLONOSCOPY    . LABIOPLASTY    . POLYPECTOMY    . PUBOVAGINAL SLING  05/08/2010   stress urinary incontinence  . VULVECTOMY     partial vulvectomy secondary to labial hypertrophy..    Social History   Socioeconomic History  . Marital status: Widowed    Spouse name: Not on file  . Number of children: 2  . Years of education: 57  . Highest education level: Not on file  Occupational History  . Occupation: Office manager: [employer]  Social Needs  . Financial resource strain: Not on file  . Food insecurity    Worry: Not on file    Inability: Not on file  . Transportation needs    Medical: Not on file    Non-medical: Not on file  Tobacco Use  . Smoking status: Never Smoker  . Smokeless tobacco: Never Used  Substance and Sexual Activity  . Alcohol use: Yes    Alcohol/week: 2.0 standard drinks    Types: 2 Standard drinks or equivalent per week    Comment: wine on weekends  . Drug use: No  . Sexual activity: Yes    Partners: Male  Lifestyle  . Physical activity    Days per week: Not on file    Minutes per session: Not on file  . Stress: Not on file  Relationships  . Social Herbalist on phone:  Not on file    Gets together: Not on file    Attends religious service: Not on file    Active member of club or organization: Not on file    Attends meetings of clubs or organizations: Not on file    Relationship status: Not on file  Other Topics Concern  . Not on file  Social History Narrative   HSG, [employer] - BA, [employer] - masters in Energy manager. Married '74 - 27 yrs/widowed. 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 Has high potential for work but will finish master's first. Lives alone.       CPA   Widow, single   2 children, 2 grandchildren   No regular exercise   1 large cup of coffee daily    Family History  Problem Relation Age of Onset  . Kidney disease Mother        had transplant  . Breast cancer  Mother   . Heart disease Sister   . Heart attack Sister   . Colon cancer Neg Hx   . Esophageal cancer Neg Hx   . Rectal cancer Neg Hx   . Stomach cancer Neg Hx   . Diabetes Neg Hx   . Hyperlipidemia Neg Hx   . COPD Neg Hx   . Colon polyps Neg Hx     Review of Systems  Constitutional: Negative for chills and fever.  Eyes: Negative for visual disturbance.  Respiratory: Negative for cough, shortness of breath and wheezing.   Cardiovascular: Negative for chest pain, palpitations and leg swelling.  Gastrointestinal: Negative for abdominal pain, blood in stool, constipation, diarrhea and nausea.       GERD - takes nexium prn  Genitourinary: Negative for dysuria and hematuria.  Musculoskeletal: Positive for arthralgias (arthritis - tylenol). Negative for back pain.  Skin: Positive for color change (moles, dry patch on butt). Negative for rash.  Neurological: Negative for dizziness, light-headedness, numbness and headaches.  Psychiatric/Behavioral: Negative for dysphoric mood. The patient is not nervous/anxious.        Objective:   Vitals:   02/02/19 0803  BP: (!) 142/80  Pulse: 80  Resp: 16  Temp: 98.1 F (36.7 C)  SpO2: 96%   Filed Weights   02/02/19 0803  Weight: 219 lb 12.8 oz (99.7 kg)   Body mass index is 38.94 kg/m.  BP Readings from Last 3 Encounters:  02/02/19 (!) 142/80  11/15/17 (!) 150/78  05/25/17 (!) 145/79    Wt Readings from Last 3 Encounters:  02/02/19 219 lb 12.8 oz (99.7 kg)  11/15/17 217 lb (98.4 kg)  05/25/17 224 lb (101.6 kg)     Physical Exam Constitutional: She appears well-developed and well-nourished. No distress.  HENT:  Head: Normocephalic and atraumatic.  Right Ear: External ear normal. Normal ear canal and TM Left Ear: External ear normal.  Normal ear canal and TM Mouth/Throat: Oropharynx is clear and moist.  Eyes: Conjunctivae and EOM are normal.  Neck: Neck supple. No tracheal deviation present. No thyromegaly present.  No  carotid bruit  Cardiovascular: Normal rate, regular rhythm and normal heart sounds.   No murmur heard.  No edema. Pulmonary/Chest: Effort normal and breath sounds normal. No respiratory distress. She has no wheezes. She has no rales.  Breast: deferred   Abdominal: Soft. She exhibits no distension. There is no tenderness.  Lymphadenopathy: She has no cervical adenopathy.  Skin: Skin is warm and dry. She is not diaphoretic.  Psychiatric:  She has a normal mood and affect. Her behavior is normal.        Assessment & Plan:   Physical exam: Screening blood work ordered Immunizations   discussed shingrix, others up to date Colonoscopy   Up to date  Mammogram   Up to date  Gyn  Up to date - s/p hysterectomy Dexa  Normal at 32 - repeat at 72 Eye exams   Up to date  Exercise   regular Weight   Advised weight loss-she is currently working on that Skin   would like a couple moles removed.  Also has a dry patch on her buttock region-would like referral to dermatology Substance abuse   none  See Problem List for Assessment and Plan of chronic medical problems.   Follow-up in 6 months

## 2019-02-01 NOTE — Patient Instructions (Addendum)
BP goal -  <142/90   Tests ordered today. Your results will be released to [place] (or called to you) after review.  If any changes need to be made, you will be notified at that same time.  All other Health Maintenance issues reviewed.   All recommended immunizations and age-appropriate screenings are up-to-date or discussed.  No immunization administered today.   Medications reviewed and updated.  Changes include :   none  Your prescription(s) have been submitted to your pharmacy. Please take as directed and contact our office if you believe you are having problem(s) with the medication(s).  A referral for derm was ordered.  Please followup in 6 months    Health Maintenance, Female Adopting a healthy lifestyle and getting preventive care are important in promoting health and wellness. Ask your health care provider about:  The right schedule for you to have regular tests and exams.  Things you can do on your own to prevent diseases and keep yourself healthy. What should I know about diet, weight, and exercise? Eat a healthy diet   Eat a diet that includes plenty of vegetables, fruits, low-fat dairy products, and lean protein.  Do not eat a lot of foods that are high in solid fats, added sugars, or sodium. Maintain a healthy weight Body mass index (BMI) is used to identify weight problems. It estimates body fat based on height and weight. Your health care provider can help determine your BMI and help you achieve or maintain a healthy weight. Get regular exercise Get regular exercise. This is one of the most important things you can do for your health. Most adults should:  Exercise for at least 150 minutes each week. The exercise should increase your heart rate and make you sweat (moderate-intensity exercise).  Do strengthening exercises at least twice a week. This is in addition to the moderate-intensity exercise.  Spend less time sitting. Even light physical activity can be  beneficial. Watch cholesterol and blood lipids Have your blood tested for lipids and cholesterol at 64 years of age, then have this test every 5 years. Have your cholesterol levels checked more often if:  Your lipid or cholesterol levels are high.  You are older than 64 years of age.  You are at high risk for heart disease. What should I know about cancer screening? Depending on your health history and family history, you may need to have cancer screening at various ages. This may include screening for:  Breast cancer.  Cervical cancer.  Colorectal cancer.  Skin cancer.  Lung cancer. What should I know about heart disease, diabetes, and high blood pressure? Blood pressure and heart disease  High blood pressure causes heart disease and increases the risk of stroke. This is more likely to develop in people who have high blood pressure readings, are of African descent, or are overweight.  Have your blood pressure checked: ? Every 3-5 years if you are 71-64 years of age. ? Every year if you are 42 years old or older. Diabetes Have regular diabetes screenings. This checks your fasting blood sugar level. Have the screening done:  Once every three years after age 31 if you are at a normal weight and have a low risk for diabetes.  More often and at a younger age if you are overweight or have a high risk for diabetes. What should I know about preventing infection? Hepatitis B If you have a higher risk for hepatitis B, you should be screened for this virus. Talk  with your health care provider to find out if you are at risk for hepatitis B infection. Hepatitis C Testing is recommended for:  Everyone born from [birth year] through 1966.  Anyone with known risk factors for hepatitis C. Sexually transmitted infections (STIs)  Get screened for STIs, including gonorrhea and chlamydia, if: ? You are sexually active and are younger than 64 years of age. ? You are older than 64 years of age and  your health care provider tells you that you are at risk for this type of infection. ? Your sexual activity has changed since you were last screened, and you are at increased risk for chlamydia or gonorrhea. Ask your health care provider if you are at risk.  Ask your health care provider about whether you are at high risk for HIV. Your health care provider may recommend a prescription medicine to help prevent HIV infection. If you choose to take medicine to prevent HIV, you should first get tested for HIV. You should then be tested every 3 months for as long as you are taking the medicine. Pregnancy  If you are about to stop having your period (premenopausal) and you may become pregnant, seek counseling before you get pregnant.  Take 400 to 800 micrograms (mcg) of folic acid every day if you become pregnant.  Ask for birth control (contraception) if you want to prevent pregnancy. Osteoporosis and menopause Osteoporosis is a disease in which the bones lose minerals and strength with aging. This can result in bone fractures. If you are 20 years old or older, or if you are at risk for osteoporosis and fractures, ask your health care provider if you should:  Be screened for bone loss.  Take a calcium or vitamin D supplement to lower your risk of fractures.  Be given hormone replacement therapy (HRT) to treat symptoms of menopause. Follow these instructions at home: Lifestyle  Do not use any products that contain nicotine or tobacco, such as cigarettes, e-cigarettes, and chewing tobacco. If you need help quitting, ask your health care provider.  Do not use street drugs.  Do not share needles.  Ask your health care provider for help if you need support or information about quitting drugs. Alcohol use  Do not drink alcohol if: ? Your health care provider tells you not to drink. ? You are pregnant, may be pregnant, or are planning to become pregnant.  If you drink alcohol: ? Limit how  much you use to 0-1 drink a day. ? Limit intake if you are breastfeeding.  Be aware of how much alcohol is in your drink. In the [place] one drink equals one 12 oz bottle of beer (355 mL), one 5 oz glass of wine (148 mL), or one 1 oz glass of hard liquor (44 mL). General instructions  Schedule regular health, dental, and eye exams.  Stay current with your vaccines.  Tell your health care provider if: ? You often feel depressed. ? You have ever been abused or do not feel safe at home. Summary  Adopting a healthy lifestyle and getting preventive care are important in promoting health and wellness.  Follow your health care provider's instructions about healthy diet, exercising, and getting tested or screened for diseases.  Follow your health care provider's instructions on monitoring your cholesterol and blood pressure. This information is not intended to replace advice given to you by your health care provider. Make sure you discuss any questions you have with your health care provider. Document Released: 12/28/2010  Document Revised: 06/07/2018 Document Reviewed: 06/07/2018 Elsevier Patient Education  2020 [employer].

## 2019-02-02 ENCOUNTER — Ambulatory Visit (INDEPENDENT_AMBULATORY_CARE_PROVIDER_SITE_OTHER): Payer: BC Managed Care – PPO | Admitting: Internal Medicine

## 2019-02-02 ENCOUNTER — Other Ambulatory Visit (INDEPENDENT_AMBULATORY_CARE_PROVIDER_SITE_OTHER): Payer: BC Managed Care – PPO

## 2019-02-02 ENCOUNTER — Other Ambulatory Visit: Payer: Self-pay

## 2019-02-02 ENCOUNTER — Encounter: Payer: Self-pay | Admitting: Internal Medicine

## 2019-02-02 VITALS — BP 142/80 | HR 80 | Temp 98.1°F | Resp 16 | Ht 63.0 in | Wt 219.8 lb

## 2019-02-02 DIAGNOSIS — Z0000 Encounter for general adult medical examination without abnormal findings: Secondary | ICD-10-CM

## 2019-02-02 DIAGNOSIS — E669 Obesity, unspecified: Secondary | ICD-10-CM

## 2019-02-02 DIAGNOSIS — R7303 Prediabetes: Secondary | ICD-10-CM | POA: Diagnosis not present

## 2019-02-02 DIAGNOSIS — K219 Gastro-esophageal reflux disease without esophagitis: Secondary | ICD-10-CM

## 2019-02-02 DIAGNOSIS — I10 Essential (primary) hypertension: Secondary | ICD-10-CM | POA: Diagnosis not present

## 2019-02-02 DIAGNOSIS — E782 Mixed hyperlipidemia: Secondary | ICD-10-CM

## 2019-02-02 DIAGNOSIS — D229 Melanocytic nevi, unspecified: Secondary | ICD-10-CM

## 2019-02-02 LAB — COMPREHENSIVE METABOLIC PANEL
ALT: 33 U/L (ref 0–35)
AST: 22 U/L (ref 0–37)
Albumin: 4.5 g/dL (ref 3.5–5.2)
Alkaline Phosphatase: 84 U/L (ref 39–117)
BUN: 16 mg/dL (ref 6–23)
CO2: 30 mEq/L (ref 19–32)
Calcium: 9.8 mg/dL (ref 8.4–10.5)
Chloride: 103 mEq/L (ref 96–112)
Creatinine, Ser: 1.04 mg/dL (ref 0.40–1.20)
GFR: 64.55 mL/min (ref >60.00)
Glucose, Bld: 114 mg/dL — ABNORMAL HIGH (ref 70–99)
Potassium: 4.1 mEq/L (ref 3.5–5.1)
Sodium: 141 mEq/L (ref 135–145)
Total Bilirubin: 0.7 mg/dL (ref 0.2–1.2)
Total Protein: 7.3 g/dL (ref 6.0–8.3)

## 2019-02-02 LAB — LIPID PANEL
Cholesterol: 265 mg/dL — ABNORMAL HIGH (ref 0–200)
HDL: 59.8 mg/dL (ref >39.00)
LDL Cholesterol: 181 mg/dL — ABNORMAL HIGH (ref 0–99)
NonHDL: 205.06
Total CHOL/HDL Ratio: 4
Triglycerides: 120 mg/dL (ref 0.0–149.0)
VLDL: 24 mg/dL (ref 0.0–40.0)

## 2019-02-02 LAB — CBC WITH DIFFERENTIAL/PLATELET
Basophils Absolute: 0 10*3/uL (ref 0.0–0.1)
Basophils Relative: 0.5 % (ref 0.0–3.0)
Eosinophils Absolute: 0.1 10*3/uL (ref 0.0–0.7)
Eosinophils Relative: 1.6 % (ref 0.0–5.0)
HCT: 40.8 % (ref 36.0–46.0)
Hemoglobin: 13.6 g/dL (ref 12.0–15.0)
Lymphocytes Relative: 54.7 % — ABNORMAL HIGH (ref 12.0–46.0)
Lymphs Abs: 3.1 10*3/uL (ref 0.7–4.0)
MCHC: 33.4 g/dL (ref 30.0–36.0)
MCV: 92.5 fl (ref 78.0–100.0)
Monocytes Absolute: 0.4 10*3/uL (ref 0.1–1.0)
Monocytes Relative: 7.7 % (ref 3.0–12.0)
Neutro Abs: 2 10*3/uL (ref 1.4–7.7)
Neutrophils Relative %: 35.5 % — ABNORMAL LOW (ref 43.0–77.0)
Platelets: 220 10*3/uL (ref 150.0–400.0)
RBC: 4.41 Mil/uL (ref 3.87–5.11)
RDW: 13.7 % (ref 11.5–15.5)
WBC: 5.6 10*3/uL (ref 4.0–10.5)

## 2019-02-02 LAB — HEMOGLOBIN A1C: Hgb A1c MFr Bld: 5.8 % (ref 4.6–6.5)

## 2019-02-02 LAB — TSH: TSH: 1.29 u[IU]/mL (ref 0.35–4.50)

## 2019-02-02 MED ORDER — TRIAMCINOLONE ACETONIDE 0.1 % EX CREA: 1.0000 "application " | TOPICAL_CREAM | Freq: Two times a day (BID) | CUTANEOUS | 0 refills | Status: DC

## 2019-02-02 MED ORDER — LISINOPRIL-HYDROCHLOROTHIAZIDE 20-12.5 MG PO TABS: 1.0000 | ORAL_TABLET | Freq: Every day | ORAL | 3 refills | Status: DC

## 2019-02-02 MED ORDER — DILTIAZEM HCL ER COATED BEADS 240 MG PO CP24: 240.0000 mg | ORAL_CAPSULE | Freq: Every day | ORAL | 3 refills | Status: DC

## 2019-02-02 NOTE — Assessment & Plan Note (Signed)
Check a1c Low sugar / carb diet Stressed regular exercise   

## 2019-02-02 NOTE — Assessment & Plan Note (Signed)
Not currently on medication Check lipid panel, CMP, TSH Work on weight loss Continue regular exercise

## 2019-02-02 NOTE — Assessment & Plan Note (Signed)
Blood pressure slightly elevated here today and has been a little elevated at home at times Stressed the importance of good blood pressure control Discussed that if she is able to keep up with regular exercise, continue low-sodium diet and work on weight loss it may help her avoid more medication Monitor BP at home Follow-up in 6 months We will continue current medications at current doses CMP

## 2019-02-02 NOTE — Assessment & Plan Note (Signed)
She did lose weight, but had regained it during the [place] pandemic She is back to exercising regularly and is working on weight loss Stressed the importance of healthy diet and decrease portions

## 2019-02-02 NOTE — Assessment & Plan Note (Signed)
She has intermittent GERD, which she relates to what she eats Takes Nexium over-the-counter as needed Okay to continue Weight loss will improve GERD

## 2019-02-03 ENCOUNTER — Encounter: Payer: Self-pay | Admitting: Internal Medicine

## 2019-02-23 DIAGNOSIS — H40013 Open angle with borderline findings, low risk, bilateral: Secondary | ICD-10-CM | POA: Diagnosis not present

## 2019-03-09 ENCOUNTER — Other Ambulatory Visit: Payer: Self-pay

## 2019-03-09 ENCOUNTER — Ambulatory Visit (INDEPENDENT_AMBULATORY_CARE_PROVIDER_SITE_OTHER): Payer: BC Managed Care – PPO

## 2019-03-09 DIAGNOSIS — Z23 Encounter for immunization: Secondary | ICD-10-CM | POA: Diagnosis not present

## 2019-04-18 ENCOUNTER — Other Ambulatory Visit: Payer: Self-pay | Admitting: Internal Medicine

## 2019-04-18 DIAGNOSIS — Z1231 Encounter for screening mammogram for malignant neoplasm of breast: Secondary | ICD-10-CM

## 2019-04-30 ENCOUNTER — Encounter: Payer: Self-pay | Admitting: Internal Medicine

## 2019-05-03 DIAGNOSIS — B078 Other viral warts: Secondary | ICD-10-CM | POA: Diagnosis not present

## 2019-05-03 DIAGNOSIS — D225 Melanocytic nevi of trunk: Secondary | ICD-10-CM | POA: Diagnosis not present

## 2019-06-05 ENCOUNTER — Ambulatory Visit
Admission: RE | Admit: 2019-06-05 | Discharge: 2019-06-05 | Disposition: A | Discharge status: Home / Self Care | Payer: BC Managed Care – PPO | Source: Ambulatory Visit | Attending: Internal Medicine | Admitting: Internal Medicine

## 2019-06-05 ENCOUNTER — Other Ambulatory Visit: Payer: Self-pay

## 2019-06-05 DIAGNOSIS — Z1231 Encounter for screening mammogram for malignant neoplasm of breast: Secondary | ICD-10-CM

## 2019-06-05 IMAGING — MG DIGITAL SCREENING BILAT W/ TOMO W/ CAD
8 series · 8 of 24 positions shown · non-contrast
Comparison: Previous exam(s).

CLINICAL DATA: Screening.

EXAM:
DIGITAL SCREENING BILATERAL MAMMOGRAM WITH TOMO AND CAD

[R MLO synth-2D]
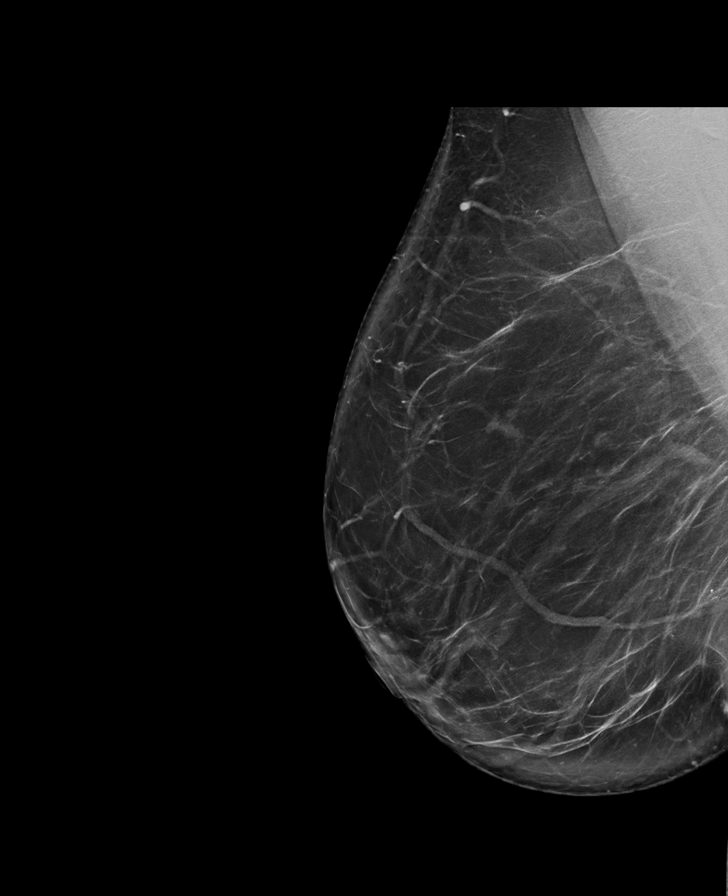

[L CC synth-2D]
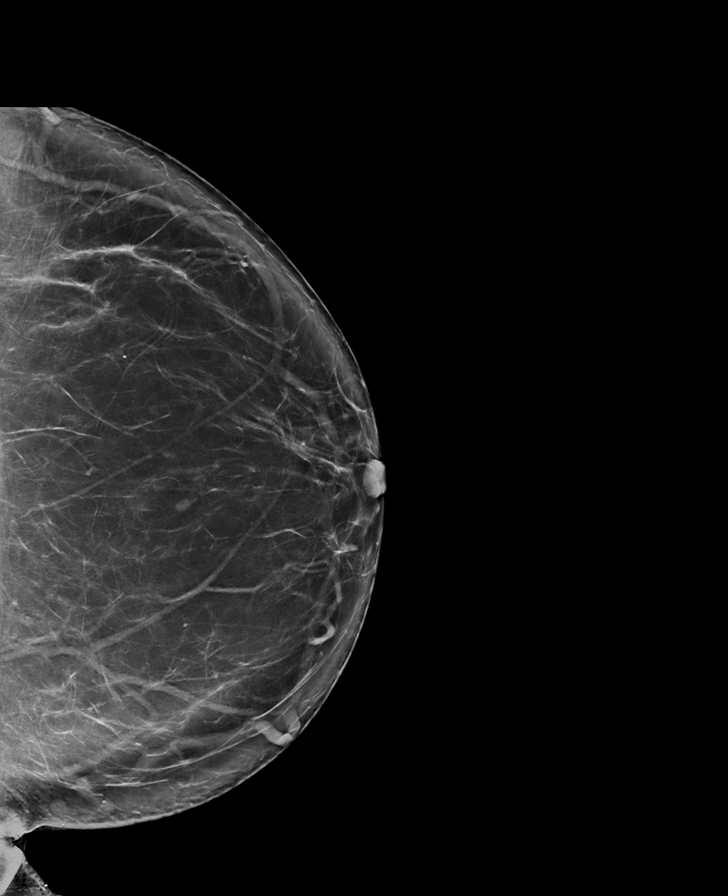

[L MLO synth-2D]
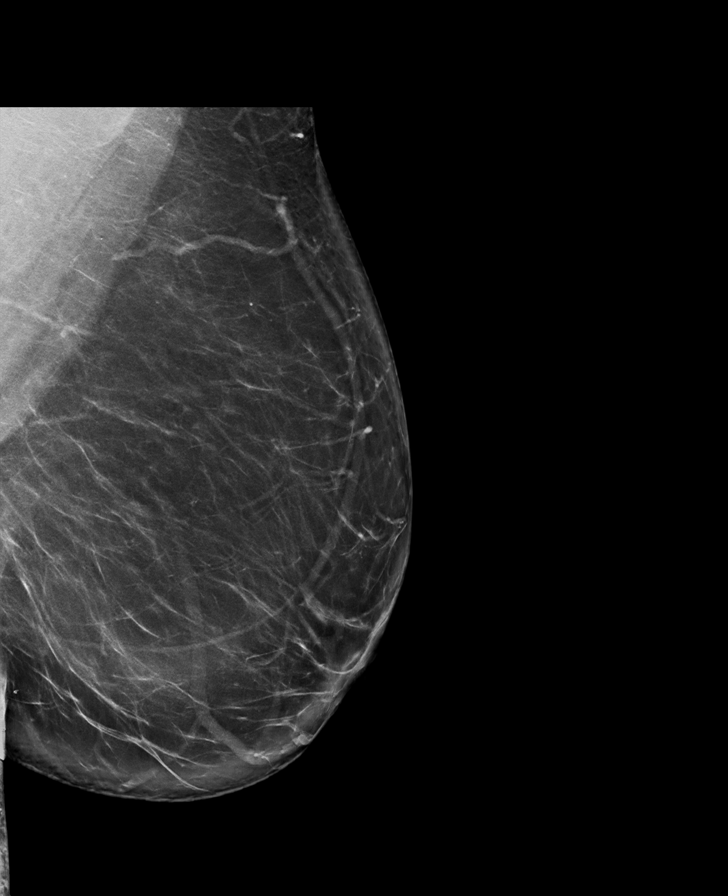

[R CC synth-2D]
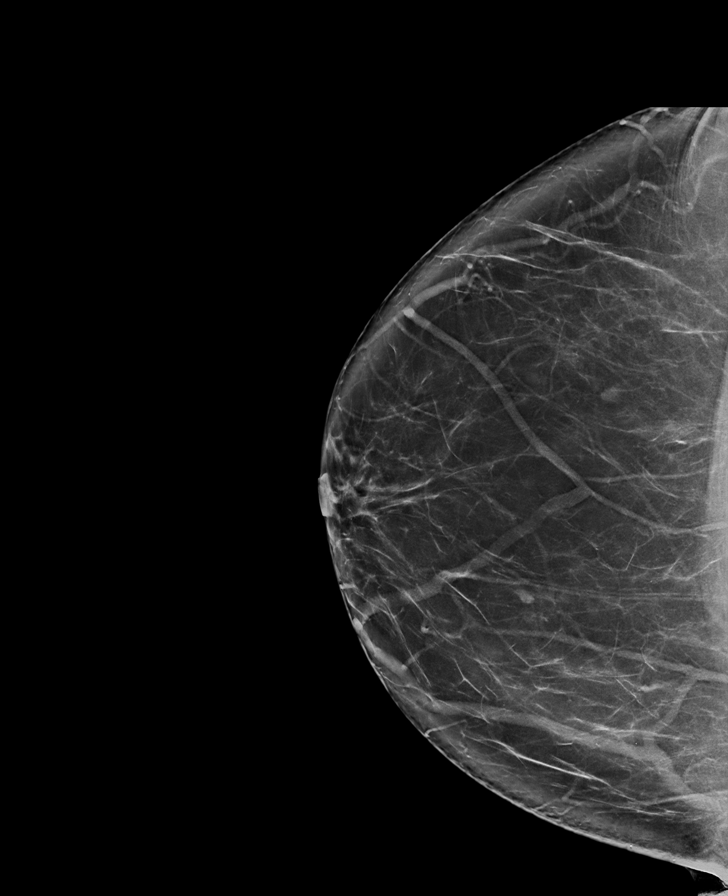

[L CC tomo · tomo slice 48/95.0]
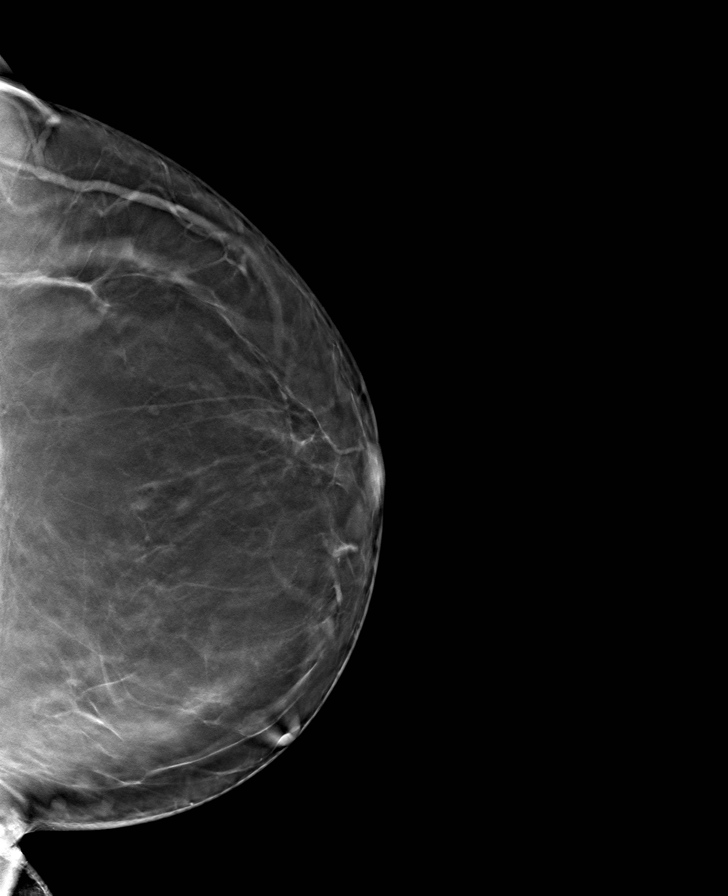

[L MLO tomo · tomo slice 53/104.0]
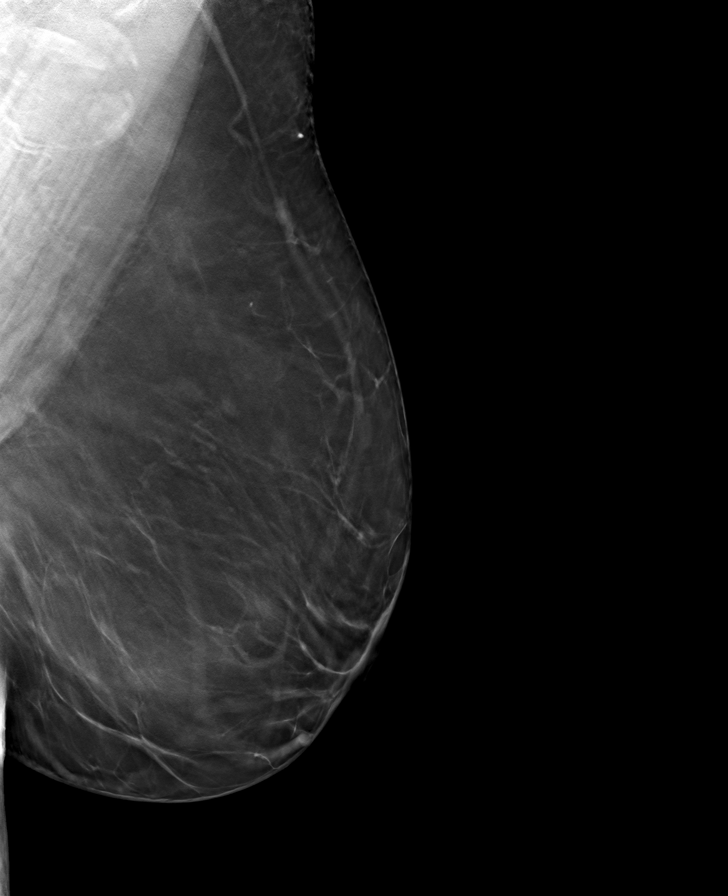

[R MLO tomo · tomo slice 51/101.0]
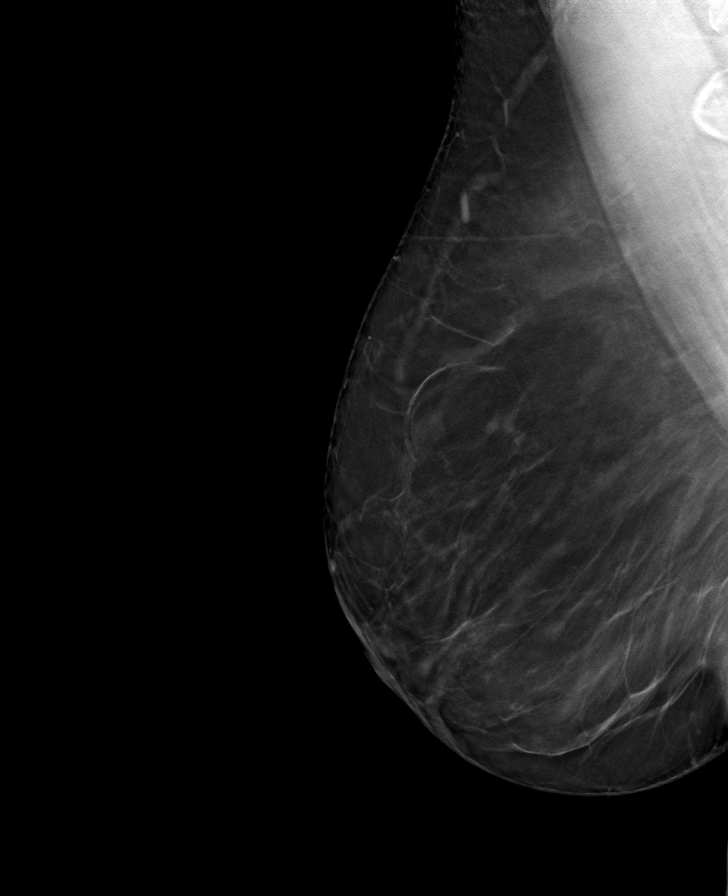

[R CC tomo · tomo slice 46/91.0]
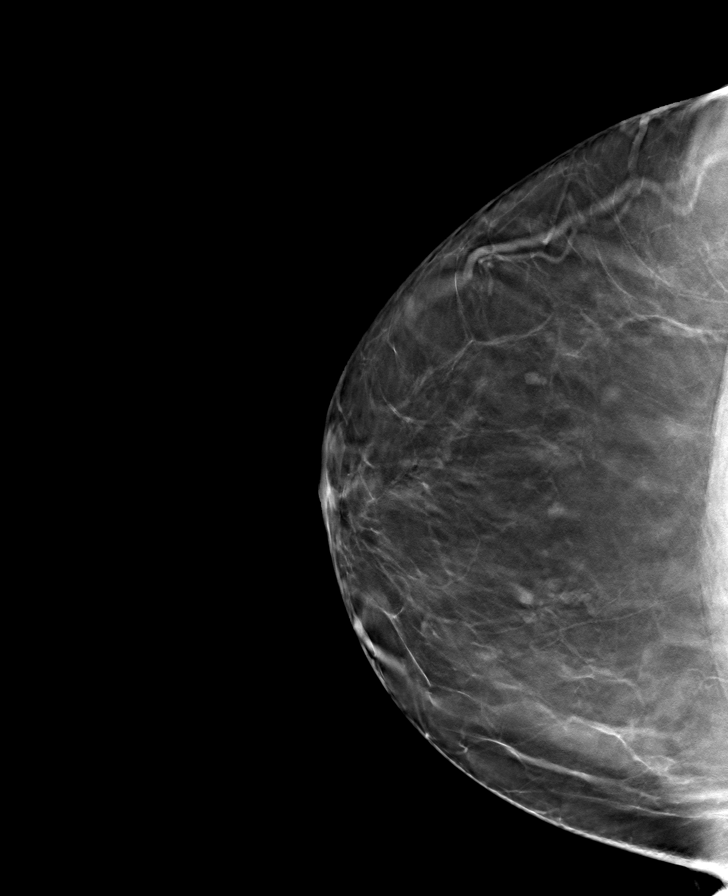

[8 of 24 positions shown; findings below may reference images not displayed]

ACR Breast Density Category b: There are scattered areas of
fibroglandular density.
FINDINGS: There are no findings suspicious for malignancy. Images were
processed with CAD.
IMPRESSION: No mammographic evidence of malignancy. A result letter of this
screening mammogram will be mailed directly to the patient.

RECOMMENDATION:
Screening mammogram in one year. (Code:[TQ])

BI-RADS CATEGORY  1: Negative.

## 2019-08-06 ENCOUNTER — Ambulatory Visit: Payer: BC Managed Care – PPO | Admitting: Internal Medicine

## 2019-10-08 ENCOUNTER — Encounter: Payer: Self-pay | Admitting: Internal Medicine

## 2020-01-23 ENCOUNTER — Encounter: Payer: Self-pay | Admitting: Internal Medicine

## 2020-01-30 ENCOUNTER — Encounter: Payer: Self-pay | Admitting: Internal Medicine

## 2020-01-31 ENCOUNTER — Encounter: Payer: Self-pay | Admitting: Internal Medicine

## 2020-01-31 NOTE — Progress Notes (Signed)
Subjective:    Patient ID: Tami Hughes, female    DOB: March 11, 1956, 64 y.o.   MRN: [id number]  HPI The patient is here for an acute visit.   Inflammation on back-  One week ago she went for a massage and her masseuse noticed that she had "inflammation" on the left side of her back.  She is unsure how long it has been there.  She has not had any pain.  She had her daughter look at it and she states there is no redness, warmth and she has not had any fevers.  She is unsure if it got larger or not.  At this point now she thinks she might feel a tightness or pressure in that area, but she cannot tell if that stressed her knowing that it is there.  Elevated blood pressure: She states she does not check her blood pressure at home.  She is taking her medication.  She states she was running around before she came here and thinks that may be why it is elevated.  Medications and allergies reviewed with patient and updated if appropriate.  Patient Active Problem List   Diagnosis Date Noted  . Lump of skin of back 02/01/2020  . Prediabetes 11/11/2016  . Herpes simplex without mention of complication 68/34/1962  . Obesity, Class II, BMI 35-39.9 05/02/2012  . Personal history of colonic polyps 01/27/2012  . VENOUS INSUFFICIENCY, LEGS 08/28/2010  . Hyperlipidemia 02/22/2008  . Essential hypertension 02/22/2008  . GERD 02/22/2008    Current Outpatient Medications on File Prior to Visit  Medication Sig Dispense Refill  . diltiazem (CARTIA XT) 240 MG 24 hr capsule Take 1 capsule (240 mg total) by mouth daily. 90 capsule 3  . lisinopril-hydrochlorothiazide (ZESTORETIC) 20-12.5 MG tablet Take 1 tablet by mouth daily. 90 tablet 3  . triamcinolone cream (KENALOG) 0.1 % Apply 1 application topically 2 (two) times daily. 30 g 0   No current facility-administered medications on file prior to visit.    Past Medical History:  Diagnosis Date  . Allergy   . Arthritis   . Dyslipidemia   . GERD  (gastroesophageal reflux disease)   . Hyperlipidemia   . Hypertension   . Polyp of colon 2009   adenoma benign  . Shingles    recurrent: 08/2010, 10/2013    Past Surgical History:  Procedure Laterality Date  . ABDOMINAL HYSTERECTOMY     TAH  . CHOLECYSTECTOMY N/A 12/18/2014   Procedure: LAPAROSCOPIC CHOLECYSTECTOMY WITH INTRAOPERATIVE CHOLANGIOGRAM;  Surgeon: Johnathan Hausen, MD;  Location: [facility];  Service: General;  Laterality: N/A;  . COLONOSCOPY    . LABIOPLASTY    . POLYPECTOMY    . PUBOVAGINAL SLING  05/08/2010   stress urinary incontinence  . VULVECTOMY     partial vulvectomy secondary to labial hypertrophy..    Social History   Socioeconomic History  . Marital status: Widowed    Spouse name: Not on file  . Number of children: 2  . Years of education: 33  . Highest education level: Not on file  Occupational History  . Occupation: Office manager: [employer]  Tobacco Use  . Smoking status: Never Smoker  . Smokeless tobacco: Never Used  Substance and Sexual Activity  . Alcohol use: Yes    Alcohol/week: 2.0 standard drinks    Types: 2 Standard drinks or equivalent per week    Comment: wine on weekends  . Drug use: No  . Sexual activity: Yes  Partners: Male  Other Topics Concern  . Not on file  Social History Narrative   HSG, [employer] - BA, [employer] - masters in Energy manager. Married '74 - 27 yrs/widowed. 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 Has high potential for work but will finish master's first. Lives alone.       CPA   Widow, single   2 children, 2 grandchildren   No regular exercise   1 large cup of coffee daily   Social Determinants of Health   Financial Resource Strain:   . Difficulty of Paying Living Expenses:   Food Insecurity:   . Worried About Charity fundraiser in the Last Year:   . Arboriculturist in the Last Year:   Transportation Needs:   . Film/video editor (Medical):   Marland Kitchen Lack  of Transportation (Non-Medical):   Physical Activity:   . Days of Exercise per Week:   . Minutes of Exercise per Session:   Stress:   . Feeling of Stress :   Social Connections:   . Frequency of Communication with Friends and Family:   . Frequency of Social Gatherings with Friends and Family:   . Attends Religious Services:   . Active Member of Clubs or Organizations:   . Attends Archivist Meetings:   Marland Kitchen Marital Status:     Family History  Problem Relation Age of Onset  . Kidney disease Mother        had transplant  . Breast cancer Mother   . Heart disease Sister   . Heart attack Sister   . Colon cancer Neg Hx   . Esophageal cancer Neg Hx   . Rectal cancer Neg Hx   . Stomach cancer Neg Hx   . Diabetes Neg Hx   . Hyperlipidemia Neg Hx   . COPD Neg Hx   . Colon polyps Neg Hx     Review of Systems  Constitutional: Negative for chills and fever.  Respiratory: Negative for shortness of breath.   Cardiovascular: Negative for chest pain and palpitations.  Skin: Negative for color change, rash and wound.  Neurological: Negative for light-headedness and headaches.       Objective:   Vitals:   02/01/20 1450  BP: (!) 174/100  Pulse: 62  Temp: 97.6 F (36.4 C)  SpO2: 97%   BP Readings from Last 3 Encounters:  02/01/20 (!) 174/100  02/02/19 (!) 142/80  11/15/17 (!) 150/78   Wt Readings from Last 3 Encounters:  02/01/20 217 lb (98.4 kg)  02/02/19 219 lb 12.8 oz (99.7 kg)  11/15/17 217 lb (98.4 kg)   Body mass index is 38.44 kg/m.   Physical Exam         Assessment & Plan:    See Problem List for Assessment and Plan of chronic medical problems.    This visit occurred during the SARS-CoV-2 public health emergency.  Safety protocols were in place, including screening questions prior to the visit, additional usage of staff PPE, and extensive cleaning of exam room while observing appropriate contact time as indicated for disinfecting  solutions.

## 2020-02-01 ENCOUNTER — Ambulatory Visit (INDEPENDENT_AMBULATORY_CARE_PROVIDER_SITE_OTHER): Payer: BC Managed Care – PPO | Admitting: Internal Medicine

## 2020-02-01 ENCOUNTER — Other Ambulatory Visit: Payer: Self-pay

## 2020-02-01 ENCOUNTER — Encounter: Payer: Self-pay | Admitting: Internal Medicine

## 2020-02-01 VITALS — BP 174/100 | HR 62 | Temp 97.6°F | Ht 63.0 in | Wt 217.0 lb

## 2020-02-01 DIAGNOSIS — R222 Localized swelling, mass and lump, trunk: Secondary | ICD-10-CM | POA: Insufficient documentation

## 2020-02-01 DIAGNOSIS — I10 Essential (primary) hypertension: Secondary | ICD-10-CM | POA: Diagnosis not present

## 2020-02-01 NOTE — Assessment & Plan Note (Signed)
Acute Palpable and visual lump left side of back below scapula and next to vertebrae Nontender, no overlying warmth or redness It is not a discrete mass like a typical lipoma, but has the same feeling of a lipoma We will get an ultrasound to confirm and rule out other causes

## 2020-02-01 NOTE — Assessment & Plan Note (Signed)
Chronic Blood pressure elevated here today and not ideally controlled based on her history She is not checking her blood pressure at [facility] that she needs to take this consistently over the next 2 weeks at different times of the day.  She does have an appointment with me in 2 weeks and we can reevaluate Stressed low-sodium diet, regular exercise, weight loss Continue current medications for now

## 2020-02-01 NOTE — Addendum Note (Signed)
Addended by: Binnie Rail on: 02/01/2020 04:43 PM   Modules accepted: Level of Service

## 2020-02-01 NOTE — Patient Instructions (Signed)
I ordered an ultrasound and someone will call you to schedule this.     Monitor your BP at home  - goal is < 140/90

## 2020-02-04 ENCOUNTER — Ambulatory Visit
Admission: RE | Admit: 2020-02-04 | Discharge: 2020-02-04 | Disposition: A | Discharge status: Home / Self Care | Payer: BC Managed Care – PPO | Source: Ambulatory Visit | Attending: Internal Medicine | Admitting: Internal Medicine

## 2020-02-04 ENCOUNTER — Encounter: Payer: BC Managed Care – PPO | Admitting: Internal Medicine

## 2020-02-04 DIAGNOSIS — R222 Localized swelling, mass and lump, trunk: Secondary | ICD-10-CM | POA: Diagnosis not present

## 2020-02-04 IMAGING — US US SOFT TISSUE
1 series · 12 of 12 positions shown · non-contrast
Comparison: None.

CLINICAL DATA: Evaluate palpable mass

EXAM:
CHEST ULTRASOUND

[Series 1: us soft tissue · 0.06mm/px · 12 acquisitions, 12 frames shown]
[im 1/12]
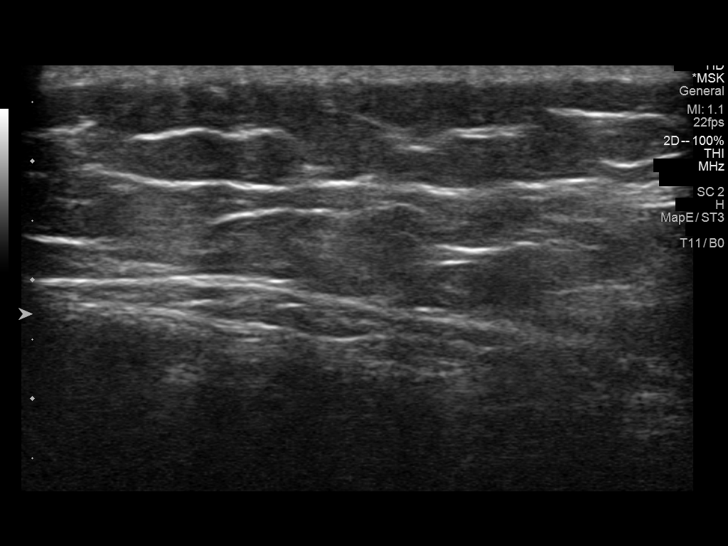
[im 2/12]
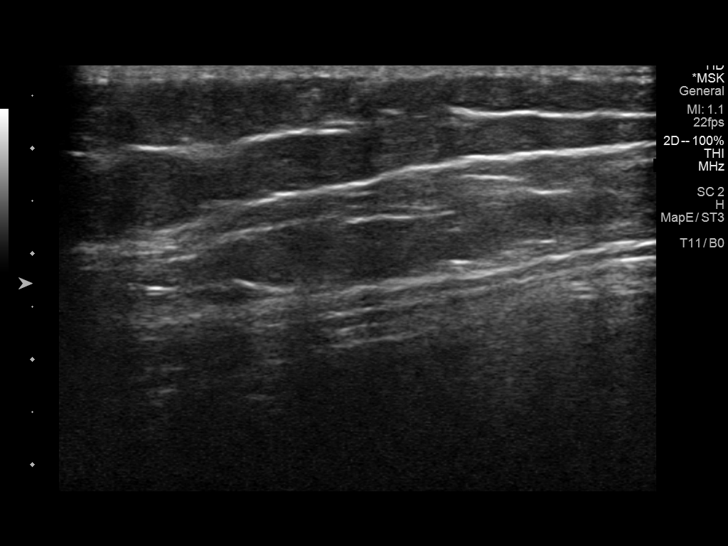
[im 3/12]
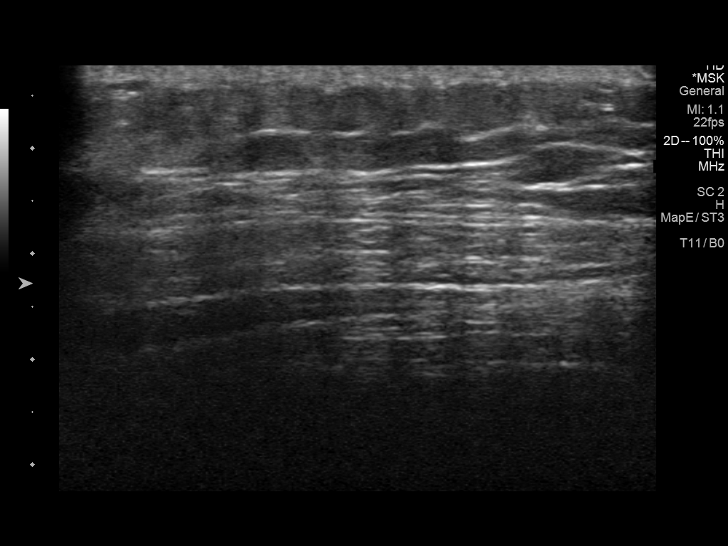
[im 4/12]
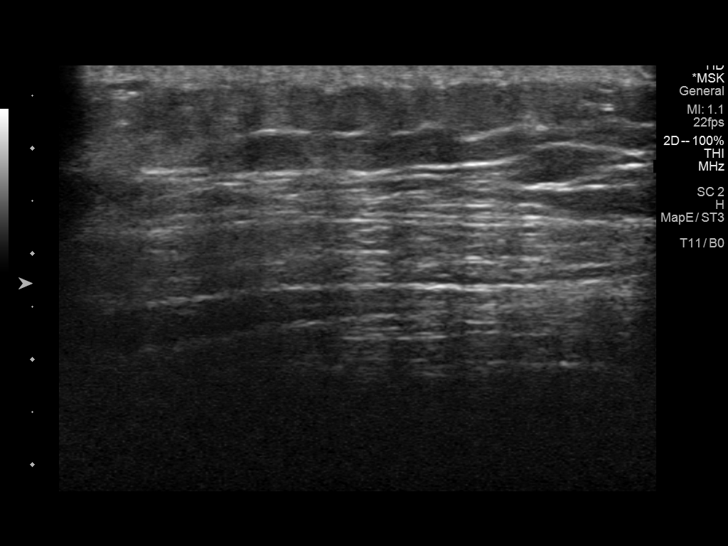
[im 5/12]
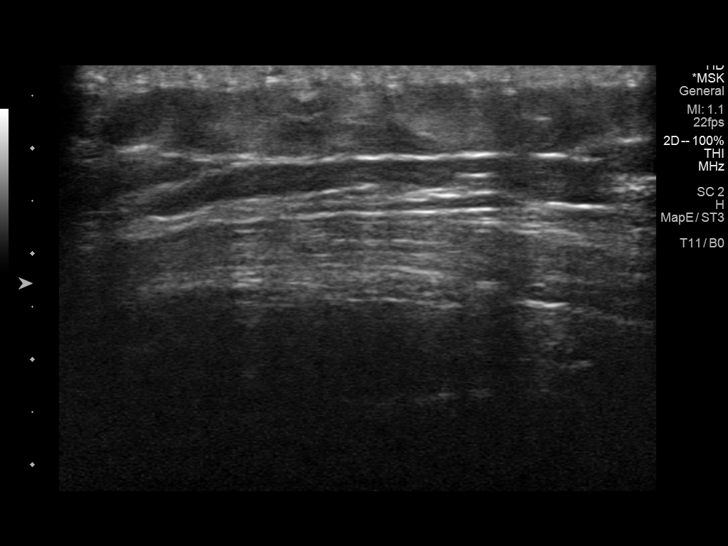
[im 6/12]
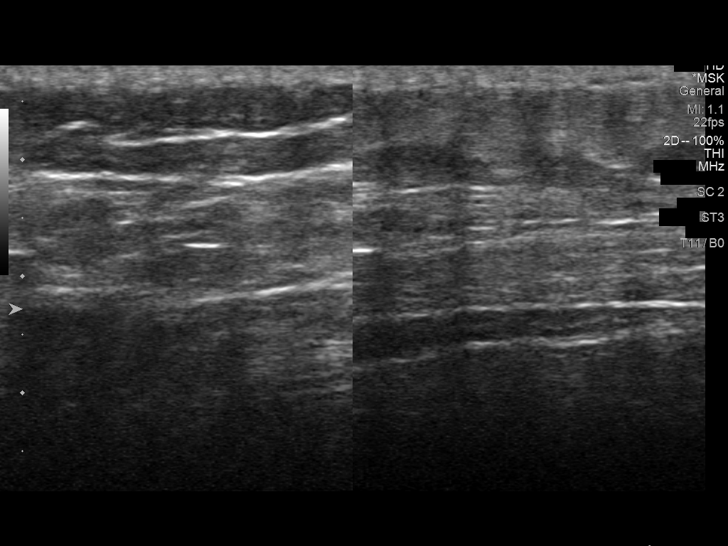
[im 7/12]
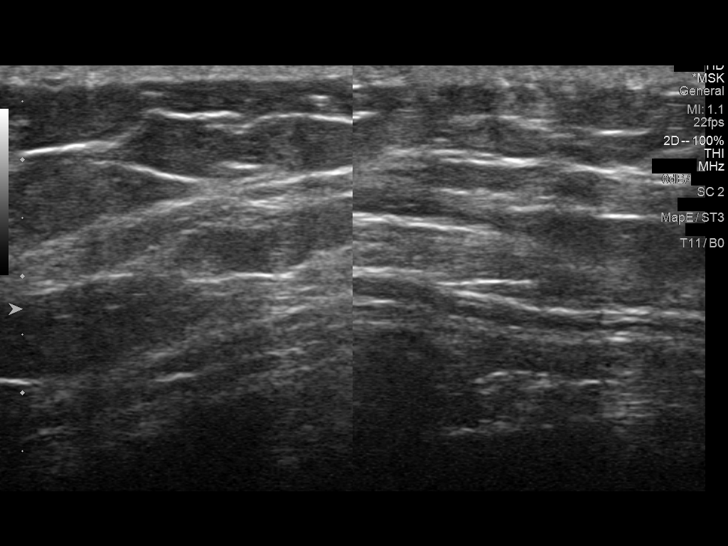
[im 8/12]
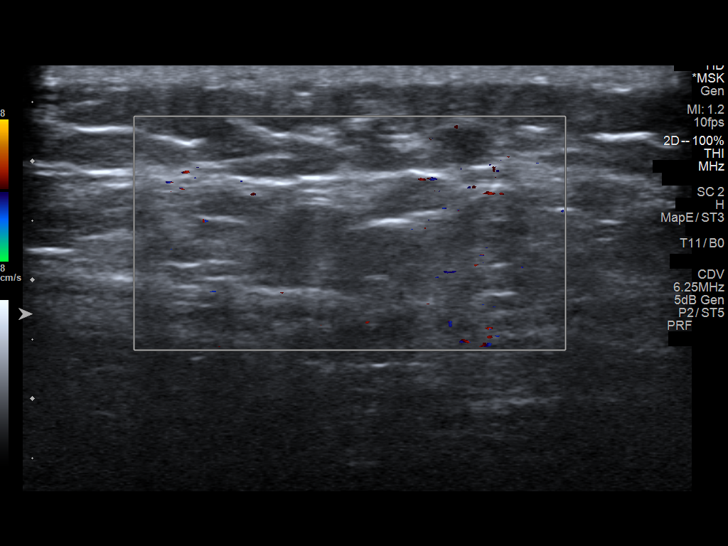
[im 9/12]
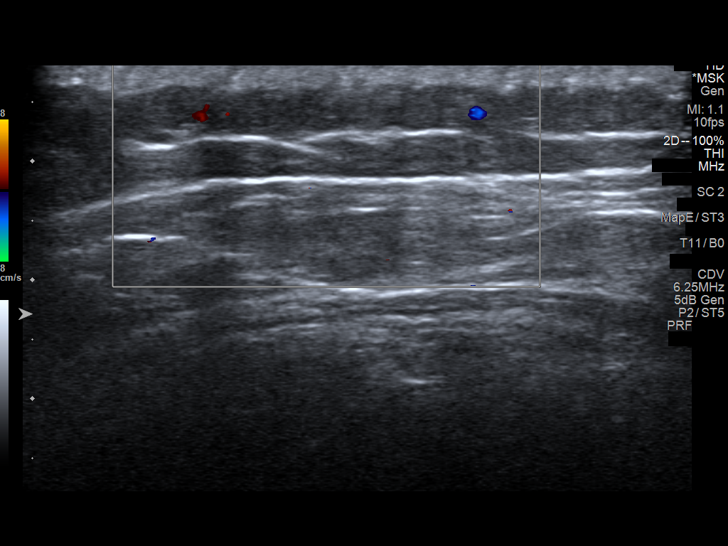
[im 10/12]
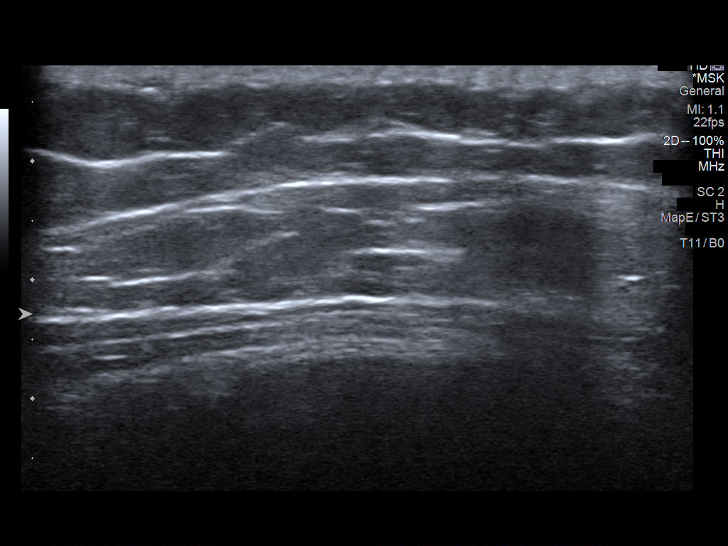
[im 11/12]
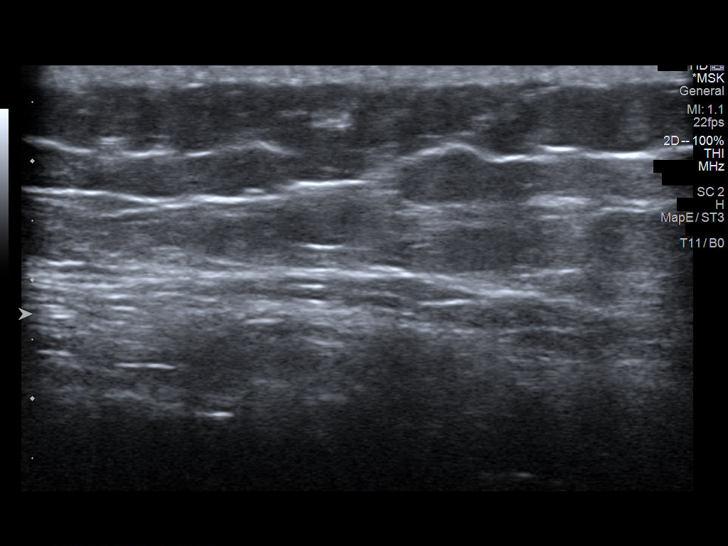
[im 12/12]
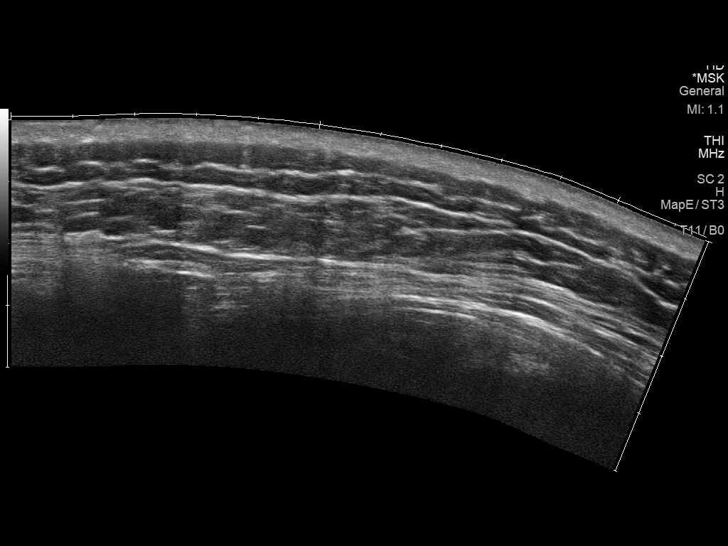

[12 of 12 positions shown; findings below may reference images not displayed]

FINDINGS: Dedicated ultrasound was performed over the area of concern within
the mid left posterior chest wall. No underlying mass or cyst
identified.
IMPRESSION: No abnormality identified. If there is a continued concern or if the
mass increases in size more definitive characterization may be
obtained with a contrast enhanced MRI of the chest wall.

## 2020-02-05 ENCOUNTER — Encounter: Payer: Self-pay | Admitting: Internal Medicine

## 2020-02-05 ENCOUNTER — Other Ambulatory Visit: Payer: Self-pay | Admitting: Internal Medicine

## 2020-02-05 DIAGNOSIS — H35033 Hypertensive retinopathy, bilateral: Secondary | ICD-10-CM | POA: Diagnosis not present

## 2020-02-11 NOTE — Patient Instructions (Addendum)
Blood work was ordered.    All other Health Maintenance issues reviewed.   All recommended immunizations and age-appropriate screenings are up-to-date or discussed.  No immunization administered today.   Medications reviewed and updated.  Changes include :  Start hctz 12.5 mg in addition to other BP medications.    Your prescription(s) have been submitted to your pharmacy. Please take as directed and contact our office if you believe you are having problem(s) with the medication(s).    Please followup in 6 months    Health Maintenance, Female Adopting a healthy lifestyle and getting preventive care are important in promoting health and wellness. Ask your health care provider about:  The right schedule for you to have regular tests and exams.  Things you can do on your own to prevent diseases and keep yourself healthy. What should I know about diet, weight, and exercise? Eat a healthy diet   Eat a diet that includes plenty of vegetables, fruits, low-fat dairy products, and lean protein.  Do not eat a lot of foods that are high in solid fats, added sugars, or sodium. Maintain a healthy weight Body mass index (BMI) is used to identify weight problems. It estimates body fat based on height and weight. Your health care provider can help determine your BMI and help you achieve or maintain a healthy weight. Get regular exercise Get regular exercise. This is one of the most important things you can do for your health. Most adults should:  Exercise for at least 150 minutes each week. The exercise should increase your heart rate and make you sweat (moderate-intensity exercise).  Do strengthening exercises at least twice a week. This is in addition to the moderate-intensity exercise.  Spend less time sitting. Even light physical activity can be beneficial. Watch cholesterol and blood lipids Have your blood tested for lipids and cholesterol at 65 years of age, then have this test every  5 years. Have your cholesterol levels checked more often if:  Your lipid or cholesterol levels are high.  You are older than 65 years of age.  You are at high risk for heart disease. What should I know about cancer screening? Depending on your health history and family history, you may need to have cancer screening at various ages. This may include screening for:  Breast cancer.  Cervical cancer.  Colorectal cancer.  Skin cancer.  Lung cancer. What should I know about heart disease, diabetes, and high blood pressure? Blood pressure and heart disease  High blood pressure causes heart disease and increases the risk of stroke. This is more likely to develop in people who have high blood pressure readings, are of African descent, or are overweight.  Have your blood pressure checked: ? Every 3-5 years if you are 67-44 years of age. ? Every year if you are 63 years old or older. Diabetes Have regular diabetes screenings. This checks your fasting blood sugar level. Have the screening done:  Once every three years after age 38 if you are at a normal weight and have a low risk for diabetes.  More often and at a younger age if you are overweight or have a high risk for diabetes. What should I know about preventing infection? Hepatitis B If you have a higher risk for hepatitis B, you should be screened for this virus. Talk with your health care provider to find out if you are at risk for hepatitis B infection. Hepatitis C Testing is recommended for:  Everyone born from [birth year]  through 1965.  Anyone with known risk factors for hepatitis C. Sexually transmitted infections (STIs)  Get screened for STIs, including gonorrhea and chlamydia, if: ? You are sexually active and are younger than 65 years of age. ? You are older than 65 years of age and your health care provider tells you that you are at risk for this type of infection. ? Your sexual activity has changed since you were last  screened, and you are at increased risk for chlamydia or gonorrhea. Ask your health care provider if you are at risk.  Ask your health care provider about whether you are at high risk for HIV. Your health care provider may recommend a prescription medicine to help prevent HIV infection. If you choose to take medicine to prevent HIV, you should first get tested for HIV. You should then be tested every 3 months for as long as you are taking the medicine. Pregnancy  If you are about to stop having your period (premenopausal) and you may become pregnant, seek counseling before you get pregnant.  Take 400 to 800 micrograms (mcg) of folic acid every day if you become pregnant.  Ask for birth control (contraception) if you want to prevent pregnancy. Osteoporosis and menopause Osteoporosis is a disease in which the bones lose minerals and strength with aging. This can result in bone fractures. If you are 56 years old or older, or if you are at risk for osteoporosis and fractures, ask your health care provider if you should:  Be screened for bone loss.  Take a calcium or vitamin D supplement to lower your risk of fractures.  Be given hormone replacement therapy (HRT) to treat symptoms of menopause. Follow these instructions at home: Lifestyle  Do not use any products that contain nicotine or tobacco, such as cigarettes, e-cigarettes, and chewing tobacco. If you need help quitting, ask your health care provider.  Do not use street drugs.  Do not share needles.  Ask your health care provider for help if you need support or information about quitting drugs. Alcohol use  Do not drink alcohol if: ? Your health care provider tells you not to drink. ? You are pregnant, may be pregnant, or are planning to become pregnant.  If you drink alcohol: ? Limit how much you use to 0-1 drink a day. ? Limit intake if you are breastfeeding.  Be aware of how much alcohol is in your drink. In the [place] one  drink equals one 12 oz bottle of beer (355 mL), one 5 oz glass of wine (148 mL), or one 1 oz glass of hard liquor (44 mL). General instructions  Schedule regular health, dental, and eye exams.  Stay current with your vaccines.  Tell your health care provider if: ? You often feel depressed. ? You have ever been abused or do not feel safe at home. Summary  Adopting a healthy lifestyle and getting preventive care are important in promoting health and wellness.  Follow your health care provider's instructions about healthy diet, exercising, and getting tested or screened for diseases.  Follow your health care provider's instructions on monitoring your cholesterol and blood pressure. This information is not intended to replace advice given to you by your health care provider. Make sure you discuss any questions you have with your health care provider. Document Revised: 06/07/2018 Document Reviewed: 06/07/2018 Elsevier Patient Education  2020 [employer].

## 2020-02-11 NOTE — Progress Notes (Signed)
Subjective:    Patient ID: Tami Hughes, female    DOB: 25-Feb-1956, 64 y.o.   MRN: [id number]  HPI She is here for a physical exam.   BP at home 124/74, 140/85, 113/66, 147/70, 149/70, 145/75, 149/80, 129/79.  Her cough compared to ours is slightly lower.  She is doing better with watching her sodium intake.  She is working on weight loss.  On treadmill for 15 in a day.  She is still concerned about the lumbar prominence in her left mid back.  She was interested in having this evaluated further.  Medications and allergies reviewed with patient and updated if appropriate.  Patient Active Problem List   Diagnosis Date Noted  . Lump of skin of back 02/01/2020  . Prediabetes 11/11/2016  . Herpes simplex without mention of complication 01/65/5374  . Obesity, Class II, BMI 35-39.9 05/02/2012  . Personal history of colonic polyps 01/27/2012  . VENOUS INSUFFICIENCY, LEGS 08/28/2010  . Hyperlipidemia 02/22/2008  . Essential hypertension 02/22/2008  . GERD 02/22/2008    Current Outpatient Medications on File Prior to Visit  Medication Sig Dispense Refill  . diltiazem (CARDIZEM CD) 240 MG 24 hr capsule Take 1 capsule by mouth once daily 90 capsule 3  . lisinopril-hydrochlorothiazide (ZESTORETIC) 20-12.5 MG tablet Take 1 tablet by mouth once daily 90 tablet 3  . triamcinolone cream (KENALOG) 0.1 % Apply 1 application topically 2 (two) times daily. 30 g 0   No current facility-administered medications on file prior to visit.    Past Medical History:  Diagnosis Date  . Allergy   . Arthritis   . Dyslipidemia   . GERD (gastroesophageal reflux disease)   . Hyperlipidemia   . Hypertension   . Polyp of colon 2009   adenoma benign  . Shingles    recurrent: 08/2010, 10/2013    Past Surgical History:  Procedure Laterality Date  . ABDOMINAL HYSTERECTOMY     TAH  . CHOLECYSTECTOMY N/A 12/18/2014   Procedure: LAPAROSCOPIC CHOLECYSTECTOMY WITH INTRAOPERATIVE CHOLANGIOGRAM;  Surgeon:  Johnathan Hausen, MD;  Location: [facility];  Service: General;  Laterality: N/A;  . COLONOSCOPY    . LABIOPLASTY    . POLYPECTOMY    . PUBOVAGINAL SLING  05/08/2010   stress urinary incontinence  . VULVECTOMY     partial vulvectomy secondary to labial hypertrophy..    Social History   Socioeconomic History  . Marital status: Widowed    Spouse name: Not on file  . Number of children: 2  . Years of education: 56  . Highest education level: Not on file  Occupational History  . Occupation: Office manager: [employer]  Tobacco Use  . Smoking status: Never Smoker  . Smokeless tobacco: Never Used  Substance and Sexual Activity  . Alcohol use: Yes    Alcohol/week: 2.0 standard drinks    Types: 2 Standard drinks or equivalent per week    Comment: wine on weekends  . Drug use: No  . Sexual activity: Yes    Partners: Male  Other Topics Concern  . Not on file  Social History Narrative   HS[employer], [employer] - BA, [employer] - masters in Energy manager. Married '74 - 27 yrs/widowed. 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 Has high potential for work but will finish master's first. Lives alone.       CPA   Widow, single   2 children, 2 grandchildren   No regular exercise  1 large cup of coffee daily   Social Determinants of Health   Financial Resource Strain:   . Difficulty of Paying Living Expenses:   Food Insecurity:   . Worried About Charity fundraiser in the Last Year:   . Arboriculturist in the Last Year:   Transportation Needs:   . Film/video editor (Medical):   Marland Kitchen Lack of Transportation (Non-Medical):   Physical Activity:   . Days of Exercise per Week:   . Minutes of Exercise per Session:   Stress:   . Feeling of Stress :   Social Connections:   . Frequency of Communication with Friends and Family:   . Frequency of Social Gatherings with Friends and Family:   . Attends Religious Services:   . Active Member of Clubs or  Organizations:   . Attends Archivist Meetings:   Marland Kitchen Marital Status:     Family History  Problem Relation Age of Onset  . Kidney disease Mother        had transplant  . Breast cancer Mother   . Heart disease Sister   . Heart attack Sister   . Colon cancer Neg Hx   . Esophageal cancer Neg Hx   . Rectal cancer Neg Hx   . Stomach cancer Neg Hx   . Diabetes Neg Hx   . Hyperlipidemia Neg Hx   . COPD Neg Hx   . Colon polyps Neg Hx     Review of Systems  Constitutional: Negative for chills and fever.  Eyes: Negative for visual disturbance.  Respiratory: Negative for cough, shortness of breath and wheezing.   Cardiovascular: Positive for palpitations (occ - when very active). Negative for chest pain and leg swelling.  Gastrointestinal: Negative for abdominal pain, blood in stool, constipation, diarrhea and nausea.       Occ gerd -takes Chief Strategy Officer, occ otc PPI  Genitourinary: Negative for dysuria and hematuria.  Musculoskeletal: Positive for back pain (lower back). Negative for arthralgias.  Skin: Negative for rash.  Neurological: Negative for light-headedness and headaches.  Psychiatric/Behavioral: Negative for dysphoric mood. The patient is nervous/anxious.        Objective:   Vitals:   02/12/20 0805  BP: (!) 158/70  Pulse: 89  Temp: 98.2 F (36.8 C)  SpO2: 99%   Filed Weights   02/12/20 0805  Weight: 218 lb 3.2 oz (99 kg)   Body mass index is 38.65 kg/m.  BP Readings from Last 3 Encounters:  02/12/20 (!) 158/70  02/01/20 (!) 174/100  02/02/19 (!) 142/80    Wt Readings from Last 3 Encounters:  02/12/20 218 lb 3.2 oz (99 kg)  02/01/20 217 lb (98.4 kg)  02/02/19 219 lb 12.8 oz (99.7 kg)     Physical Exam Constitutional: She appears well-developed and well-nourished. No distress.  HENT:  Head: Normocephalic and atraumatic.  Right Ear: External ear normal. Normal ear canal and TM Left Ear: External ear normal.  Normal ear canal and  TM Mouth/Throat: Oropharynx is clear and moist.  Eyes: Conjunctivae and EOM are normal.  Neck: Neck supple. No tracheal deviation present. No thyromegaly present.  No carotid bruit  Cardiovascular: Normal rate, regular rhythm and normal heart sounds.   No murmur heard.  No edema. Pulmonary/Chest: Effort normal and breath sounds normal. No respiratory distress. She has no wheezes. She has no rales.  Breast: deferred   Abdominal: Soft. She exhibits no distension. There is no tenderness.  Lymphadenopathy: She has no cervical  adenopathy. Musculoskeletal: Prominence left mid back below scapula and next to vertebrae-not a discrete mass like a lipoma and not mobile.  Approximately size of a medium avocado.  Nontender to palpation Skin: Skin is warm and dry. She is not diaphoretic.  Psychiatric: She has a normal mood and affect. Her behavior is normal.        Assessment & Plan:   Physical exam: Screening blood work    ordered Immunizations   Had covid, prevnar due, discussed shingrix Colonoscopy  Up to date  Mammogram  Up to date  Gyn  No longer seeing  Dexa  Due - normal in 2013 - ordered Eye exams  Up to date  Exercise  On treadmill daily x 15 min a day Weight  Has stress eating - trying to lose weight Substance abuse     none  See Problem List for Assessment and Plan of chronic medical problems.   This visit occurred during the SARS-CoV-2 public health emergency.  Safety protocols were in place, including screening questions prior to the visit, additional usage of staff PPE, and extensive cleaning of exam room while observing appropriate contact time as indicated for disinfecting solutions.

## 2020-02-12 ENCOUNTER — Ambulatory Visit (INDEPENDENT_AMBULATORY_CARE_PROVIDER_SITE_OTHER): Payer: BC Managed Care – PPO | Admitting: Internal Medicine

## 2020-02-12 ENCOUNTER — Other Ambulatory Visit: Payer: Self-pay

## 2020-02-12 ENCOUNTER — Encounter: Payer: Self-pay | Admitting: Internal Medicine

## 2020-02-12 VITALS — BP 138/78 | HR 89 | Temp 98.2°F | Ht 63.0 in | Wt 218.2 lb

## 2020-02-12 DIAGNOSIS — I10 Essential (primary) hypertension: Secondary | ICD-10-CM

## 2020-02-12 DIAGNOSIS — R7303 Prediabetes: Secondary | ICD-10-CM

## 2020-02-12 DIAGNOSIS — E669 Obesity, unspecified: Secondary | ICD-10-CM

## 2020-02-12 DIAGNOSIS — E782 Mixed hyperlipidemia: Secondary | ICD-10-CM

## 2020-02-12 DIAGNOSIS — R222 Localized swelling, mass and lump, trunk: Secondary | ICD-10-CM

## 2020-02-12 DIAGNOSIS — Z0000 Encounter for general adult medical examination without abnormal findings: Secondary | ICD-10-CM

## 2020-02-12 DIAGNOSIS — Z13820 Encounter for screening for osteoporosis: Secondary | ICD-10-CM

## 2020-02-12 MED ORDER — HYDROCHLOROTHIAZIDE 12.5 MG PO TABS: 12.5000 mg | ORAL_TABLET | Freq: Every day | ORAL | 3 refills | Status: DC

## 2020-02-12 NOTE — Assessment & Plan Note (Signed)
Chronic Blood pressure initially elevated here, better on repeat but still borderline Measures at home still borderline Continue diltiazem to 40 mg daily Continue lisinopril-hydrochlorothiazide 20-12.5 mg daily Add hydrochlorothiazide 12.5 mg daily Continue to monitor blood pressure at home Continue low-sodium diet, regular exercise Stressed weight loss CMP

## 2020-02-12 NOTE — Assessment & Plan Note (Signed)
Chronic Check a1c Low sugar / carb diet Stressed regular exercise  

## 2020-02-12 NOTE — Assessment & Plan Note (Signed)
Acute Ultrasound was nondiagnostic Prominence is still there and difficult to say what it is-we both agree that we should evaluate further MRI ordered

## 2020-02-12 NOTE — Assessment & Plan Note (Signed)
Chronic Check lipid panel Currently not on medication Stressed exercise, healthy diet and weight loss

## 2020-02-12 NOTE — Assessment & Plan Note (Signed)
Discussed importance of weight loss ?Stressed regular exercise - goal 30 minutes 5 days a week ?Discussed decreasing portions, decreasing sugars/carbs ?Increase veges, lean protin ? ? ?

## 2020-02-13 LAB — LIPID PANEL
Cholesterol: 256 mg/dL — ABNORMAL HIGH (ref <200)
HDL: 58 mg/dL (ref >=50)
LDL Cholesterol (Calc): 173 mg/dL (calc) — ABNORMAL HIGH
Non-HDL Cholesterol (Calc): 198 mg/dL (calc) — ABNORMAL HIGH (ref <130)
Total CHOL/HDL Ratio: 4.4 (calc) (ref <5.0)
Triglycerides: 119 mg/dL (ref <150)

## 2020-02-13 LAB — COMPREHENSIVE METABOLIC PANEL
AG Ratio: 1.8 (calc) (ref 1.0–2.5)
ALT: 35 U/L — ABNORMAL HIGH (ref 6–29)
AST: 23 U/L (ref 10–35)
Albumin: 4.2 g/dL (ref 3.6–5.1)
Alkaline phosphatase (APISO): 86 U/L (ref 37–153)
BUN/Creatinine Ratio: 12 (calc) (ref 6–22)
BUN: 12 mg/dL (ref 7–25)
CO2: 30 mmol/L (ref 20–32)
Calcium: 9.9 mg/dL (ref 8.6–10.4)
Chloride: 105 mmol/L (ref 98–110)
Creat: 1.04 mg/dL — ABNORMAL HIGH (ref 0.50–0.99)
Globulin: 2.4 g/dL (calc) (ref 1.9–3.7)
Glucose, Bld: 85 mg/dL (ref 65–99)
Potassium: 4.1 mmol/L (ref 3.5–5.3)
Sodium: 142 mmol/L (ref 135–146)
Total Bilirubin: 0.6 mg/dL (ref 0.2–1.2)
Total Protein: 6.6 g/dL (ref 6.1–8.1)

## 2020-02-13 LAB — CBC WITH DIFFERENTIAL/PLATELET
Absolute Monocytes: 408 cells/uL (ref 200–950)
Basophils Absolute: 20 cells/uL (ref 0–200)
Basophils Relative: 0.4 %
Eosinophils Absolute: 97 cells/uL (ref 15–500)
Eosinophils Relative: 1.9 %
HCT: 41.4 % (ref 35.0–45.0)
Hemoglobin: 13.5 g/dL (ref 11.7–15.5)
Lymphs Abs: 2387 cells/uL (ref 850–3900)
MCH: 30.5 pg (ref 27.0–33.0)
MCHC: 32.6 g/dL (ref 32.0–36.0)
MCV: 93.7 fL (ref 80.0–100.0)
MPV: 10.8 fL (ref 7.5–12.5)
Monocytes Relative: 8 %
Neutro Abs: 2188 cells/uL (ref 1500–7800)
Neutrophils Relative %: 42.9 %
Platelets: 214 10*3/uL (ref 140–400)
RBC: 4.42 10*6/uL (ref 3.80–5.10)
RDW: 13.5 % (ref 11.0–15.0)
Total Lymphocyte: 46.8 %
WBC: 5.1 10*3/uL (ref 3.8–10.8)

## 2020-02-13 LAB — HEMOGLOBIN A1C
Hgb A1c MFr Bld: 6.1 % of total Hgb — ABNORMAL HIGH (ref <5.7)
Mean Plasma Glucose: 128 (calc)
eAG (mmol/L): 7.1 (calc)

## 2020-02-13 LAB — TSH: TSH: 1.33 mIU/L (ref 0.40–4.50)

## 2020-02-15 ENCOUNTER — Encounter: Payer: Self-pay | Admitting: Internal Medicine

## 2020-02-15 DIAGNOSIS — E7849 Other hyperlipidemia: Secondary | ICD-10-CM

## 2020-02-17 MED ORDER — ROSUVASTATIN CALCIUM 5 MG PO TABS: 5.0000 mg | ORAL_TABLET | Freq: Every day | ORAL | 1 refills | Status: DC

## 2020-02-17 MED ORDER — DIAZEPAM 5 MG PO TABS: ORAL_TABLET | ORAL | 1 refills | Status: DC

## 2020-02-26 ENCOUNTER — Other Ambulatory Visit: Payer: Self-pay

## 2020-02-26 ENCOUNTER — Ambulatory Visit
Admission: RE | Admit: 2020-02-26 | Discharge: 2020-02-26 | Disposition: A | Discharge status: Home / Self Care | Payer: BC Managed Care – PPO | Source: Ambulatory Visit | Attending: Internal Medicine | Admitting: Internal Medicine

## 2020-02-26 DIAGNOSIS — R222 Localized swelling, mass and lump, trunk: Secondary | ICD-10-CM | POA: Diagnosis not present

## 2020-02-26 IMAGING — MR MR CHEST MEDIASTINUM W/O CM
5 series · 16 of 16 positions shown · non-contrast
Comparison: Soft tissue ultrasound of [DATE]

CLINICAL DATA: Soft tissue mass along the left back

EXAM:
MR CHEST WITHOUT CONTRAST
TECHNIQUE: Multiplanar, multisequence MR imaging of the left posterior thorax
was performed. No intravenous contrast was administered. The left
upper posterior thorax centered on the region of palpable concern
was used to center today's imaging.

[Series 5: t2_ax fs · axial · 5.0mm · 0.41mm/px · z∈[+86,+224]mm · 3 of 23 slices shown]
[im 1/23]
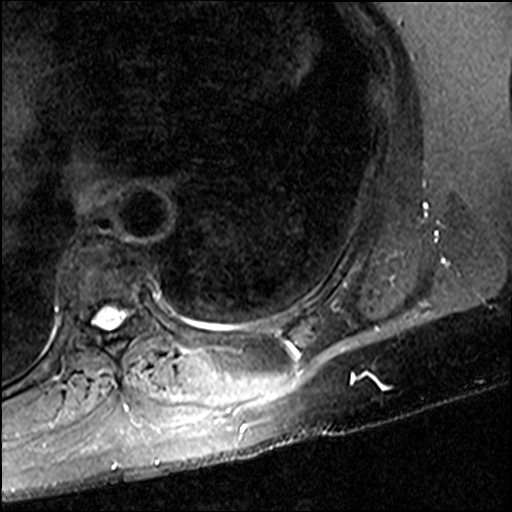
[im 12/23]
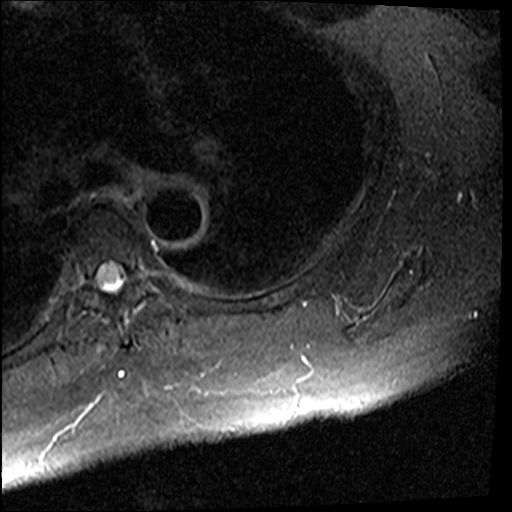
[im 23/23]
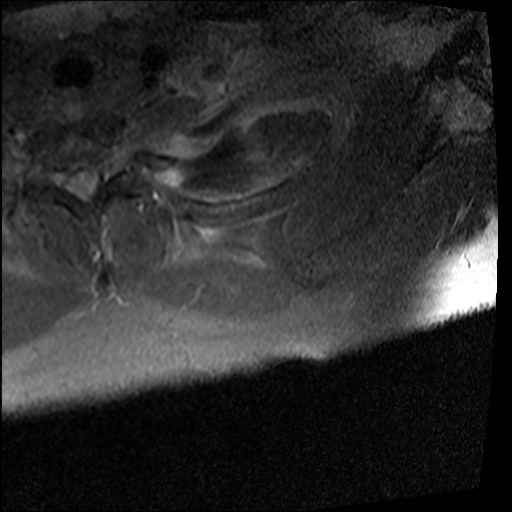

[Series 6: T1 · axial · 5.0mm · 0.41mm/px · z∈[+86,+224]mm · 4 of 23 slices shown (1 of 2)]
[im 1/23]
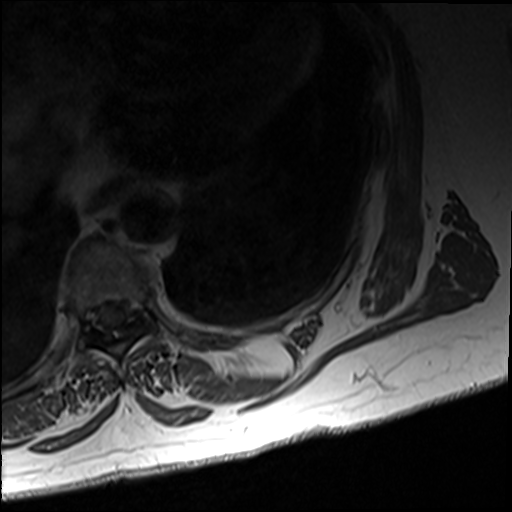
[im 8/23]
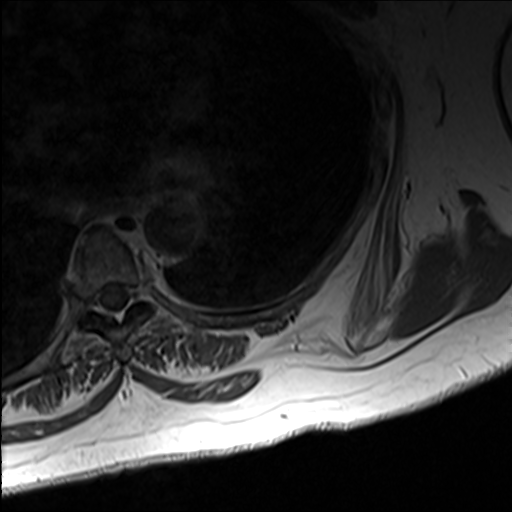
[im 15/23]
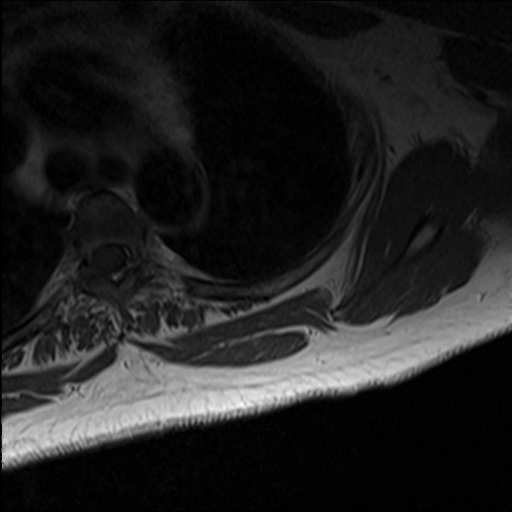
[im 23/23]
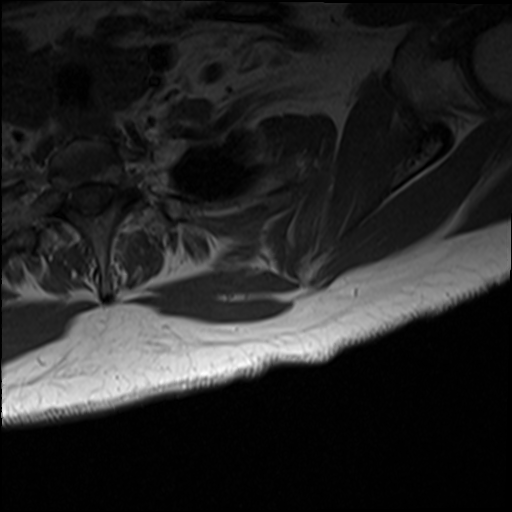

[Series 7: T1 · coronal · 4.0mm · 0.47mm/px · 3 of 18 slices shown (2 of 2)]
[im 1/18]
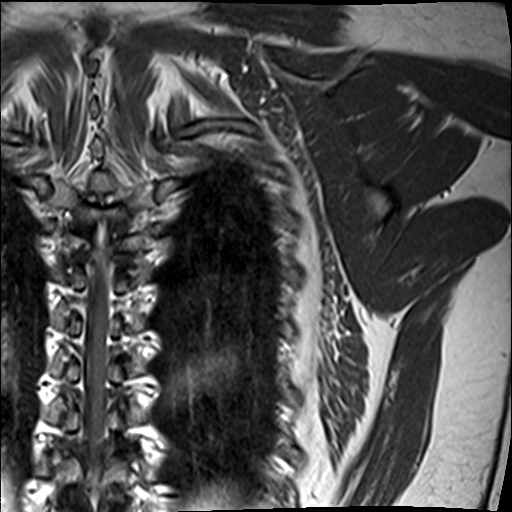
[im 9/18]
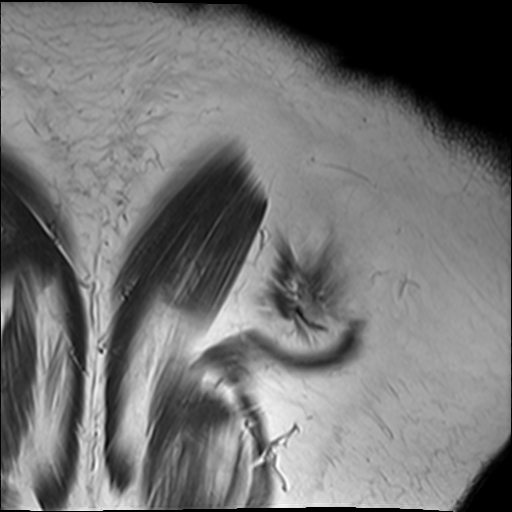
[im 18/18]
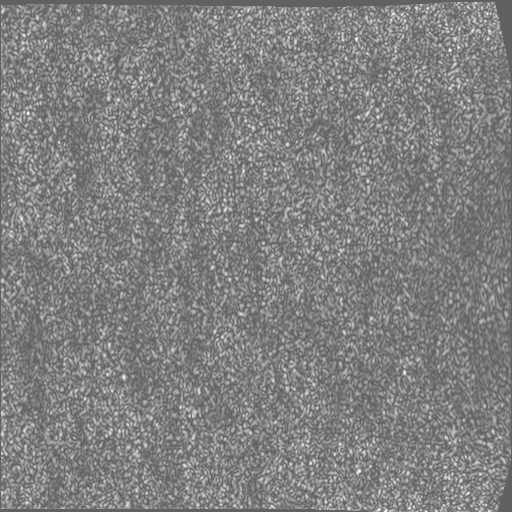

[Series 8: t2_tse_cor fs · coronal · 4.0mm · 0.47mm/px · 3 of 18 slices shown]
[im 1/18]
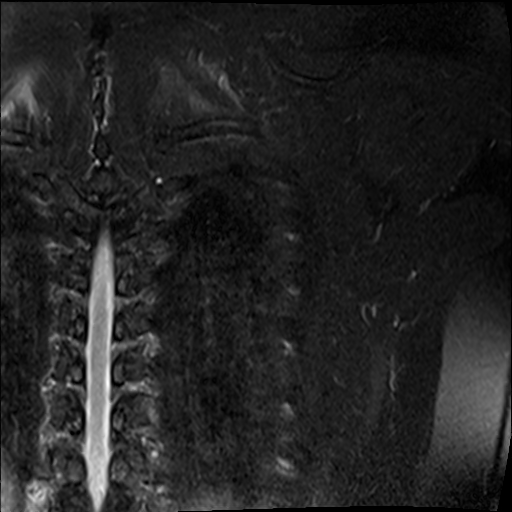
[im 9/18]
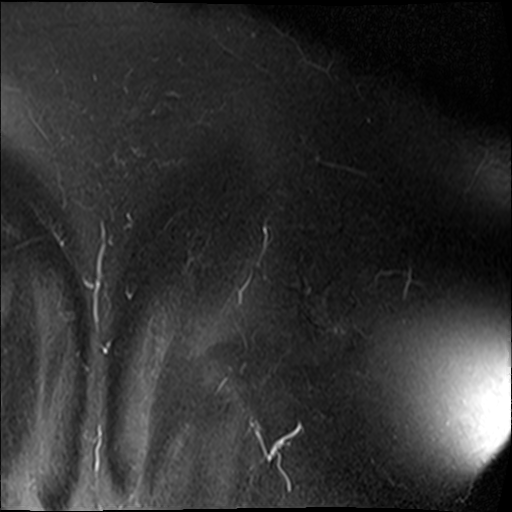
[im 18/18]
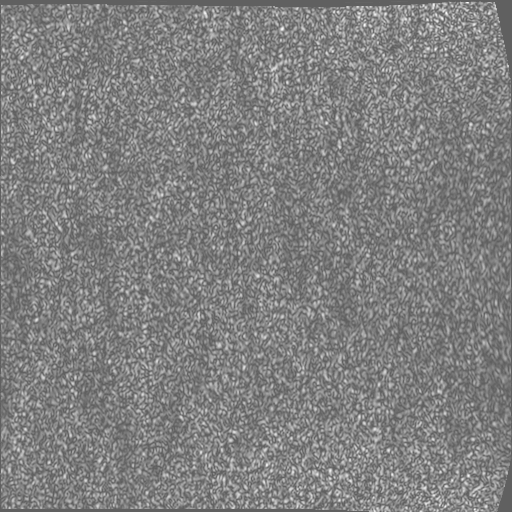

[Series 9: STIR · sagittal · 5.0mm · 0.39mm/px · 3 of 19 slices shown]
[im 1/19]
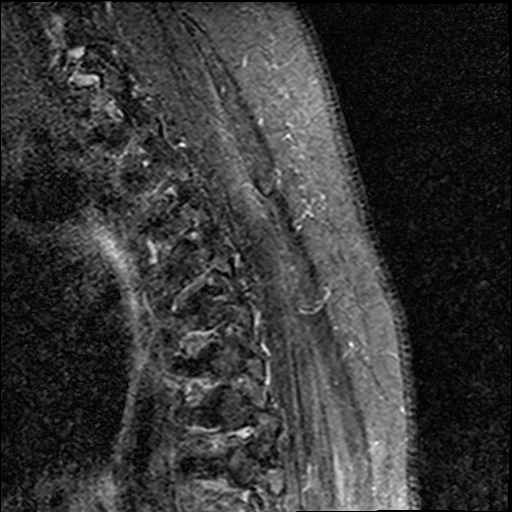
[im 10/19]
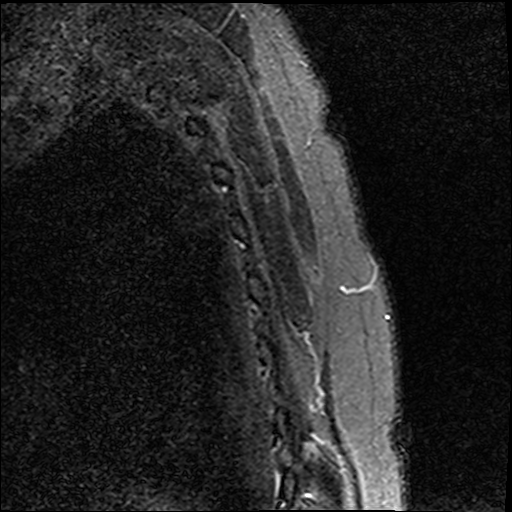
[im 19/19]
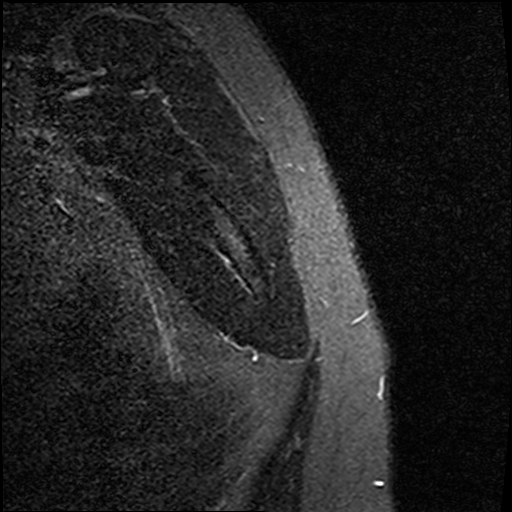

[16 of 16 positions shown; findings below may reference images not displayed]

FINDINGS: Bones/Joint/Cartilage:

The ribs and scapula in the region of concern appear unremarkable.

Ligaments:

N/A

Muscles and Tendons:

Underlying the region of concern, the trapezius and rhomboid major
muscles appear normal. The medial part of the latissimus dorsi
likewise appears normal. No abnormality of the left paraspinal
musculature is identified.

Soft tissue:

Below the level of indicated concern by the patient, and only
partially included on today's exam, there appears to be a fatty mass
of the left erector spinae muscle. The very top of this lesion is
shown on image 23 of series 6, and the coronal images short larger
portion of this lesion measuring at least 5.7 cm transverse by
cm craniocaudad, but extending below the inferior margin of imaging.
This appears to have some muscle bundles tracking within the fatty
lesion as shown on images 11 through 15 of series 7. The top margin
of this lesion is at about the eighth rib level posteromedially,
with lesion extending down past the left eleventh rib level.

In the area demarcated by the vitamin-E capsules, I do not see a
separate mass.
IMPRESSION: 1. Below the level indicated by the patient as the region of
concern, we include the top of a fatty mass of the left erector
spinae muscles. Because this was below the area of concern as
indicated by the patient, only a small portion of this lesion was
included. The included portion measures at least 5.7 by 6.5 cm and
has some muscle bundles tracking within the fatty mass (for example,
see of series 7). This is most likely an intramuscular lipoma. Now
that the mass has been better localized, consider repeat MRI with
and without contrast for more definitive characterization.

## 2020-02-27 ENCOUNTER — Encounter: Payer: Self-pay | Admitting: Internal Medicine

## 2020-02-28 ENCOUNTER — Encounter: Payer: Self-pay | Admitting: Internal Medicine

## 2020-02-28 DIAGNOSIS — R222 Localized swelling, mass and lump, trunk: Secondary | ICD-10-CM

## 2020-02-29 ENCOUNTER — Encounter: Payer: Self-pay | Admitting: Internal Medicine

## 2020-02-29 NOTE — Addendum Note (Signed)
Addended by: Binnie Rail on: 02/29/2020 08:54 AM   Modules accepted: Orders

## 2020-03-02 ENCOUNTER — Other Ambulatory Visit: Payer: BC Managed Care – PPO

## 2020-03-16 ENCOUNTER — Other Ambulatory Visit: Payer: Self-pay

## 2020-03-16 ENCOUNTER — Ambulatory Visit
Admission: RE | Admit: 2020-03-16 | Discharge: 2020-03-16 | Disposition: A | Discharge status: Home / Self Care | Payer: BC Managed Care – PPO | Source: Ambulatory Visit | Attending: Internal Medicine | Admitting: Internal Medicine

## 2020-03-16 DIAGNOSIS — D1809 Hemangioma of other sites: Secondary | ICD-10-CM | POA: Diagnosis not present

## 2020-03-16 DIAGNOSIS — N281 Cyst of kidney, acquired: Secondary | ICD-10-CM | POA: Diagnosis not present

## 2020-03-16 DIAGNOSIS — D1779 Benign lipomatous neoplasm of other sites: Secondary | ICD-10-CM | POA: Diagnosis not present

## 2020-03-16 DIAGNOSIS — R222 Localized swelling, mass and lump, trunk: Secondary | ICD-10-CM

## 2020-03-16 DIAGNOSIS — D171 Benign lipomatous neoplasm of skin and subcutaneous tissue of trunk: Secondary | ICD-10-CM | POA: Diagnosis not present

## 2020-03-16 IMAGING — MR MR CHEST MEDIASTINUM WO/W CM
9 series · 16 of 16 positions shown · IV contrast (multihance)
Comparison: Soft tissue ultrasound of [DATE]

CLINICAL DATA: Soft tissue mass along the medial upper back

EXAM:
MRI CHEST WITHOUT AND WITH CONTRAST
TECHNIQUE: Multiplanar multisequence MR images were obtained through the region
of concern along the left upper back before and after IV contrast
administration.
CONTRAST:  20mL MULTIHANCE GADOBENATE DIMEGLUMINE 529 MG/ML IV SOLN

[Series 5: T2 fat-sat · axial · left · 4.0mm · 0.65mm/px · z∈[-98,+84]mm · 3 of 39 slices shown (1 of 2)]
[im 1/39]
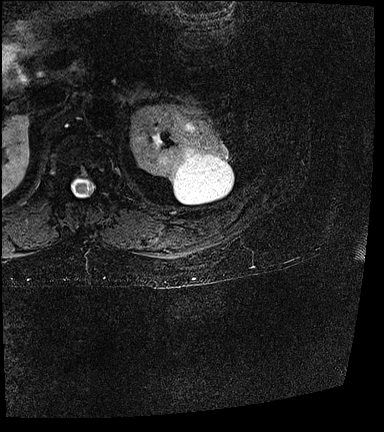
[im 20/39]
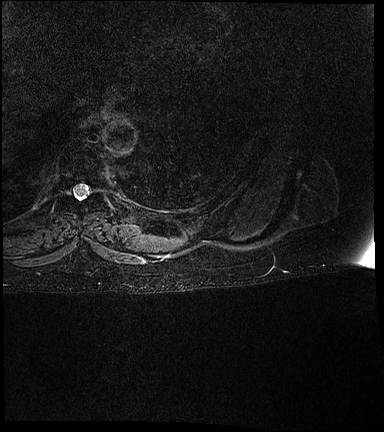
[im 39/39]
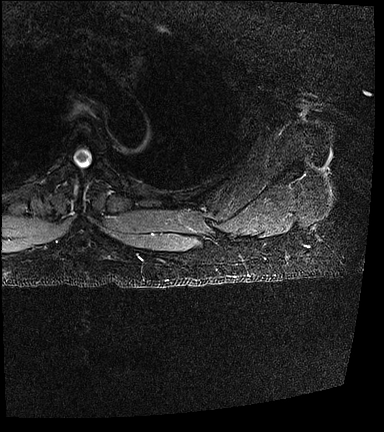

[Series 6: T1 · axial · left · 4.0mm · 0.78mm/px · z∈[-98,+84]mm · 3 of 39 slices shown (1 of 2)]
[im 1/39]
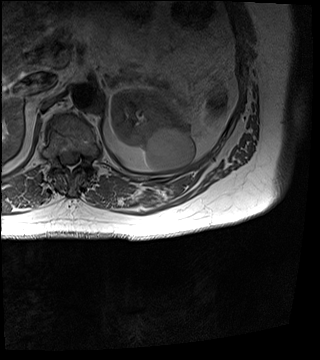
[im 20/39]
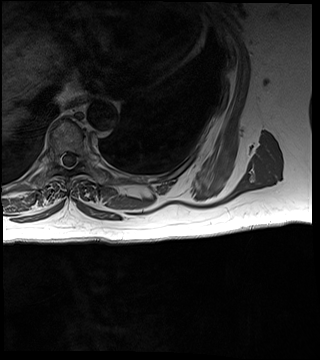
[im 39/39]
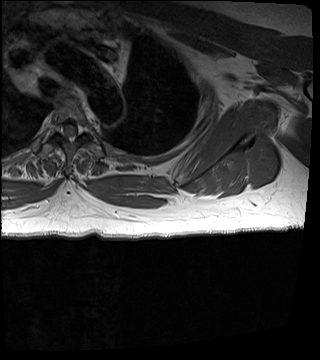

[Series 7: T1 · coronal · left · 4.0mm · 0.94mm/px · 2 of 18 slices shown (2 of 2)]
[im 1/18]
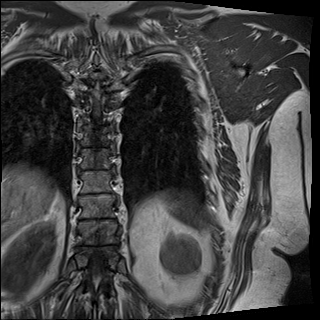
[im 18/18]
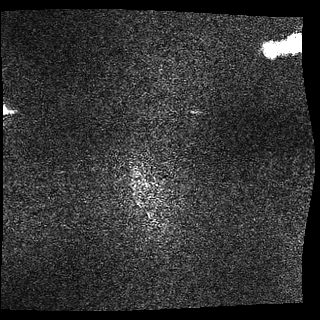

[Series 8: T2 fat-sat · coronal · left · 4.0mm · 0.94mm/px · 1 of 18 slices shown (2 of 2)]
[im 1/18]
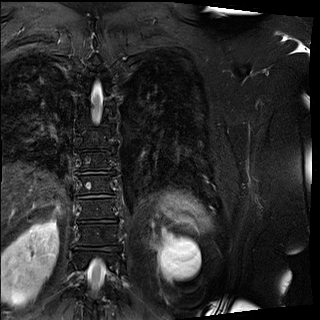

[Series 9: STIR · sagittal · left · 4.0mm · 0.94mm/px · 1 of 22 slices shown]
[im 1/22]
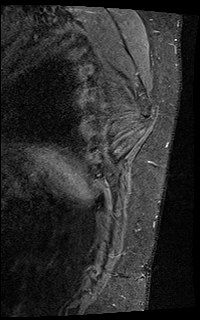

[Series 10: T1 fat-sat · axial · left · 4.0mm · 0.78mm/px · z∈[-98,+84]mm · 2 of 39 slices shown]
[im 1/39]
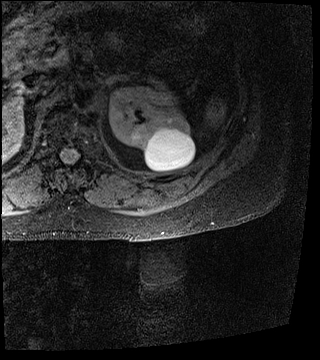
[im 39/39]
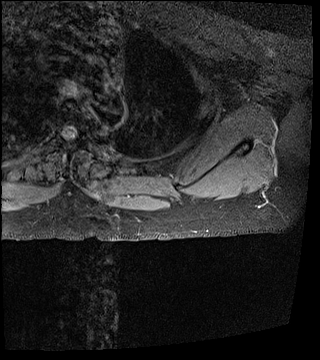

[Series 11: T1 fat-sat post-contrast · axial · left · 4.0mm · 0.78mm/px · z∈[-98,+84]mm · 2 of 39 slices shown (1 of 3)]
[im 1/39]
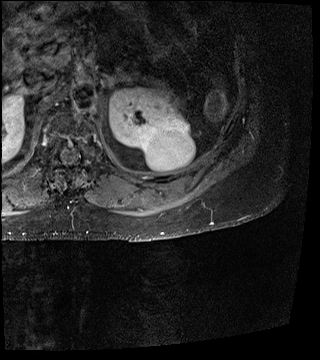
[im 39/39]
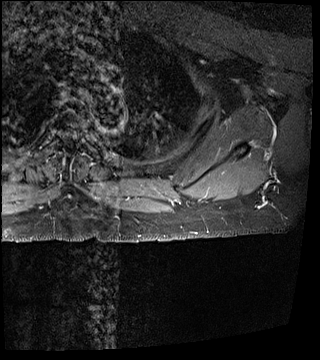

[Series 12: T1 fat-sat post-contrast · sagittal · left · 4.0mm · 0.78mm/px · 1 of 22 slices shown (2 of 3)]
[im 1/22]
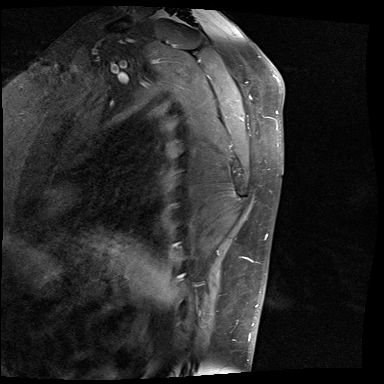

[Series 13: T1 fat-sat post-contrast · coronal · left · 4.0mm · 0.78mm/px · 1 of 18 slices shown (3 of 3)]
[im 1/18]
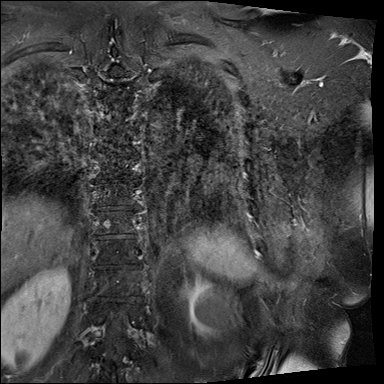

[16 of 16 positions shown; findings below may reference images not displayed]

FINDINGS: In the left erector spinae muscle we demonstrate a 6.1 by 2.3 by
cm fatty mass with some muscle bundles tracking within the mass
longitudinally. There is no abnormal discrete nodularity or degree
of enhancement separate from that of the surrounding muscle bundles.
The lesion is oval-shaped and not dumbbell-shaped. No well-defined
capsule. The vertical excursion of the lesion is about from the
vertical level of the eighth rib to the vertical level of the
eleventh rib.

Bilateral renal cysts are present. The upper margin of a left kidney
upper pole lesion with homogeneous high T2 and high T1 signal
characteristics is observed to measure up to 449 units in signal
intensity on post-contrast images compared to 325 units on
precontrast images, raising concern for enhancement and possible
left kidney upper pole mass. This is new or progressed in size
compared to the prior ultrasound from [DATE].

Small T9 hemangioma eccentric to the right.
IMPRESSION: 1. Infiltrating intramuscular lipoma in the left erector spinae
muscle, without nodularity, abnormal septal enhancement, abnormal
dumbbell-shaped, or other specific indicators of low-grade
liposarcoma. Given this appearance, the lesion is highly likely to
be benign. However, if enlarging or causing pain, resection may be
warranted.
2. A left kidney upper pole posterior lesion is likely enhancing and
is new or increased in size compared to prior ultrasound. A renal
cell carcinoma is a distinct possibility. I recommend renal protocol
CT or MRI with and without contrast for further characterization.

## 2020-03-16 MED ORDER — GADOBENATE DIMEGLUMINE 529 MG/ML IV SOLN
20.0000 mL | Freq: Once | INTRAVENOUS | Status: AC | PRN
  Administered 2020-03-16: 20 mL via INTRAVENOUS

## 2020-03-17 ENCOUNTER — Encounter: Payer: Self-pay | Admitting: Internal Medicine

## 2020-03-17 DIAGNOSIS — N2889 Other specified disorders of kidney and ureter: Secondary | ICD-10-CM

## 2020-03-19 ENCOUNTER — Encounter: Payer: Self-pay | Admitting: Internal Medicine

## 2020-03-20 ENCOUNTER — Other Ambulatory Visit: Payer: Self-pay

## 2020-03-20 MED ORDER — ROSUVASTATIN CALCIUM 5 MG PO TABS: 5.0000 mg | ORAL_TABLET | Freq: Every day | ORAL | 1 refills | Status: DC

## 2020-03-23 ENCOUNTER — Other Ambulatory Visit: Payer: BC Managed Care – PPO

## 2020-03-24 DIAGNOSIS — D179 Benign lipomatous neoplasm, unspecified: Secondary | ICD-10-CM | POA: Diagnosis not present

## 2020-03-24 DIAGNOSIS — D49512 Neoplasm of unspecified behavior of left kidney: Secondary | ICD-10-CM | POA: Diagnosis not present

## 2020-03-28 DIAGNOSIS — K7689 Other specified diseases of liver: Secondary | ICD-10-CM | POA: Diagnosis not present

## 2020-03-28 DIAGNOSIS — N2889 Other specified disorders of kidney and ureter: Secondary | ICD-10-CM | POA: Diagnosis not present

## 2020-03-28 DIAGNOSIS — I700 Atherosclerosis of aorta: Secondary | ICD-10-CM | POA: Diagnosis not present

## 2020-03-28 DIAGNOSIS — D49512 Neoplasm of unspecified behavior of left kidney: Secondary | ICD-10-CM | POA: Diagnosis not present

## 2020-03-28 DIAGNOSIS — N281 Cyst of kidney, acquired: Secondary | ICD-10-CM | POA: Diagnosis not present

## 2020-03-31 ENCOUNTER — Other Ambulatory Visit (HOSPITAL_COMMUNITY): Payer: Self-pay | Admitting: Urology

## 2020-03-31 ENCOUNTER — Other Ambulatory Visit: Payer: Self-pay | Admitting: Urology

## 2020-03-31 DIAGNOSIS — D49512 Neoplasm of unspecified behavior of left kidney: Secondary | ICD-10-CM

## 2020-04-08 ENCOUNTER — Encounter: Payer: Self-pay | Admitting: Internal Medicine

## 2020-04-10 ENCOUNTER — Ambulatory Visit (HOSPITAL_COMMUNITY)
Admission: RE | Admit: 2020-04-10 | Discharge: 2020-04-10 | Disposition: A | Discharge status: Home / Self Care | Payer: BC Managed Care – PPO | Source: Ambulatory Visit | Attending: Urology | Admitting: Urology

## 2020-04-10 ENCOUNTER — Other Ambulatory Visit: Payer: Self-pay

## 2020-04-10 ENCOUNTER — Ambulatory Visit (HOSPITAL_COMMUNITY): Admission: RE | Admit: 2020-04-10 | Payer: BC Managed Care – PPO | Source: Ambulatory Visit

## 2020-04-10 ENCOUNTER — Other Ambulatory Visit (INDEPENDENT_AMBULATORY_CARE_PROVIDER_SITE_OTHER): Payer: Medicare Other

## 2020-04-10 DIAGNOSIS — N2889 Other specified disorders of kidney and ureter: Secondary | ICD-10-CM | POA: Diagnosis not present

## 2020-04-10 DIAGNOSIS — Z9049 Acquired absence of other specified parts of digestive tract: Secondary | ICD-10-CM | POA: Diagnosis not present

## 2020-04-10 DIAGNOSIS — N281 Cyst of kidney, acquired: Secondary | ICD-10-CM | POA: Diagnosis not present

## 2020-04-10 DIAGNOSIS — E7849 Other hyperlipidemia: Secondary | ICD-10-CM | POA: Diagnosis not present

## 2020-04-10 DIAGNOSIS — D49512 Neoplasm of unspecified behavior of left kidney: Secondary | ICD-10-CM | POA: Diagnosis not present

## 2020-04-10 DIAGNOSIS — K760 Fatty (change of) liver, not elsewhere classified: Secondary | ICD-10-CM | POA: Diagnosis not present

## 2020-04-10 LAB — HEPATIC FUNCTION PANEL
ALT: 26 U/L (ref 0–35)
AST: 17 U/L (ref 0–37)
Albumin: 4.1 g/dL (ref 3.5–5.2)
Alkaline Phosphatase: 80 U/L (ref 39–117)
Bilirubin, Direct: 0 mg/dL (ref 0.0–0.3)
Total Bilirubin: 0.8 mg/dL (ref 0.2–1.2)
Total Protein: 7 g/dL (ref 6.0–8.3)

## 2020-04-10 LAB — LIPID PANEL
Cholesterol: 163 mg/dL (ref 0–200)
HDL: 55.1 mg/dL (ref >39.00)
LDL Cholesterol: 85 mg/dL (ref 0–99)
NonHDL: 108.25
Total CHOL/HDL Ratio: 3
Triglycerides: 115 mg/dL (ref 0.0–149.0)
VLDL: 23 mg/dL (ref 0.0–40.0)

## 2020-04-10 IMAGING — MR MR ABDOMEN WO/W CM
18 series · 48 of 48 positions shown · IV contrast (with contrast)
Comparison: CT on [DATE] from [HOSPITAL]

CLINICAL DATA: Follow-up indeterminate bilateral renal lesions on
recent CT.

EXAM:
MRI ABDOMEN WITHOUT AND WITH CONTRAST
TECHNIQUE: Multiplanar multisequence MR imaging of the abdomen was performed
both before and after the administration of intravenous contrast.
CONTRAST:  10mL GADAVIST GADOBUTROL 1 MMOL/ML IV SOLN

[Series 2: haste_cor_mbh · coronal · 6.0mm · 1.56mm/px · 2 of 39 slices shown]
[im 1/39]
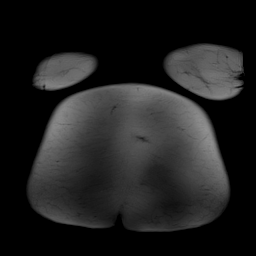
[im 39/39]
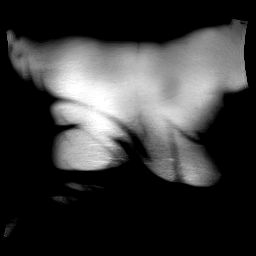

[Series 3: ax_trufi_mbh · axial · 6.0mm · 1.15mm/px · z∈[-137,+121]mm · 2 of 44 slices shown]
[im 1/44]
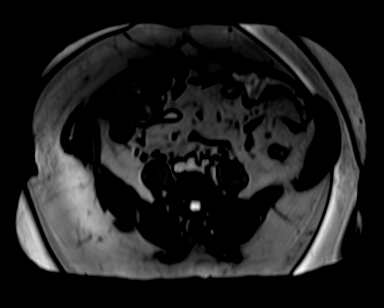
[im 44/44]
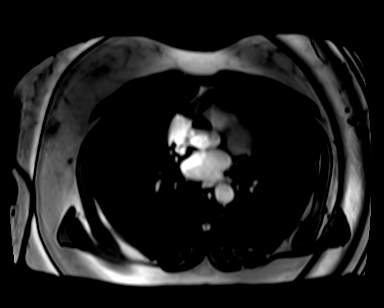

[Series 4: T2 fat-sat · axial · 6.0mm · 1.25mm/px · z∈[-137,+143]mm · 2 of 40 slices shown]
[im 1/40]
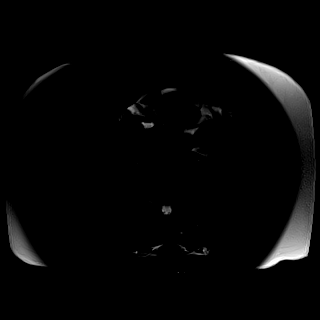
[im 40/40]
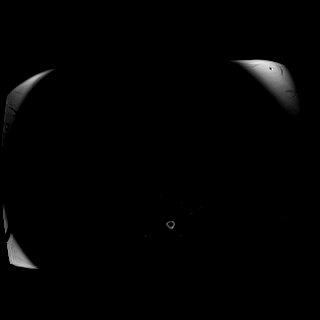

[Series 6: ax_diff_fb_tracew_dfc_mix · axial · 6.0mm · 1.64mm/px · z∈[-197,+141]mm · 3 of 96 slices shown]
[im 1/96]
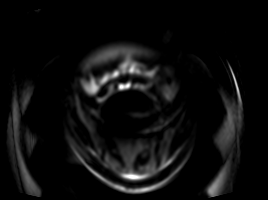
[im 48/96]
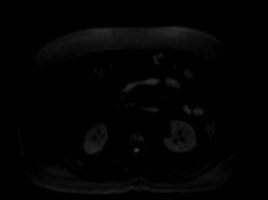
[im 96/96]
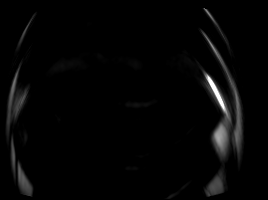

[Series 7: ax_diff_fb_adc_dfc_mix · axial · 6.0mm · 1.64mm/px · z∈[-197,+141]mm · 2 of 48 slices shown]
[im 1/48]
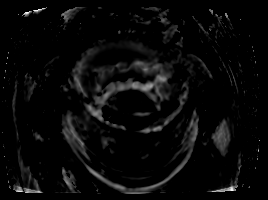
[im 48/48]
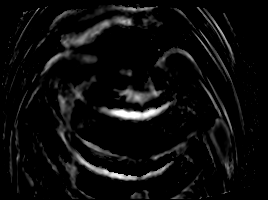

[Series 8: t1_vibe_e-dixon_tra_bh_pre_opp · axial · 3.0mm · 2.29mm/px · z∈[-166,+119]mm · 3 of 96 slices shown]
[im 1/96]
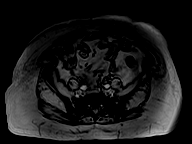
[im 48/96]
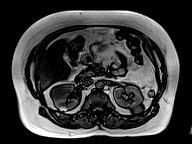
[im 96/96]
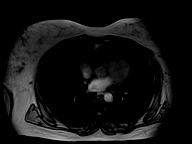

[Series 9: t1_vibe_e-dixon_tra_bh_pre_in · axial · 3.0mm · 2.29mm/px · z∈[-166,+119]mm · 3 of 96 slices shown]
[im 1/96]
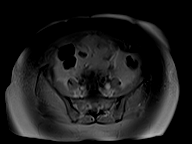
[im 48/96]
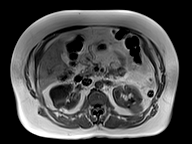
[im 96/96]
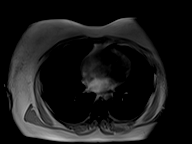

[Series 11: t1_vibe_e-dixon_tra_bh_pre_w · axial · 3.0mm · 2.29mm/px · z∈[-166,+119]mm · 3 of 96 slices shown]
[im 1/96]
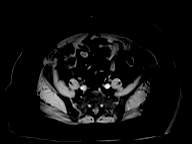
[im 48/96]
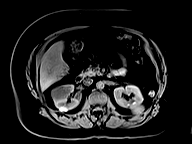
[im 96/96]
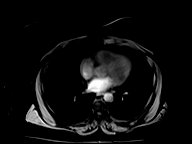

[Series 19: t1_vibe_dixon_tra_bh_arterial_w_reg · axial · 3.0mm · 2.29mm/px · z∈[-166,+119]mm · 3 of 96 slices shown]
[im 1/96]
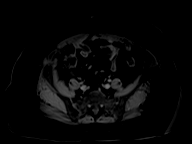
[im 48/96]
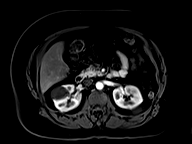
[im 96/96]
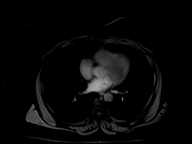

[Series 20: t1_vibe_dixon_tra_bh_arterial_w_sub · axial · 3.0mm · 2.29mm/px · z∈[-166,+119]mm · 3 of 96 slices shown]
[im 1/96]
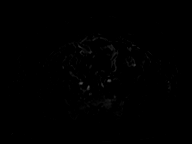
[im 48/96]
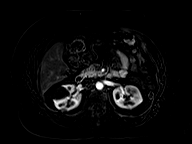
[im 96/96]
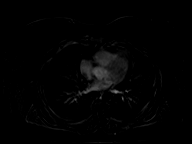

[Series 21: t1_vibe_dixon_tra_bh_venous_w_reg · axial · 3.0mm · 2.29mm/px · z∈[-166,+119]mm · 3 of 96 slices shown]
[im 1/96]
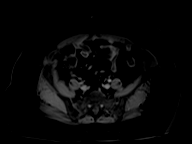
[im 48/96]
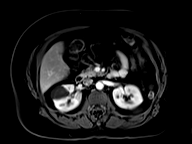
[im 96/96]
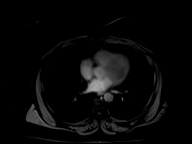

[Series 22: t1_vibe_dixon_tra_bh_venous_w_r_sub · axial · 3.0mm · 2.29mm/px · z∈[-166,+119]mm · 3 of 96 slices shown]
[im 1/96]
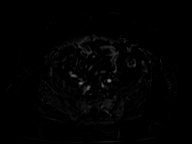
[im 48/96]
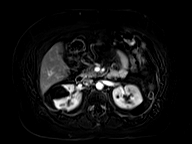
[im 96/96]
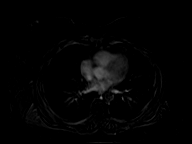

[Series 23: T2 · axial · 6.0mm · 1.56mm/px · 1 of 37 slices shown]
[im 1/37]
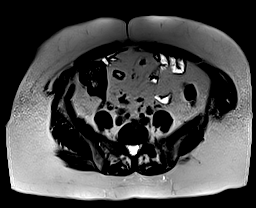

[Series 24: t1_vibe_dixon_tra_bh_delayed_w_reg · axial · 3.0mm · 2.29mm/px · z∈[-166,+119]mm · 3 of 96 slices shown]
[im 1/96]
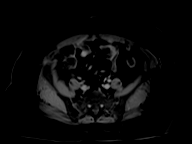
[im 48/96]
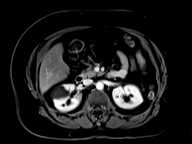
[im 96/96]
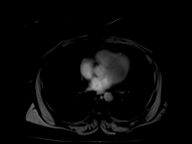

[Series 25: t1_vibe_dixon_tra_bh_delayed_w__sub · axial · 3.0mm · 2.29mm/px · z∈[-166,+119]mm · 3 of 96 slices shown]
[im 1/96]
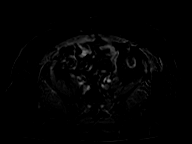
[im 48/96]
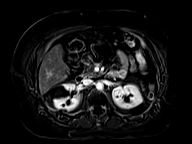
[im 96/96]
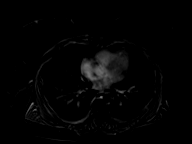

[Series 27: t1_vibe_dixon_cor_bh_post_w · coronal · 4.0mm · 2.34mm/px · 3 of 80 slices shown]
[im 1/80]
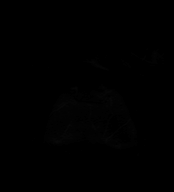
[im 40/80]
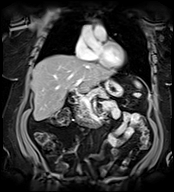
[im 80/80]
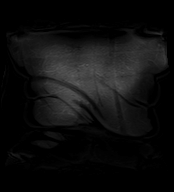

[Series 28: t1_vibe_dixon_tra_bh_3 min_w_reg · axial · 3.0mm · 2.29mm/px · z∈[-166,+119]mm · 3 of 96 slices shown]
[im 1/96]
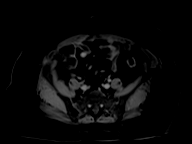
[im 48/96]
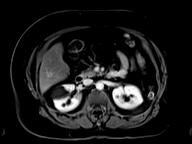
[im 96/96]
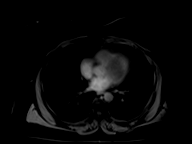

[Series 29: t1_vibe_dixon_tra_bh_3 min_w_re_sub · axial · 3.0mm · 2.29mm/px · z∈[-166,+119]mm · 3 of 96 slices shown]
[im 1/96]
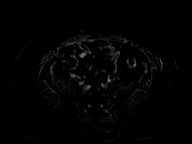
[im 48/96]
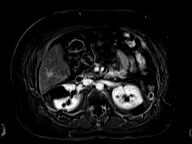
[im 96/96]
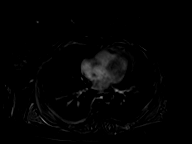

[48 of 48 positions shown; findings below may reference images not displayed]

FINDINGS: Lower chest: No acute findings.

Hepatobiliary: No hepatic masses identified. Diffuse hepatic
steatosis is demonstrated on chemical shift imaging. Prior
cholecystectomy. No evidence of biliary obstruction.

Pancreas:  No mass or inflammatory changes.

Spleen:  Within normal limits in size and appearance.

Adrenals/Urinary Tract: Normal adrenal glands. Multiple benign
Bosniak category 1 cysts are seen in both kidneys. Several benign
Bosniak category 2 hemorrhagic cysts are also seen in both kidneys,
which show T1 hyperintensity and lack of contrast enhancement. These
include the subcapsular lesion in the lateral upper pole the left
kidney and the 1.6 cm subcapsular lesion in the interpolar region of
the right kidney which or the lesions of concern on the prior CT.

A complex cystic lesion containing multiple thin internal septations
is seen in the midpole of the right kidney which measures 4.5 x
cm, but shows no evidence of contrast enhancement. This is best
characterized as a Bosniak category 2 F lesion. No evidence of
hydronephrosis.

Stomach/Bowel: Visualized portion unremarkable.

Vascular/Lymphatic: No pathologically enlarged lymph nodes
identified. No abdominal aortic aneurysm.

Other:  None.

Musculoskeletal:  No suspicious bone lesions identified.
IMPRESSION: Multiple benign Bosniak category 1 and 2 renal cysts, which
correspond to lesions of concern on prior CT.

4.5 cm indeterminate but usually benign Bosniak category 2 F cystic
lesion in the midpole the right kidney. Recommend continued
follow-up by MRI in 6 months. This recommendation follows ACR
consensus guidelines: Management of the Incidental Renal Mass on CT:
A White Paper of the ACR Incidental Findings Committee. [HOSPITAL] [RL];[DATE].

Diffuse hepatic steatosis.

## 2020-04-10 MED ORDER — GADOBUTROL 1 MMOL/ML IV SOLN
10.0000 mL | Freq: Once | INTRAVENOUS | Status: AC | PRN
  Administered 2020-04-10: 10 mL via INTRAVENOUS

## 2020-04-10 NOTE — Addendum Note (Signed)
Addended by: Jacob Moores on: 04/10/2020 07:42 AM   Modules accepted: Orders

## 2020-04-11 DIAGNOSIS — D179 Benign lipomatous neoplasm, unspecified: Secondary | ICD-10-CM | POA: Diagnosis not present

## 2020-04-11 DIAGNOSIS — N281 Cyst of kidney, acquired: Secondary | ICD-10-CM | POA: Diagnosis not present

## 2020-04-17 ENCOUNTER — Other Ambulatory Visit (HOSPITAL_COMMUNITY): Payer: Self-pay | Admitting: Urology

## 2020-04-17 DIAGNOSIS — N281 Cyst of kidney, acquired: Secondary | ICD-10-CM

## 2020-04-21 ENCOUNTER — Encounter: Payer: Self-pay | Admitting: Internal Medicine

## 2020-05-25 ENCOUNTER — Other Ambulatory Visit: Payer: Self-pay | Admitting: Internal Medicine

## 2020-05-26 ENCOUNTER — Other Ambulatory Visit: Payer: Self-pay | Admitting: Internal Medicine

## 2020-05-26 ENCOUNTER — Encounter: Payer: Self-pay | Admitting: Internal Medicine

## 2020-05-26 DIAGNOSIS — Z1231 Encounter for screening mammogram for malignant neoplasm of breast: Secondary | ICD-10-CM

## 2020-05-27 MED ORDER — ROSUVASTATIN CALCIUM 5 MG PO TABS: 5.0000 mg | ORAL_TABLET | Freq: Every day | ORAL | 1 refills | Status: DC

## 2020-05-27 MED ORDER — DILTIAZEM HCL ER COATED BEADS 240 MG PO CP24: 240.0000 mg | ORAL_CAPSULE | Freq: Every day | ORAL | 1 refills | Status: DC

## 2020-05-27 MED ORDER — LISINOPRIL-HYDROCHLOROTHIAZIDE 20-25 MG PO TABS: 1.0000 | ORAL_TABLET | Freq: Every day | ORAL | 0 refills | Status: DC

## 2020-06-05 ENCOUNTER — Other Ambulatory Visit: Payer: Self-pay

## 2020-06-05 ENCOUNTER — Ambulatory Visit
Admission: RE | Admit: 2020-06-05 | Discharge: 2020-06-05 | Disposition: A | Discharge status: Home / Self Care | Payer: BC Managed Care – PPO | Source: Ambulatory Visit

## 2020-06-05 DIAGNOSIS — Z1231 Encounter for screening mammogram for malignant neoplasm of breast: Secondary | ICD-10-CM

## 2020-06-05 IMAGING — MG DIGITAL SCREENING BILAT W/ TOMO W/ CAD
8 series · 8 of 24 positions shown · non-contrast
Comparison: Previous exam(s).

CLINICAL DATA: Screening.

EXAM:
DIGITAL SCREENING BILATERAL MAMMOGRAM WITH TOMO AND CAD

[R MLO synth-2D]
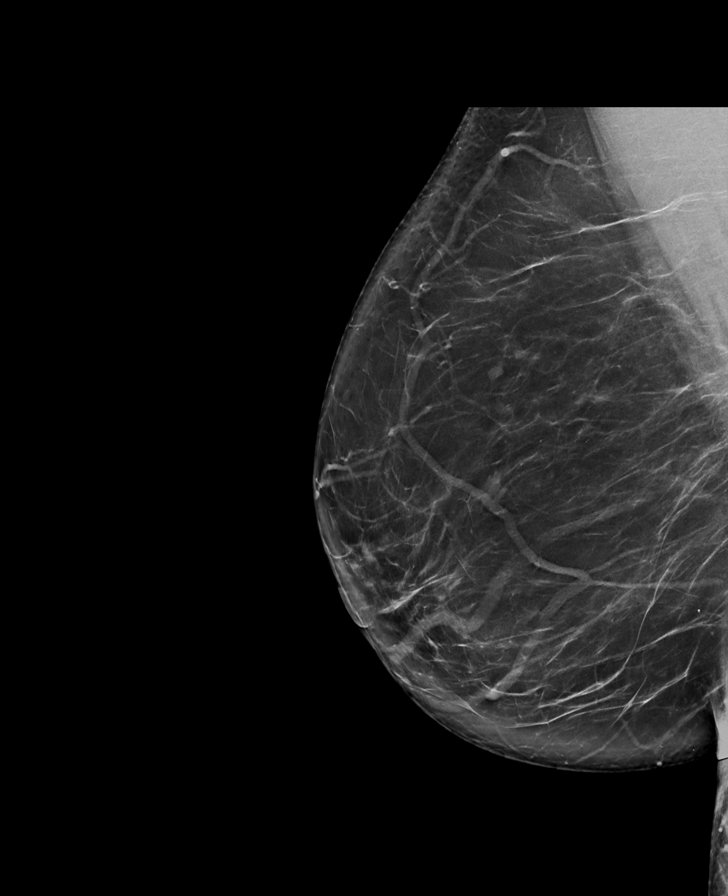

[L CC synth-2D]
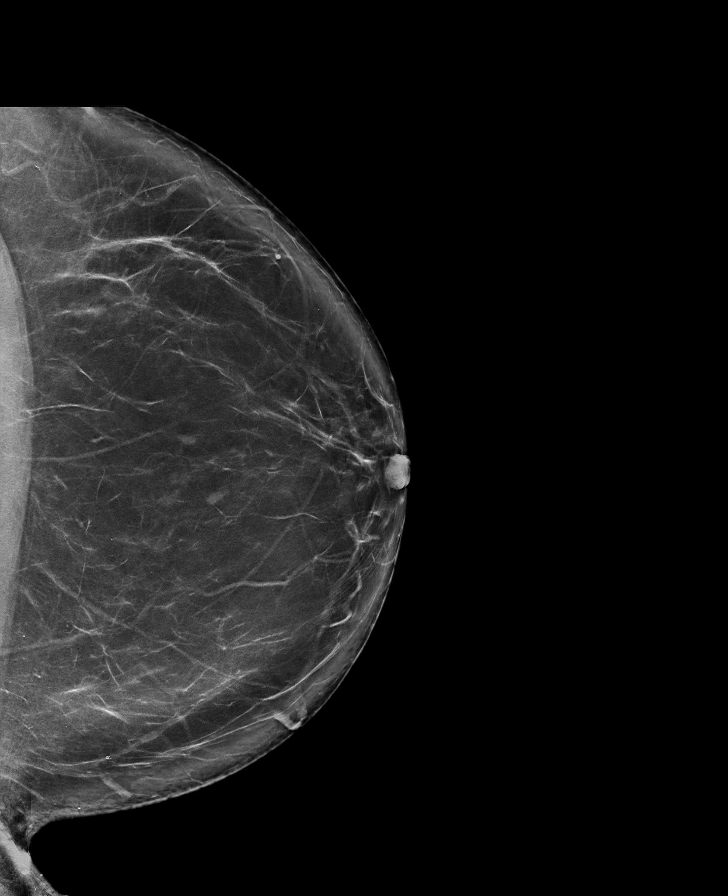

[L MLO synth-2D]
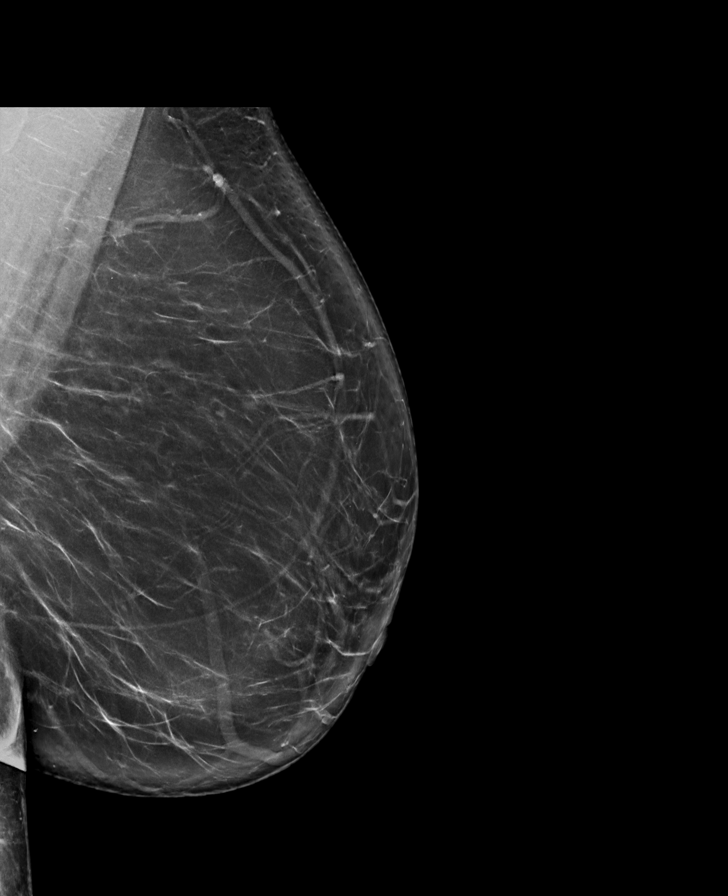

[R CC synth-2D]
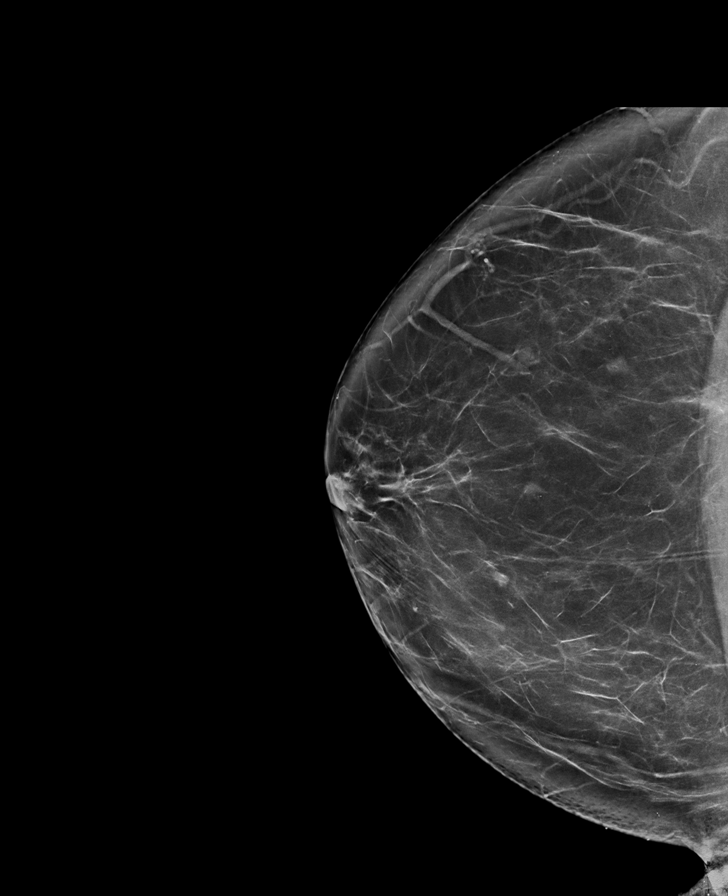

[L MLO tomo · tomo slice 50/99.0]
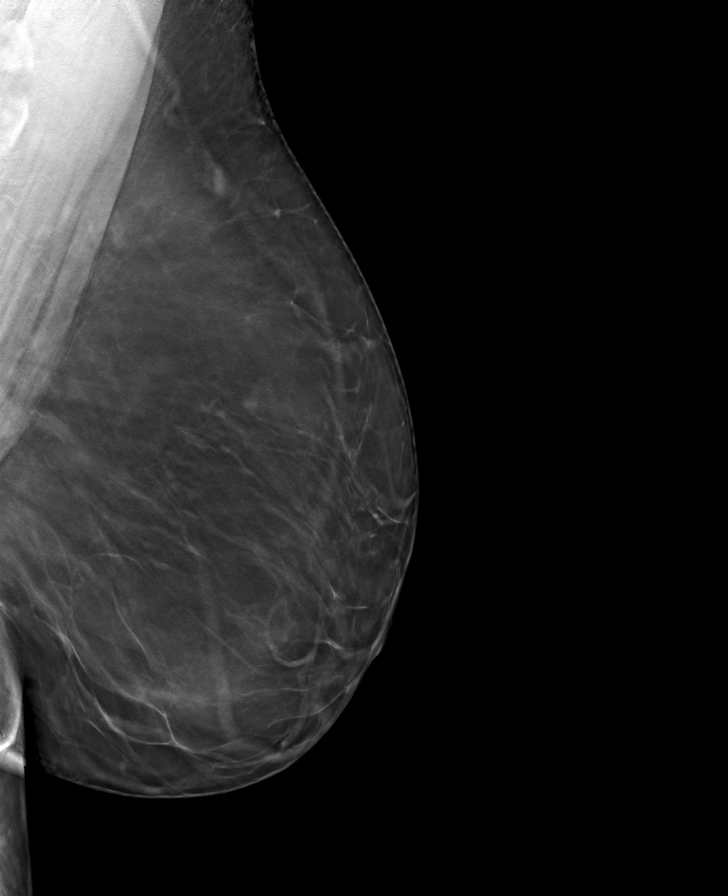

[L CC tomo · tomo slice 49/96.0]
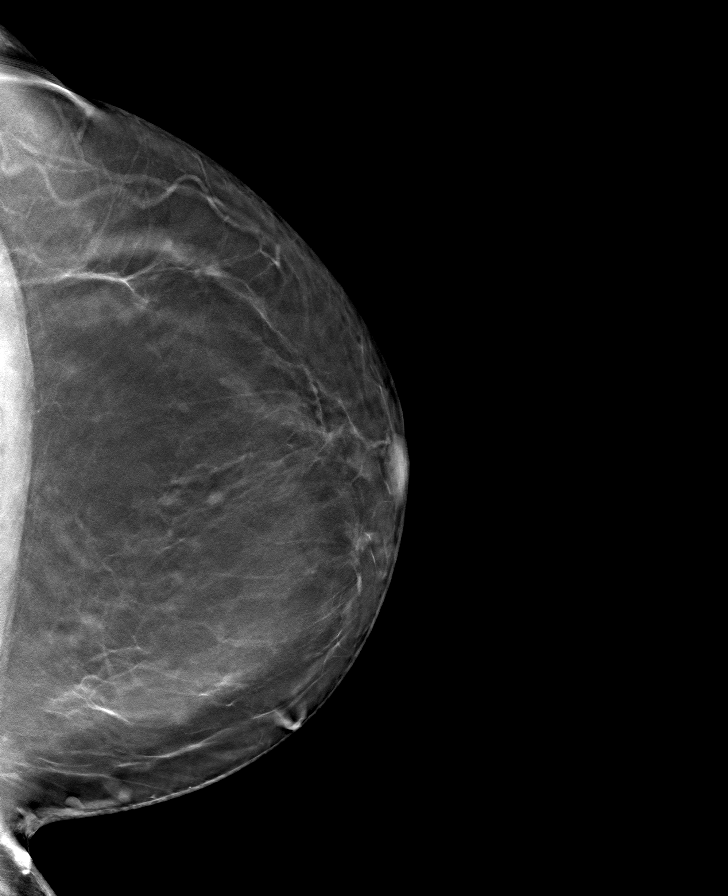

[R MLO tomo · tomo slice 49/96.0]
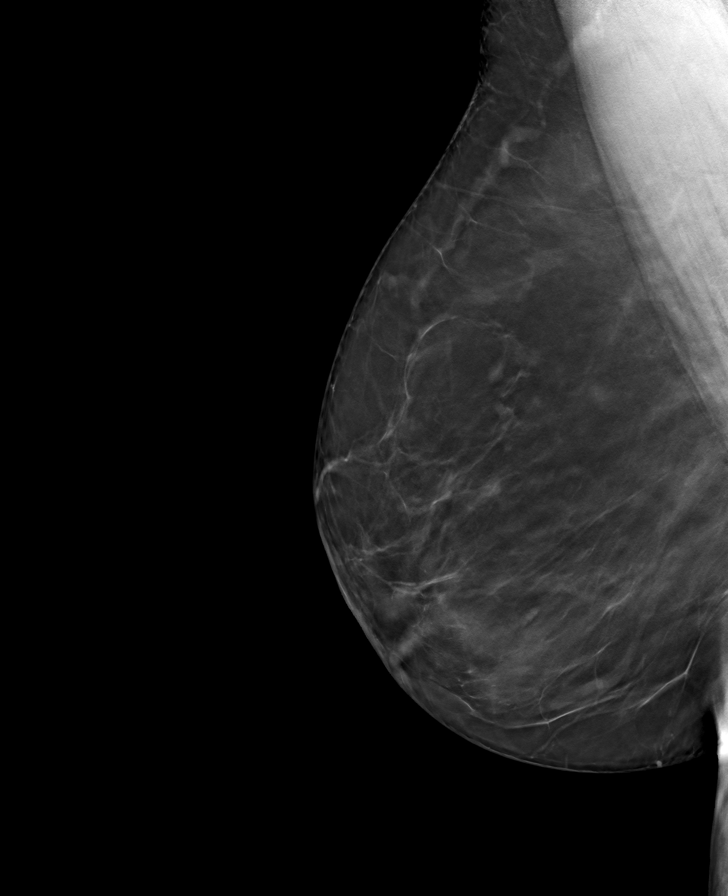

[R CC tomo · tomo slice 46/91.0]
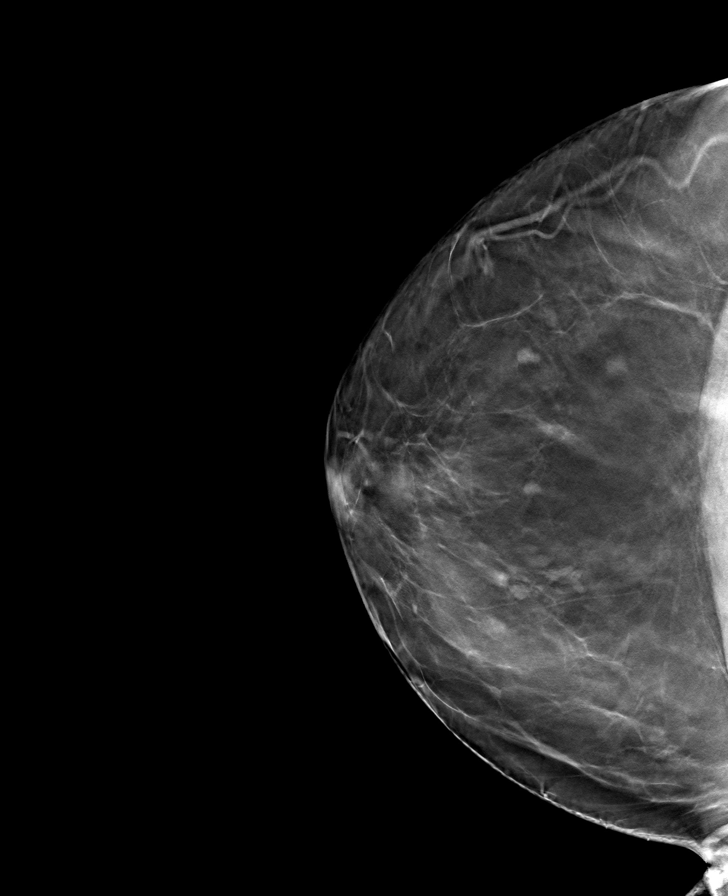

[8 of 24 positions shown; findings below may reference images not displayed]

ACR Breast Density Category b: There are scattered areas of
fibroglandular density.
FINDINGS: There are no findings suspicious for malignancy. Images were
processed with CAD.
IMPRESSION: No mammographic evidence of malignancy. A result letter of this
screening mammogram will be mailed directly to the patient.

RECOMMENDATION:
Screening mammogram in one year. (Code:[TQ])

BI-RADS CATEGORY  1: Negative.

## 2020-06-15 ENCOUNTER — Encounter: Payer: Self-pay | Admitting: Internal Medicine

## 2020-06-16 ENCOUNTER — Encounter: Payer: Self-pay | Admitting: Internal Medicine

## 2020-06-16 ENCOUNTER — Telehealth (INDEPENDENT_AMBULATORY_CARE_PROVIDER_SITE_OTHER): Payer: BC Managed Care – PPO | Admitting: Internal Medicine

## 2020-06-16 ENCOUNTER — Other Ambulatory Visit: Payer: Self-pay

## 2020-06-16 DIAGNOSIS — K219 Gastro-esophageal reflux disease without esophagitis: Secondary | ICD-10-CM

## 2020-06-16 MED ORDER — PANTOPRAZOLE SODIUM 40 MG PO TBEC: 40.0000 mg | DELAYED_RELEASE_TABLET | Freq: Every day | ORAL | 3 refills | Status: DC

## 2020-06-16 NOTE — Progress Notes (Signed)
Virtual Visit via telephone note  I connected with Tami Hughes on 06/16/20 at 11:00 AM EST by telephone and verified that I am speaking with the correct person using two identifiers.   I discussed the limitations of evaluation and management by telemedicine and the availability of in person appointments. The patient expressed understanding and agreed to proceed.  Present for the visit:  Myself, Dr Billey Gosling, [name].  The patient is currently at home and I am in the office.    No referring provider.    History of Present Illness: This is an acute visit for GERD symptoms.  She has been experiencing heartburn for a while, but recently has gotten worse.  She is eating very healthy and as some of these healthy foods have increased in her diet her GERD symptoms have worsened.  She is eating a lot of salads, citrus fruits and even nuts.  She is experiencing GERD with most of these foods.  She is not having any abdominal pain.  She does have a history of heartburn and was on medication in the past-both Zantac and pantoprazole at 1 point.  She is tried several natural remedies including baking soda and water and changes in her lifestyle without improvement in her symptoms.  She would like to avoid medication, but at this time she feels like she needs something to help.  She is avoiding laying down too soon after eating and is avoiding eating late.  She is not drinking excessive caffeine and is not taking NSAIDs on a regular basis.  She was concerned about the possible side effects of the medication.    Social History   Socioeconomic History  . Marital status: Widowed    Spouse name: Not on file  . Number of children: 2  . Years of education: 38  . Highest education level: Not on file  Occupational History  . Occupation: Office manager: [employer]  Tobacco Use  . Smoking status: Never Smoker  . Smokeless tobacco: Never Used  Substance and Sexual Activity  . Alcohol use: Yes     Alcohol/week: 2.0 standard drinks    Types: 2 Standard drinks or equivalent per week    Comment: wine on weekends  . Drug use: No  . Sexual activity: Yes    Partners: Male  Other Topics Concern  . Not on file  Social History Narrative   HSG, [employer] - BA, [employer] - masters in Energy manager. Married '74 - 27 yrs/widowed. 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 Has high potential for work but will finish master's first. Lives alone.       CPA   Widow, single   2 children, 2 grandchildren   No regular exercise   1 large cup of coffee daily   Social Determinants of Health   Financial Resource Strain: Not on file  Food Insecurity: Not on file  Transportation Needs: Not on file  Physical Activity: Not on file  Stress: Not on file  Social Connections: Not on file        Assessment and Plan:  See Problem List for Assessment and Plan of chronic medical problems.   Follow Up Instructions:    I discussed the assessment and treatment plan with the patient. The patient was provided an opportunity to ask questions and all were answered. The patient agreed with the plan and demonstrated an understanding of the instructions.   The patient was advised to  call back or seek an in-person evaluation if the symptoms worsen or if the condition fails to improve as anticipated.  Time spent on telephone call: 10 minutes.  Binnie Rail, MD

## 2020-06-16 NOTE — Assessment & Plan Note (Signed)
Acute on chronic Has had some intermittent GERD symptoms for a while, but she has been able to control them.  Her symptoms have worsened and natural remedies and changes in lifestyle and not effective She think she needs to take something although would rather not. We discussed Pepcid versus pantoprazole or Nexium-discussed side effects of both We will try pantoprazole 40 mg daily-hopefully she can take this for couple of weeks and improve her symptoms and then can stop it After that plan to try to transition to Pepcid over-the-counter and hopefully eventually she can come off of that as well She will let me know she has any concerns or questions

## 2020-08-21 ENCOUNTER — Encounter: Payer: Self-pay | Admitting: Internal Medicine

## 2020-08-21 ENCOUNTER — Other Ambulatory Visit: Payer: Self-pay

## 2020-08-21 NOTE — Progress Notes (Signed)
Subjective:    Patient ID: Tami Hughes, female    DOB: 1956-04-18, 65 y.o.   MRN: [id number]  HPI The patient is here for follow up of their chronic medical problems, including htn, GERD, hyperlipidemia, prediabetes, obesity  BP at home 138/78 on average at home - her cuff is similar to ours.   She is gaining weight.  She is still working from home and is still in her doctorate program.   She works 10-12 hrs a day in addition to school.  She is exercising.  She knows she is probably snacking too much-she often does that to get through her doctor at work.   Still with lump in back - no change in size.  She denies pain.  She has an MRI scheduled to follow-up on the kidney lesion.  Medications and allergies reviewed with patient and updated if appropriate.  Patient Active Problem List   Diagnosis Date Noted  . Lump of skin of back 02/01/2020  . Prediabetes 11/11/2016  . Herpes simplex without mention of complication 11/91/4782  . Obesity, Class II, BMI 35-39.9 05/02/2012  . Personal history of colonic polyps 01/27/2012  . VENOUS INSUFFICIENCY, LEGS 08/28/2010  . Hyperlipidemia 02/22/2008  . Essential hypertension 02/22/2008  . GERD 02/22/2008    Current Outpatient Medications on File Prior to Visit  Medication Sig Dispense Refill  . diltiazem (CARDIZEM CD) 240 MG 24 hr capsule Take 1 capsule (240 mg total) by mouth daily. 90 capsule 1  . lisinopril-hydrochlorothiazide (ZESTORETIC) 20-25 MG tablet Take 1 tablet by mouth daily. 90 tablet 0  . pantoprazole (PROTONIX) 40 MG tablet Take 1 tablet (40 mg total) by mouth daily. Take 30 minutes prior to a meal. 30 tablet 3  . rosuvastatin (CRESTOR) 5 MG tablet Take 1 tablet (5 mg total) by mouth daily. 90 tablet 1  . triamcinolone cream (KENALOG) 0.1 % Apply 1 application topically 2 (two) times daily. 30 g 0   No current facility-administered medications on file prior to visit.    Past Medical History:  Diagnosis Date  . Allergy    . Arthritis   . Dyslipidemia   . GERD (gastroesophageal reflux disease)   . Hyperlipidemia   . Hypertension   . Polyp of colon 2009   adenoma benign  . Shingles    recurrent: 08/2010, 10/2013    Past Surgical History:  Procedure Laterality Date  . ABDOMINAL HYSTERECTOMY     TAH  . CHOLECYSTECTOMY N/A 12/18/2014   Procedure: LAPAROSCOPIC CHOLECYSTECTOMY WITH INTRAOPERATIVE CHOLANGIOGRAM;  Surgeon: Johnathan Hausen, MD;  Location: [facility];  Service: General;  Laterality: N/A;  . COLONOSCOPY    . LABIOPLASTY    . POLYPECTOMY    . PUBOVAGINAL SLING  05/08/2010   stress urinary incontinence  . VULVECTOMY     partial vulvectomy secondary to labial hypertrophy..    Social History   Socioeconomic History  . Marital status: Widowed    Spouse name: Not on file  . Number of children: 2  . Years of education: 60  . Highest education level: Not on file  Occupational History  . Occupation: Office manager: [employer]  Tobacco Use  . Smoking status: Never Smoker  . Smokeless tobacco: Never Used  Substance and Sexual Activity  . Alcohol use: Yes    Alcohol/week: 2.0 standard drinks    Types: 2 Standard drinks or equivalent per week    Comment: wine on weekends  . Drug use: No  .  Sexual activity: Yes    Partners: Male  Other Topics Concern  . Not on file  Social History Narrative   HSG, [employer] - BA, [employer] - masters in Energy manager. Married '74 - 27 yrs/widowed. 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 Has high potential for work but will finish master's first. 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 Kidney disease Mother        had transplant  . Breast cancer Mother   . Heart disease Sister   . Heart attack Sister   . Colon cancer Neg Hx   . Esophageal cancer Neg Hx   . Rectal cancer Neg Hx   . Stomach cancer Neg Hx   . Diabetes Neg Hx   . Hyperlipidemia Neg Hx   . COPD Neg Hx   . Colon polyps Neg Hx     Review of Systems  Constitutional: Negative for fever.  HENT: Negative for trouble swallowing.   Respiratory: Negative for cough, shortness of breath and wheezing.   Cardiovascular: Negative for chest pain, palpitations and leg swelling.  Gastrointestinal: Negative for abdominal pain and nausea.  Neurological: Positive for headaches (weather related). Negative for light-headedness.       Objective:   Vitals:   08/22/20 0751  BP: (!) 142/82  Pulse: 62  Temp: 98.3 F (36.8 C)  SpO2: 99%   BP Readings from Last 3 Encounters:  08/22/20 (!) 142/82  02/12/20 138/78  02/01/20 (!) 174/100   Wt Readings from Last 3 Encounters:  08/22/20 224 lb (101.6 kg)  02/12/20 218 lb 3.2 oz (99 kg)  02/01/20 217 lb (98.4 kg)   Body mass index is 39.68 kg/m.   Physical Exam    Constitutional: Appears well-developed and well-nourished. No distress.  HENT:  Head: Normocephalic and atraumatic.  Neck: Neck supple. No tracheal deviation present. No thyromegaly present.  No cervical lymphadenopathy Cardiovascular: Normal rate, regular rhythm and normal heart sounds.   No murmur heard. No carotid bruit .  No edema Pulmonary/Chest: Effort normal and breath sounds normal. No respiratory distress. No has no wheezes. No rales.  Skin: Skin is warm and dry. Not diaphoretic.  Psychiatric: Normal mood and affect. Behavior is normal.      Assessment & Plan:    See Problem List for Assessment and Plan of chronic medical problems.    This visit occurred during the SARS-CoV-2 public health emergency.  Safety protocols were in place, including  screening questions prior to the visit, additional usage of staff PPE, and extensive cleaning of exam room while observing appropriate contact time as indicated for disinfecting solutions.

## 2020-08-21 NOTE — Patient Instructions (Addendum)
    Blood work was ordered.      Medications changes include :   none     Please followup in 6 months  

## 2020-08-22 ENCOUNTER — Ambulatory Visit (INDEPENDENT_AMBULATORY_CARE_PROVIDER_SITE_OTHER): Payer: BC Managed Care – PPO | Admitting: Internal Medicine

## 2020-08-22 ENCOUNTER — Other Ambulatory Visit: Payer: Self-pay

## 2020-08-22 VITALS — BP 142/82 | HR 62 | Temp 98.3°F | Ht 63.0 in | Wt 224.0 lb

## 2020-08-22 DIAGNOSIS — E782 Mixed hyperlipidemia: Secondary | ICD-10-CM | POA: Diagnosis not present

## 2020-08-22 DIAGNOSIS — K219 Gastro-esophageal reflux disease without esophagitis: Secondary | ICD-10-CM | POA: Diagnosis not present

## 2020-08-22 DIAGNOSIS — N281 Cyst of kidney, acquired: Secondary | ICD-10-CM

## 2020-08-22 DIAGNOSIS — I10 Essential (primary) hypertension: Secondary | ICD-10-CM | POA: Diagnosis not present

## 2020-08-22 DIAGNOSIS — R7303 Prediabetes: Secondary | ICD-10-CM

## 2020-08-22 DIAGNOSIS — D179 Benign lipomatous neoplasm, unspecified: Secondary | ICD-10-CM | POA: Insufficient documentation

## 2020-08-22 LAB — COMPREHENSIVE METABOLIC PANEL
ALT: 24 U/L (ref 0–35)
AST: 19 U/L (ref 0–37)
Albumin: 4.1 g/dL (ref 3.5–5.2)
Alkaline Phosphatase: 74 U/L (ref 39–117)
BUN: 10 mg/dL (ref 6–23)
CO2: 32 mEq/L (ref 19–32)
Calcium: 9.5 mg/dL (ref 8.4–10.5)
Chloride: 103 mEq/L (ref 96–112)
Creatinine, Ser: 1.02 mg/dL (ref 0.40–1.20)
GFR: 57.7 mL/min — ABNORMAL LOW (ref >60.00)
Glucose, Bld: 103 mg/dL — ABNORMAL HIGH (ref 70–99)
Potassium: 3.8 mEq/L (ref 3.5–5.1)
Sodium: 142 mEq/L (ref 135–145)
Total Bilirubin: 0.8 mg/dL (ref 0.2–1.2)
Total Protein: 6.6 g/dL (ref 6.0–8.3)

## 2020-08-22 LAB — LIPID PANEL
Cholesterol: 170 mg/dL (ref 0–200)
HDL: 63.9 mg/dL (ref >39.00)
LDL Cholesterol: 84 mg/dL (ref 0–99)
NonHDL: 106.25
Total CHOL/HDL Ratio: 3
Triglycerides: 109 mg/dL (ref 0.0–149.0)
VLDL: 21.8 mg/dL (ref 0.0–40.0)

## 2020-08-22 LAB — HEMOGLOBIN A1C: Hgb A1c MFr Bld: 6.3 % (ref 4.6–6.5)

## 2020-08-22 NOTE — Assessment & Plan Note (Addendum)
Chronic Check a1c Low sugar / carb diet Stressed regular exercise Advised weight loss 

## 2020-08-22 NOTE — Assessment & Plan Note (Signed)
Chronic Check lipid panel  Continue crestor 5 mg  Regular exercise and healthy diet encouraged

## 2020-08-22 NOTE — Assessment & Plan Note (Signed)
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

## 2020-08-22 NOTE — Assessment & Plan Note (Signed)
Chronic GERD controlled Continue pantoprazole 40 mg daily 

## 2020-08-22 NOTE — Assessment & Plan Note (Addendum)
Chronic BP well controlled at home-typically a little lower than here and her blood pressure cuff is accurate Continue diltiazem to 40 mg daily, lisinopril-hydrochlorothiazide 20-25 mg daily cmp

## 2020-08-22 NOTE — Assessment & Plan Note (Signed)
Left mid back She denies any pain and there has been no change in size This is likely benign If she develops any pain or if it starts to grow it will need to be removed, otherwise we will just monitor

## 2020-08-22 NOTE — Assessment & Plan Note (Signed)
Monitored by urology Right kidney 4.5 cm cyst, other cyst-all likely benign Has follow-up MRI scheduled

## 2020-09-29 ENCOUNTER — Other Ambulatory Visit: Payer: Self-pay

## 2020-09-29 ENCOUNTER — Encounter: Payer: Self-pay | Admitting: Internal Medicine

## 2020-09-29 ENCOUNTER — Other Ambulatory Visit: Payer: Self-pay | Admitting: Internal Medicine

## 2020-09-29 MED ORDER — LISINOPRIL-HYDROCHLOROTHIAZIDE 20-25 MG PO TABS: 1.0000 | ORAL_TABLET | Freq: Every day | ORAL | 2 refills | Status: DC

## 2020-10-16 ENCOUNTER — Other Ambulatory Visit: Payer: Self-pay

## 2020-10-16 ENCOUNTER — Ambulatory Visit (HOSPITAL_COMMUNITY)
Admission: RE | Admit: 2020-10-16 | Discharge: 2020-10-16 | Disposition: A | Discharge status: Home / Self Care | Payer: BC Managed Care – PPO | Source: Ambulatory Visit | Attending: Urology | Admitting: Urology

## 2020-10-16 DIAGNOSIS — N281 Cyst of kidney, acquired: Secondary | ICD-10-CM | POA: Insufficient documentation

## 2020-10-16 DIAGNOSIS — Z9049 Acquired absence of other specified parts of digestive tract: Secondary | ICD-10-CM | POA: Diagnosis not present

## 2020-10-16 DIAGNOSIS — K760 Fatty (change of) liver, not elsewhere classified: Secondary | ICD-10-CM | POA: Diagnosis not present

## 2020-10-16 IMAGING — MR MR ABDOMEN WO/W CM
18 series · 48 of 48 positions shown · IV contrast (gadavist)
Comparison: [DATE]

CLINICAL DATA: Follow-up complex cystic lesion of right kidney.

EXAM:
MRI ABDOMEN WITHOUT AND WITH CONTRAST
TECHNIQUE: Multiplanar multisequence MR imaging of the abdomen was performed
both before and after the administration of intravenous contrast.
CONTRAST:  10mL GADAVIST GADOBUTROL 1 MMOL/ML IV SOLN

[Series 3: T2 · coronal · 6.0mm · 1.56mm/px · 2 of 37 slices shown (1 of 2)]
[im 1/37]
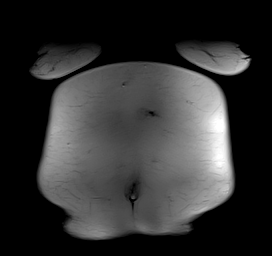
[im 37/37]
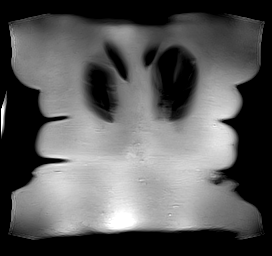

[Series 4: T2 fat-sat · axial · 6.0mm · 1.25mm/px · z∈[-72,+209]mm · 2 of 40 slices shown]
[im 1/40]
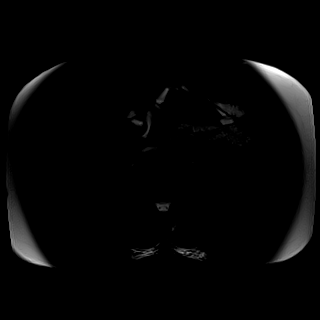
[im 40/40]
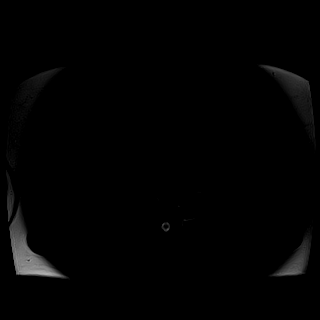

[Series 6: DWI · axial · 6.0mm · 1.49mm/px · z∈[-78,+203]mm · 4 of 80 slices shown (1 of 2)]
[im 1/80]
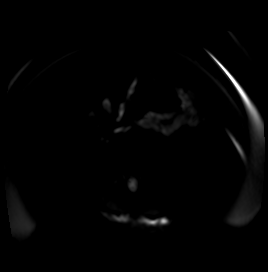
[im 27/80]
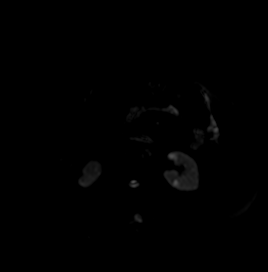
[im 53/80]
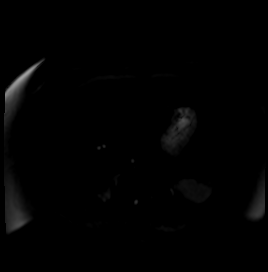
[im 80/80]
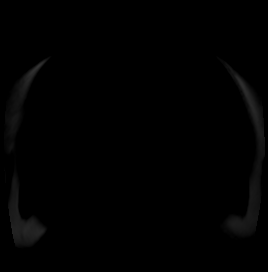

[Series 7: DWI · axial · 6.0mm · 1.49mm/px · z∈[-78,+203]mm · 2 of 40 slices shown (2 of 2)]
[im 1/40]
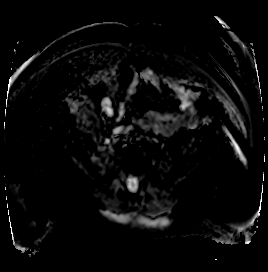
[im 40/40]
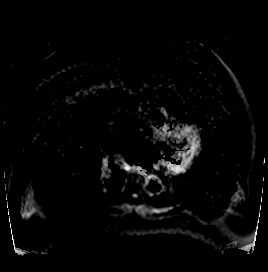

[Series 8: T1 · axial · 3.0mm · 1.25mm/px · z∈[-67,+194]mm · 3 of 88 slices shown (1 of 2)]
[im 1/88]
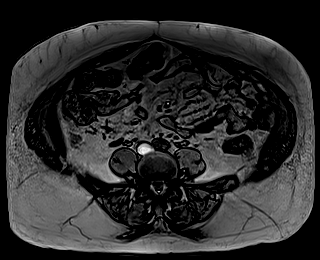
[im 44/88]
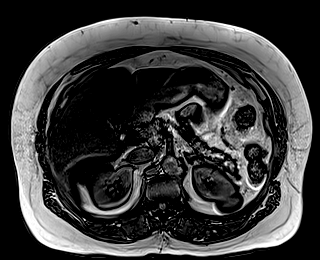
[im 88/88]
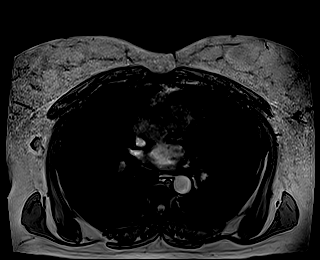

[Series 9: T1 · axial · 3.0mm · 1.25mm/px · z∈[-67,+194]mm · 3 of 88 slices shown (2 of 2)]
[im 1/88]
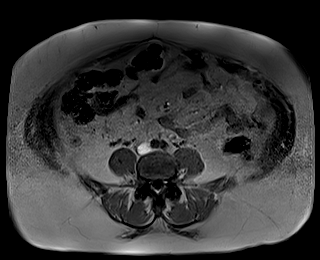
[im 44/88]
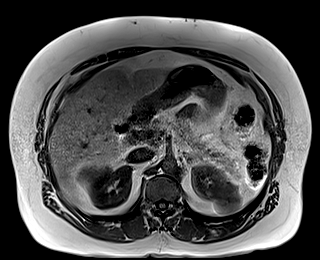
[im 88/88]
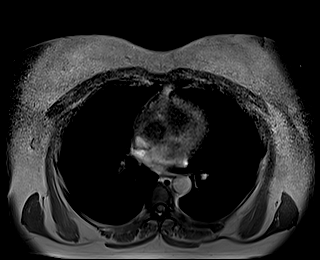

[Series 10: bSSFP · axial · 5.0mm · 0.84mm/px · z∈[-93,+155]mm · 2 of 46 slices shown]
[im 1/46]
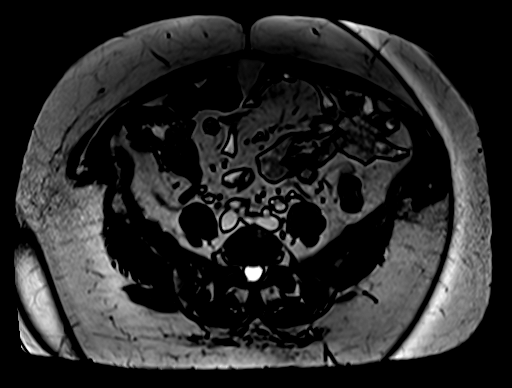
[im 46/46]
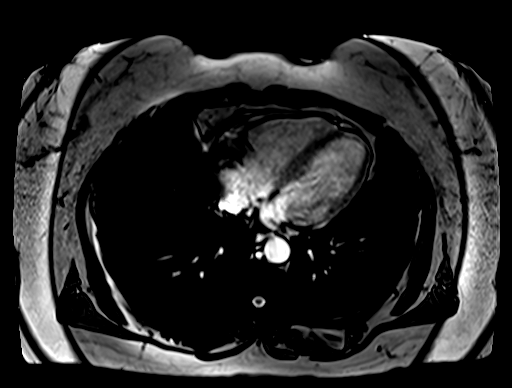

[Series 12: T1 dynamic · axial · 3.0mm · 1.25mm/px · z∈[-92,+169]mm · 3 of 88 slices shown (1 of 10)]
[im 1/88]
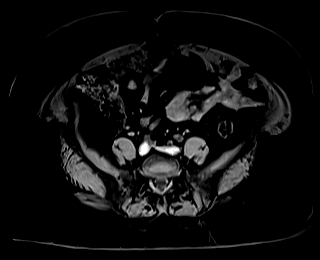
[im 44/88]
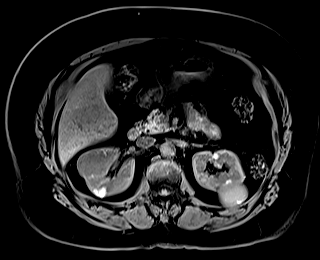
[im 88/88]
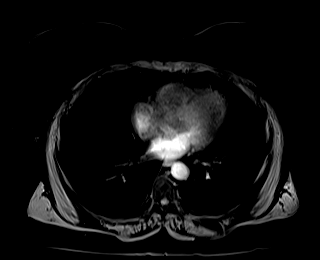

[Series 16: T1 dynamic · axial · 3.0mm · 1.25mm/px · z∈[-92,+169]mm · 3 of 88 slices shown (2 of 10)]
[im 1/88]
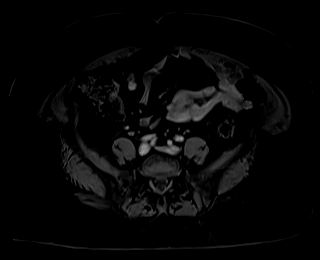
[im 44/88]
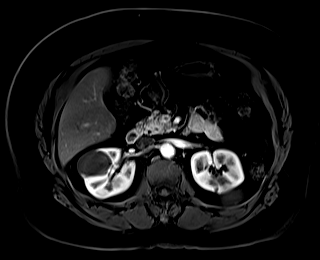
[im 88/88]
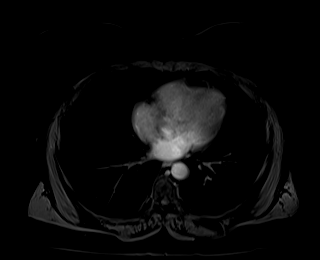

[Series 17: T1 dynamic · axial · 3.0mm · 1.25mm/px · z∈[-92,+169]mm · 3 of 88 slices shown (3 of 10)]
[im 1/88]
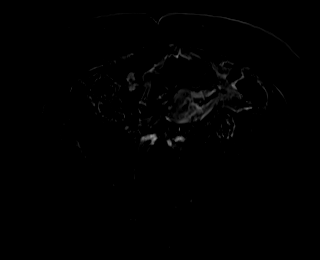
[im 44/88]
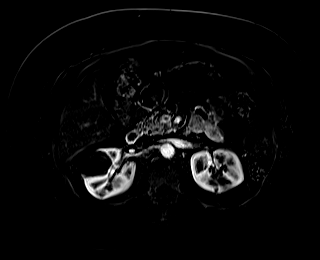
[im 88/88]
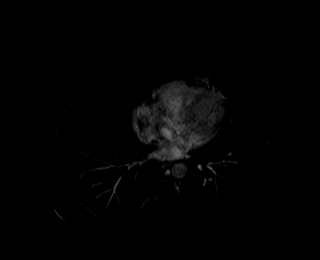

[Series 20: T1 dynamic · axial · 3.0mm · 1.25mm/px · z∈[-92,+169]mm · 3 of 88 slices shown (4 of 10)]
[im 1/88]
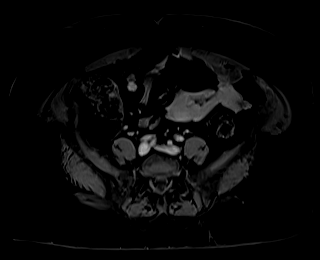
[im 44/88]
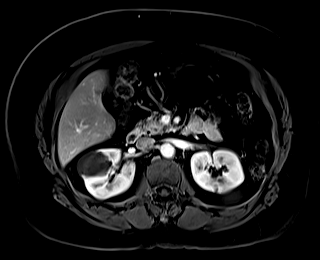
[im 88/88]
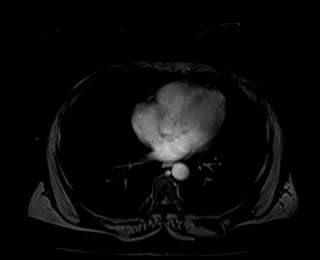

[Series 21: T1 dynamic · axial · 3.0mm · 1.25mm/px · z∈[-92,+169]mm · 3 of 88 slices shown (5 of 10)]
[im 1/88]
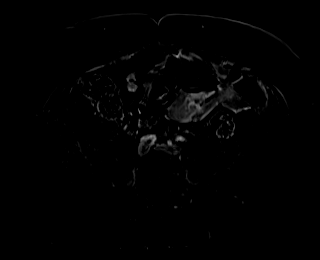
[im 44/88]
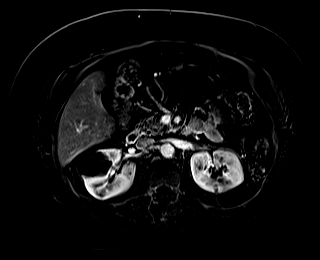
[im 88/88]
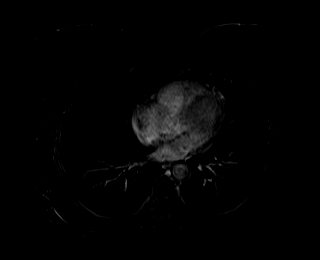

[Series 24: T1 dynamic · axial · 3.0mm · 1.25mm/px · z∈[-92,+169]mm · 3 of 88 slices shown (6 of 10)]
[im 1/88]
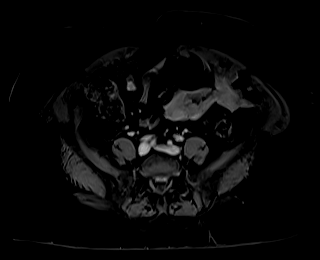
[im 44/88]
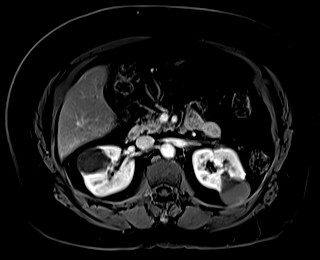
[im 88/88]
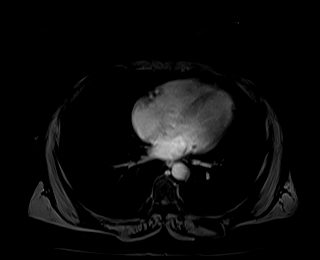

[Series 25: T1 dynamic · axial · 3.0mm · 1.25mm/px · z∈[-92,+169]mm · 3 of 88 slices shown (7 of 10)]
[im 1/88]
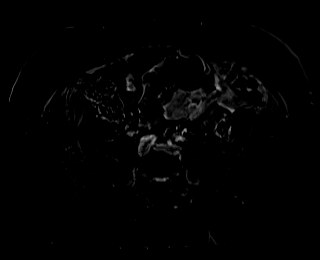
[im 44/88]
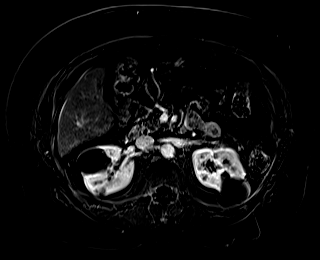
[im 88/88]
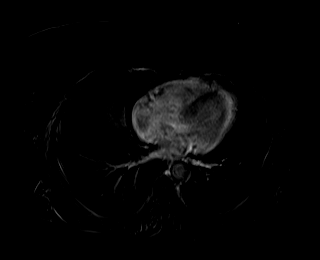

[Series 27: T1 dynamic · coronal · 5.0mm · 1.41mm/px · 2 of 56 slices shown (8 of 10)]
[im 1/56]
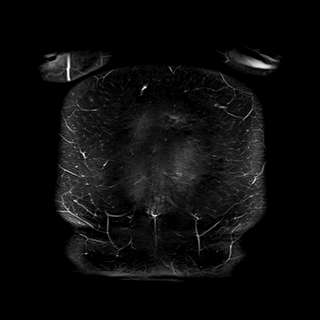
[im 56/56]
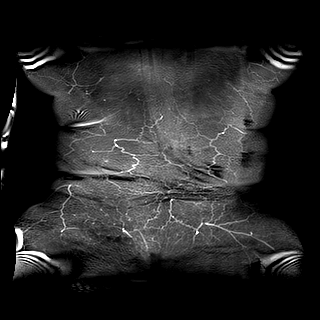

[Series 28: T2 · axial · 6.0mm · 1.56mm/px · 1 of 37 slices shown (2 of 2)]
[im 1/37]
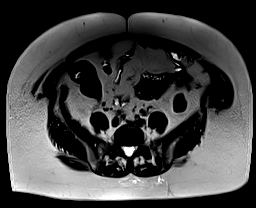

[Series 31: T1 dynamic · axial · 3.0mm · 1.25mm/px · z∈[-92,+169]mm · 3 of 88 slices shown (9 of 10)]
[im 1/88]
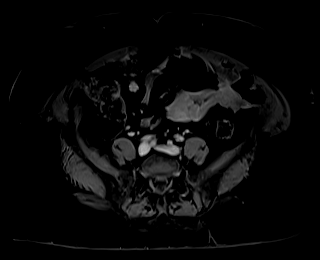
[im 44/88]
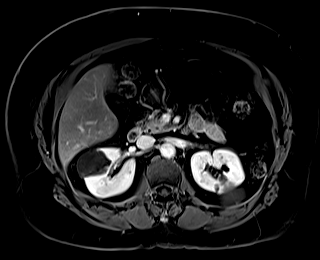
[im 88/88]
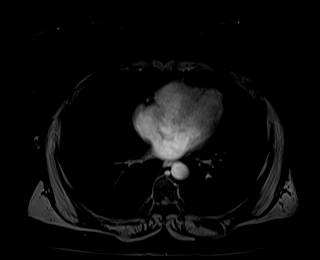

[Series 32: T1 dynamic · axial · 3.0mm · 1.25mm/px · z∈[-92,+169]mm · 3 of 88 slices shown (10 of 10)]
[im 1/88]
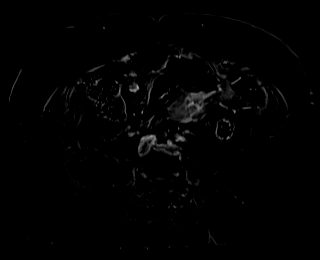
[im 44/88]
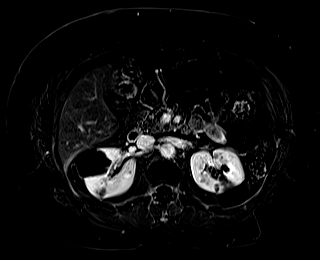
[im 88/88]
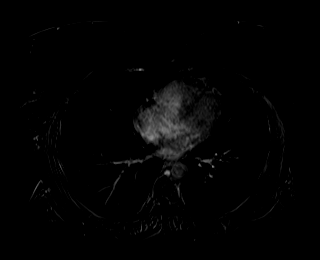

[48 of 48 positions shown; findings below may reference images not displayed]

FINDINGS: Lower chest: No acute findings.

Hepatobiliary: No hepatic masses identified. Moderate diffuse
hepatic steatosis again demonstrated. Prior cholecystectomy. No
evidence of biliary obstruction.

Pancreas:  No mass or inflammatory changes.

Spleen:  Within normal limits in size and appearance.

Adrenals/Urinary Tract: Normal adrenal glands. Multiple benign
Bosniak category 1 and 2 renal cysts are again seen bilaterally
which are stable. Complex cystic lesion is again seen in the lateral
midpole of the right kidney which measures 4.4 cm in maximum
diameter. This again shows several thin internal septations, but no
evidence of thickened or enhancing septations or nodules, and
remains stable in size and appearance. No new or enlarging renal
lesions identified. No evidence of hydronephrosis.

Stomach/Bowel: Visualized portion unremarkable.

Vascular/Lymphatic: No pathologically enlarged lymph nodes
identified. No abdominal aortic aneurysm.

Other:  None.

Musculoskeletal:  No suspicious bone lesions identified.
IMPRESSION: Stable 4.4 cm indeterminate but usually benign Bosniak category 2 F
cystic lesion in right kidneyrecommend continued follow-up by MRI in
12 months.

Stable benign Bosniak category 1 and 2 bilateral renal cysts.

Moderate hepatic steatosis.

## 2020-10-16 MED ORDER — GADOBUTROL 1 MMOL/ML IV SOLN
10.0000 mL | Freq: Once | INTRAVENOUS | Status: AC | PRN
  Administered 2020-10-16: 10 mL via INTRAVENOUS

## 2020-10-19 ENCOUNTER — Encounter: Payer: Self-pay | Admitting: Internal Medicine

## 2020-10-29 ENCOUNTER — Encounter: Payer: Self-pay | Admitting: Internal Medicine

## 2020-10-30 DIAGNOSIS — D179 Benign lipomatous neoplasm, unspecified: Secondary | ICD-10-CM | POA: Diagnosis not present

## 2020-10-30 DIAGNOSIS — N281 Cyst of kidney, acquired: Secondary | ICD-10-CM | POA: Diagnosis not present

## 2020-10-30 MED ORDER — PANTOPRAZOLE SODIUM 40 MG PO TBEC: 40.0000 mg | DELAYED_RELEASE_TABLET | Freq: Every day | ORAL | 3 refills | Status: DC

## 2020-11-12 ENCOUNTER — Other Ambulatory Visit: Payer: Self-pay

## 2020-11-12 ENCOUNTER — Ambulatory Visit (AMBULATORY_SURGERY_CENTER): Payer: Self-pay | Admitting: *Deleted

## 2020-11-12 VITALS — Ht 63.0 in | Wt 217.0 lb

## 2020-11-12 DIAGNOSIS — Z86010 Personal history of colonic polyps: Secondary | ICD-10-CM

## 2020-11-12 NOTE — Progress Notes (Signed)
No egg or soy allergy known to patient  No issues with past sedation with any surgeries or procedures- 30 yrs ago issues with waking but none since  Patient denies ever being told they had issues or difficulty with intubation  No FH of Malignant Hyperthermia No diet pills per patient No home 02 use per patient  No blood thinners per patient  Pt denies issues with constipation  No A fib or A flutter  EMMI video to pt or via [place] 19 guidelines implemented in PV today with Pt and RN  Pt is fully vaccinated  for Covid     Due to the COVID-19 pandemic we are asking patients to follow certain guidelines.  Pt aware of COVID protocols and [facility] guidelines   Pt verified name, DOB, address and insurance during PV today. Pt mailed instruction packet to included paper to complete and mail back to [facility] with addressed and stamped envelope, Emmi video, copy of consent form to read and not return, and instructions. PV completed over the phone. Pt encouraged to call with questions or issues. My Chart instructions to pt as well

## 2020-11-19 ENCOUNTER — Encounter: Payer: Self-pay | Admitting: Internal Medicine

## 2020-11-20 ENCOUNTER — Telehealth: Payer: Self-pay | Admitting: Internal Medicine

## 2020-11-20 NOTE — Telephone Encounter (Signed)
Inbound call from patient requesting to talk to nurse please.  Has additional questions in regards to her prep.

## 2020-11-20 NOTE — Telephone Encounter (Signed)
Can she drink slim fast-  Instructed no, has to be clear liquids - she can have the clear peach protein if she can have it

## 2020-11-27 ENCOUNTER — Other Ambulatory Visit: Payer: Self-pay

## 2020-11-27 ENCOUNTER — Ambulatory Visit (AMBULATORY_SURGERY_CENTER): Payer: BC Managed Care – PPO | Admitting: Internal Medicine

## 2020-11-27 ENCOUNTER — Encounter: Payer: Self-pay | Admitting: Internal Medicine

## 2020-11-27 VITALS — BP 92/65 | HR 60 | Temp 98.4°F | Resp 13 | Ht 63.0 in | Wt 217.0 lb

## 2020-11-27 DIAGNOSIS — D125 Benign neoplasm of sigmoid colon: Secondary | ICD-10-CM

## 2020-11-27 DIAGNOSIS — Z86010 Personal history of colonic polyps: Secondary | ICD-10-CM | POA: Diagnosis not present

## 2020-11-27 DIAGNOSIS — Z1211 Encounter for screening for malignant neoplasm of colon: Secondary | ICD-10-CM | POA: Diagnosis not present

## 2020-11-27 DIAGNOSIS — D122 Benign neoplasm of ascending colon: Secondary | ICD-10-CM

## 2020-11-27 MED ORDER — SODIUM CHLORIDE 0.9 % IV SOLN: 500.0000 mL | Freq: Once | INTRAVENOUS | Status: DC

## 2020-11-27 NOTE — Op Note (Signed)
[place] Patient Name: Tami Hughes Procedure Date: 11/27/2020 8:39 AM MRN: [id number] Endoscopist: Gatha Mayer , MD Age: 65 Referring MD:  Date of Birth: 12-Nov-1955 Gender: Female Account #: [id number] Procedure:                Colonoscopy Indications:              Surveillance: Personal history of adenomatous                            polyps on last colonoscopy > 5 years ago Medicines:                Propofol per Anesthesia, Monitored Anesthesia Care Procedure:                Pre-Anesthesia Assessment:                           - Prior to the procedure, a History and Physical                            was performed, and patient medications and                            allergies were reviewed. The patient's tolerance of                            previous anesthesia was also reviewed. The risks                            and benefits of the procedure and the sedation                            options and risks were discussed with the patient.                            All questions were answered, and informed consent                            was obtained. Prior Anticoagulants: The patient has                            taken no previous anticoagulant or antiplatelet                            agents. ASA Grade Assessment: III - A patient with                            severe systemic disease. After reviewing the risks                            and benefits, the patient was deemed in                            satisfactory condition to undergo the procedure.  After obtaining informed consent, the colonoscope                            was passed under direct vision. Throughout the                            procedure, the patient's blood pressure, pulse, and                            oxygen saturations were monitored continuously. The                            Olympus PFC-[id number] (#[id number]) Colonoscope was                             introduced through the anus and advanced to the the                            cecum, identified by appendiceal orifice and                            ileocecal valve. The colonoscopy was somewhat                            difficult due to significant looping. Successful                            completion of the procedure was aided by applying                            abdominal pressure. The patient tolerated the                            procedure well. The quality of the bowel                            preparation was good. The ileocecal valve,                            appendiceal orifice, and rectum were photographed.                            The bowel preparation used was Miralax via split                            dose instruction. Scope In: 8:54:01 AM Scope Out: 9:16:46 AM Scope Withdrawal Time: 0 hours 15 minutes 37 seconds  Total Procedure Duration: 0 hours 22 minutes 45 seconds  Findings:                 The perianal and digital rectal examinations were                            normal.  A 3 mm polyp was found in the sigmoid colon. The                            polyp was sessile. The polyp was removed with a                            cold snare. Resection and retrieval were complete.                            Verification of patient identification for the                            specimen was done. Estimated blood loss was minimal.                           A 1 mm polyp was found in the ascending colon. The                            polyp was sessile. The polyp was removed with a                            cold biopsy forceps. Resection and retrieval were                            complete. Verification of patient identification                            for the specimen was done. Estimated blood loss was                            minimal.                           The exam was otherwise without abnormality on                             direct and retroflexion views. Complications:            No immediate complications. Estimated Blood Loss:     Estimated blood loss was minimal. Impression:               - One 3 mm polyp in the sigmoid colon, removed with                            a cold snare. Resected and retrieved.                           - One 1 mm polyp in the ascending colon, removed                            with a cold biopsy forceps. Resected and retrieved.                           -  The examination was otherwise normal on direct                            and retroflexion views.                           - Personal history of colonic polyps.                           12 mm adenoma removed 2009                           2013 no polyps                           04/2017 4 diminutive polyps 3 adenomas and 1                            hyperplastic Recommendation:           - Patient has a contact number available for                            emergencies. The signs and symptoms of potential                            delayed complications were discussed with the                            patient. Return to normal activities tomorrow.                            Written discharge instructions were provided to the                            patient.                           - Resume previous diet.                           - Continue present medications.                           - Repeat colonoscopy is recommended for                            surveillance. The colonoscopy date will be                            determined after pathology results from today's                            exam become available for review. Gatha Mayer, MD 11/27/2020 9:26:44 AM This report has been signed electronically.

## 2020-11-27 NOTE — Patient Instructions (Addendum)
HANDOUTS PROVIDED:  POLYPS  Two tiny polyps removed today.  I will let you know pathology results and when to have another routine colonoscopy by mail and/or My Chart.  I appreciate the opportunity to care for you. Gatha Mayer, MD, FACG  YOU HAD AN ENDOSCOPIC PROCEDURE TODAY AT [facility]:   Refer to the procedure report that was given to you for any specific questions about what was found during the examination.  If the procedure report does not answer your questions, please call your gastroenterologist to clarify.  If you requested that your care partner not be given the details of your procedure findings, then the procedure report has been included in a sealed envelope for you to review at your convenience later.  YOU SHOULD EXPECT: Some feelings of bloating in the abdomen. Passage of more gas than usual.  Walking can help get rid of the air that was put into your GI tract during the procedure and reduce the bloating. If you had a lower endoscopy (such as a colonoscopy or flexible sigmoidoscopy) you may notice spotting of blood in your stool or on the toilet paper. If you underwent a bowel prep for your procedure, you may not have a normal bowel movement for a few days.  Please Note:  You might notice some irritation and congestion in your nose or some drainage.  This is from the oxygen used during your procedure.  There is no need for concern and it should clear up in a day or so.  SYMPTOMS TO REPORT IMMEDIATELY:   Following lower endoscopy (colonoscopy or flexible sigmoidoscopy):  Excessive amounts of blood in the stool  Significant tenderness or worsening of abdominal pains  Swelling of the abdomen that is new, acute  Fever of 100F or higher  For urgent or emergent issues, a gastroenterologist can be reached at any hour by calling [phone]. Do not use MyChart messaging for urgent concerns.    DIET:  We do recommend a small meal at first, but then you may  proceed to your regular diet.  Drink plenty of fluids but you should avoid alcoholic beverages for 24 hours.  ACTIVITY:  You should plan to take it easy for the rest of today and you should NOT DRIVE or use heavy machinery until tomorrow (because of the sedation medicines used during the test).    FOLLOW UP: Our staff will call the number listed on your records Monday morning between 7:15 am and 8:15 am following your procedure to check on you and address any questions or concerns that you may have regarding the information given to you following your procedure. If we do not reach you, we will leave a message.  We will attempt to reach you two times.  During this call, we will ask if you have developed any symptoms of COVID 19. If you develop any symptoms (ie: fever, flu-like symptoms, shortness of breath, cough etc.) before then, please call [phone].  If you test positive for Covid 19 in the 2 weeks post procedure, please call and report this information to [place].    If any biopsies were taken you will be contacted by phone or by letter within the next 1-3 weeks.  Please call us at [phone] if you have not heard about the biopsies in 3 weeks.    SIGNATURES/CONFIDENTIALITY: You and/or your care partner have signed paperwork which will be entered into your electronic medical record.  These signatures attest to the fact  that that the information above on your After Visit Summary has been reviewed and is understood.  Full responsibility of the confidentiality of this discharge information lies with you and/or your care-partner.

## 2020-11-27 NOTE — Progress Notes (Signed)
PT taken to PACU. Monitors in place. VSS. Report given to RN. 

## 2020-11-27 NOTE — Progress Notes (Signed)
Called to room to assist during endoscopic procedure.  Patient ID and intended procedure confirmed with present staff. Received instructions for my participation in the procedure from the performing physician.  

## 2020-11-27 NOTE — Progress Notes (Signed)
VS by CW  Pt's states no medical or surgical changes since previsit or office visit.  

## 2020-12-01 ENCOUNTER — Telehealth: Payer: Self-pay | Admitting: *Deleted

## 2020-12-01 NOTE — Telephone Encounter (Signed)
  Follow up Call-  Call back number 11/27/2020  Post procedure Call Back phone  # [phone]  Permission to leave phone message Yes  Some recent data might be hidden     Patient questions:  Do you have a fever, pain , or abdominal swelling? No. Pain Score  0 *  Have you tolerated food without any problems? Yes.    Have you been able to return to your normal activities? Yes.    Do you have any questions about your discharge instructions: Diet   No. Medications  No. Follow up visit  No.  Do you have questions or concerns about your Care? Yes.  -pt wanted to mention that she did not feel back to normal on the Friday after her procedure on Thursday. 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 RN instructed pt that she was likely still dehydrated on Friday and that may be why she didn't feel back to normal. Pt states that today she feels better and more back to normal and was able to return to work.   Actions: * If pain score is 4 or above: No action needed, pain <4.  1. Have you developed a fever since your procedure? no  2.   Have you had an respiratory symptoms (SOB or cough) since your procedure? no  3.   Have you tested positive for COVID 19 since your procedure no  4.   Have you had any family members/close contacts diagnosed with the COVID 19 since your procedure?  no   If yes to any of these questions please route to Joylene John, RN and Joella Prince, RN

## 2020-12-01 NOTE — Telephone Encounter (Signed)
  Follow up Call-  Call back number 11/27/2020  Post procedure Call Back phone  # [phone]  Permission to leave phone message Yes  Some recent data might be hidden     Patient questions:  Do you have a fever, pain , or abdominal swelling? No. Pain Score  0 *  Have you tolerated food without any problems? Yes.    Have you been able to return to your normal activities? Yes.    Do you have any questions about your discharge instructions: Diet   No. Medications  No. Follow up visit  No.  Do you have questions or concerns about your Care? No.  Actions: * If pain score is 4 or above: No action needed, pain <4.  1. Have you developed a fever since your procedure? no  2.   Have you had an respiratory symptoms (SOB or cough) since your procedure? no  3.   Have you tested positive for COVID 19 since your procedure no  4.   Have you had any family members/close contacts diagnosed with the COVID 19 since your procedure?  no   If yes to any of these questions please route to Joylene John, RN and Joella Prince, RN

## 2020-12-10 ENCOUNTER — Encounter: Payer: Self-pay | Admitting: Internal Medicine

## 2020-12-30 ENCOUNTER — Encounter: Payer: Self-pay | Admitting: Internal Medicine

## 2020-12-30 NOTE — Progress Notes (Signed)
**Note De-Identified Tami Obfuscation** Subjective:    Patient ID: Tami Hughes, female    DOB: 1955-11-30, 65 y.o.   MRN: [id number]  HPI The patient is here for an acute visit.  Tested positive for covid on 6/ 24.  Her symptoms improved and now has worsening symptoms that makes her feel like she has bronchitis.   July 3 - coughing up green phlegm, no fever.   July 4th - wheezing, fatigue, spent the day in bed   She is not having any shortness of breath with walking around.  When she lays she does feel her breathing is slightly labored.  She denies typical cold symptoms, headaches, body aches.  She is sleeping well.     Medications and allergies reviewed with patient and updated if appropriate.  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   Prediabetes 11/11/2016   Herpes simplex without mention of complication 08/65/7846   Morbid obesity ([place]) 05/02/2012   Personal history of colonic polyps 01/27/2012   VENOUS INSUFFICIENCY, LEGS 08/28/2010   BRONCHITIS, ACUTE 08/22/2008   Hyperlipidemia 02/22/2008   Essential hypertension 02/22/2008   GERD 02/22/2008    Current Outpatient Medications on File Prior to Visit  Medication Sig Dispense Refill   diltiazem (CARDIZEM CD) 240 MG 24 hr capsule Take 1 capsule (240 mg total) by mouth daily. 90 capsule 1   lisinopril-hydrochlorothiazide (ZESTORETIC) 20-25 MG tablet Take 1 tablet by mouth daily. 90 tablet 2   pantoprazole (PROTONIX) 40 MG tablet Take 1 tablet (40 mg total) by mouth daily. Take 30 minutes prior to a meal. 90 tablet 3   rosuvastatin (CRESTOR) 5 MG tablet Take 1 tablet by mouth once daily 90 tablet 0   triamcinolone cream (KENALOG) 0.1 % Apply 1 application topically 2 (two) times daily. 30 g 0   No current facility-administered medications on file prior to visit.    Past Medical History:  Diagnosis Date   Allergy    Arthritis    Dyslipidemia    GERD (gastroesophageal reflux  disease)    Hyperlipidemia    Hypertension    Polyp of colon 2009   adenoma benign   Shingles    recurrent: 08/2010, 10/2013    Past Surgical History:  Procedure Laterality Date   ABDOMINAL HYSTERECTOMY     TAH   CHOLECYSTECTOMY N/A 12/18/2014   Procedure: LAPAROSCOPIC CHOLECYSTECTOMY WITH INTRAOPERATIVE CHOLANGIOGRAM;  Surgeon: Johnathan Hausen, MD;  Location: [facility];  Service: General;  Laterality: N/A;   COLONOSCOPY     LABIOPLASTY     POLYPECTOMY     PUBOVAGINAL SLING  05/08/2010   stress urinary incontinence   VULVECTOMY     partial vulvectomy secondary to labial hypertrophy..    Social History   Socioeconomic History   Marital status: Widowed    Spouse name: Not on file   Number of children: 2   Years of education: 70   Highest education level: Not on file  Occupational History   Occupation: paralegal    Employer: [employer]  Tobacco Use   Smoking status: Never   Smokeless tobacco: Never  Substance and Sexual Activity   Alcohol use: Yes    Alcohol/week: 2.0 standard drinks    Types: 2 Standard drinks or equivalent per week    Comment: wine on weekends   Drug use: No   Sexual activity: Yes    Partners: Male  Other Topics Concern   Not on file  Social History Narrative   [employer], [employer] - [place], [employer] - masters in Energy manager. Married '74 - 27 yrs/widowed. 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 Has high potential for work but will finish master's first. Lives alone.       CPA   Widow, single   2 children, 2 grandchildren   No regular exercise   1 large cup of coffee daily   Social Determinants of Health   Financial Resource Strain: Not on file  Food Insecurity: Not on file  Transportation Needs: Not on file  Physical Activity: Not on file  Stress: Not on file  Social Connections: Not on file    Family History  Problem Relation Age of Onset   Kidney disease Mother        had transplant   Breast cancer Mother     Heart disease Sister    Heart attack Sister    Colon cancer Neg Hx    Esophageal cancer Neg Hx    Rectal cancer Neg Hx    Stomach cancer Neg Hx    Diabetes Neg Hx    Hyperlipidemia Neg Hx    COPD Neg Hx    Colon polyps Neg Hx     Review of Systems  Constitutional:  Positive for fatigue. Negative for chills and fever.  HENT:  Positive for voice change. Negative for congestion, ear pain, sinus pressure, sinus pain and sore throat.   Respiratory:  Positive for cough (green sputum) and wheezing (when laying down). Negative for shortness of breath (with walking).   Musculoskeletal:  Negative for myalgias.  Neurological:  Negative for headaches.      Objective:   Vitals:   12/31/20 0821  BP: 128/72  Pulse: 81  Temp: 98.1 F (36.7 C)  SpO2: 99%   BP Readings from Last 3 Encounters:  12/31/20 128/72  11/27/20 92/65  08/22/20 (!) 142/82   Wt Readings from Last 3 Encounters:  12/31/20 226 lb (102.5 kg)  11/27/20 217 lb (98.4 kg)  11/12/20 217 lb (98.4 kg)   Body mass index is 40.03 kg/m.   Physical Exam    GENERAL APPEARANCE: Appears stated age, well appearing, NAD EYES: conjunctiva clear, no icterus HENT: oropharynx with no erythema or exudates, trachea midline, no cervical or supraclavicular lymphadenopathy LUNGS: Unlabored breathing, good air entry bilaterally, minimal very mild expiratory wheeze on occasion, no crackles CARDIOVASCULAR: Normal S1,S2 , no edema SKIN: Warm, dry      Assessment & Plan:    See Problem List for Assessment and Plan of chronic medical problems.    This visit occurred during the SARS-CoV-2 public health emergency.  Safety protocols were in place, including screening questions prior to the visit, additional usage of staff PPE, and extensive cleaning of exam room while observing appropriate contact time as indicated for disinfecting solutions.

## 2020-12-31 ENCOUNTER — Encounter: Payer: Self-pay | Admitting: Internal Medicine

## 2020-12-31 ENCOUNTER — Other Ambulatory Visit: Payer: Self-pay

## 2020-12-31 ENCOUNTER — Ambulatory Visit (INDEPENDENT_AMBULATORY_CARE_PROVIDER_SITE_OTHER): Payer: BC Managed Care – PPO | Admitting: Internal Medicine

## 2020-12-31 ENCOUNTER — Other Ambulatory Visit: Payer: Self-pay | Admitting: Internal Medicine

## 2020-12-31 DIAGNOSIS — J209 Acute bronchitis, unspecified: Secondary | ICD-10-CM

## 2020-12-31 MED ORDER — AZITHROMYCIN 250 MG PO TABS: ORAL_TABLET | ORAL | 0 refills | Status: DC

## 2020-12-31 NOTE — Patient Instructions (Signed)
   Medications changes include :   zpak  Your prescription(s) have been submitted to your pharmacy. Please take as directed and contact our office if you believe you are having problem(s) with the medication(s).   Continue your over the counter cold medications.    Please call if there is no improvement in your symptoms.

## 2020-12-31 NOTE — Assessment & Plan Note (Signed)
Acute She just recovered from [place] and her symptoms had improved and then started to worsen again Experiencing productive cough with discolored sputum, wheezing and significant fatigue Some of her symptoms could be residual from COVID, but I agree there is a concern for acute bronchitis on top of recovering from [place] Will start a Z-Pak Continue over-the-counter cold medications, cough medications Continue increase fluids and rest Call if symptoms do not improve

## 2021-03-24 DIAGNOSIS — M13842 Other specified arthritis, left hand: Secondary | ICD-10-CM | POA: Diagnosis not present

## 2021-03-24 DIAGNOSIS — M79641 Pain in right hand: Secondary | ICD-10-CM | POA: Diagnosis not present

## 2021-03-24 DIAGNOSIS — M13841 Other specified arthritis, right hand: Secondary | ICD-10-CM | POA: Diagnosis not present

## 2021-04-03 ENCOUNTER — Other Ambulatory Visit: Payer: Self-pay | Admitting: Internal Medicine

## 2021-04-06 DIAGNOSIS — H35033 Hypertensive retinopathy, bilateral: Secondary | ICD-10-CM | POA: Diagnosis not present

## 2021-04-22 ENCOUNTER — Other Ambulatory Visit: Payer: Self-pay | Admitting: Internal Medicine

## 2021-04-22 DIAGNOSIS — Z1231 Encounter for screening mammogram for malignant neoplasm of breast: Secondary | ICD-10-CM

## 2021-05-07 ENCOUNTER — Other Ambulatory Visit: Payer: Self-pay | Admitting: Internal Medicine

## 2021-05-07 ENCOUNTER — Encounter: Payer: Self-pay | Admitting: Internal Medicine

## 2021-05-13 ENCOUNTER — Encounter: Payer: Self-pay | Admitting: Internal Medicine

## 2021-05-18 ENCOUNTER — Encounter: Payer: Self-pay | Admitting: Internal Medicine

## 2021-06-02 ENCOUNTER — Encounter: Payer: Self-pay | Admitting: Internal Medicine

## 2021-06-06 ENCOUNTER — Other Ambulatory Visit: Payer: Self-pay | Admitting: Internal Medicine

## 2021-06-08 ENCOUNTER — Encounter: Payer: Self-pay | Admitting: Internal Medicine

## 2021-06-09 ENCOUNTER — Ambulatory Visit
Admission: RE | Admit: 2021-06-09 | Discharge: 2021-06-09 | Disposition: A | Discharge status: Home / Self Care | Payer: BC Managed Care – PPO | Source: Ambulatory Visit

## 2021-06-09 ENCOUNTER — Other Ambulatory Visit: Payer: Self-pay | Admitting: Internal Medicine

## 2021-06-09 DIAGNOSIS — Z1231 Encounter for screening mammogram for malignant neoplasm of breast: Secondary | ICD-10-CM | POA: Diagnosis not present

## 2021-06-09 IMAGING — MG MM DIGITAL SCREENING BILAT W/ TOMO AND CAD
6 of 10 series · 6 of 30 positions shown · non-contrast
Comparison: Previous exams.

CLINICAL DATA: Screening.

EXAM:
DIGITAL SCREENING BILATERAL MAMMOGRAM WITH TOMOSYNTHESIS AND CAD
TECHNIQUE: Bilateral screening digital craniocaudal and mediolateral oblique
mammograms were obtained. Bilateral screening digital breast
tomosynthesis was performed. The images were evaluated with
computer-aided detection.

[R CC synth-2D]
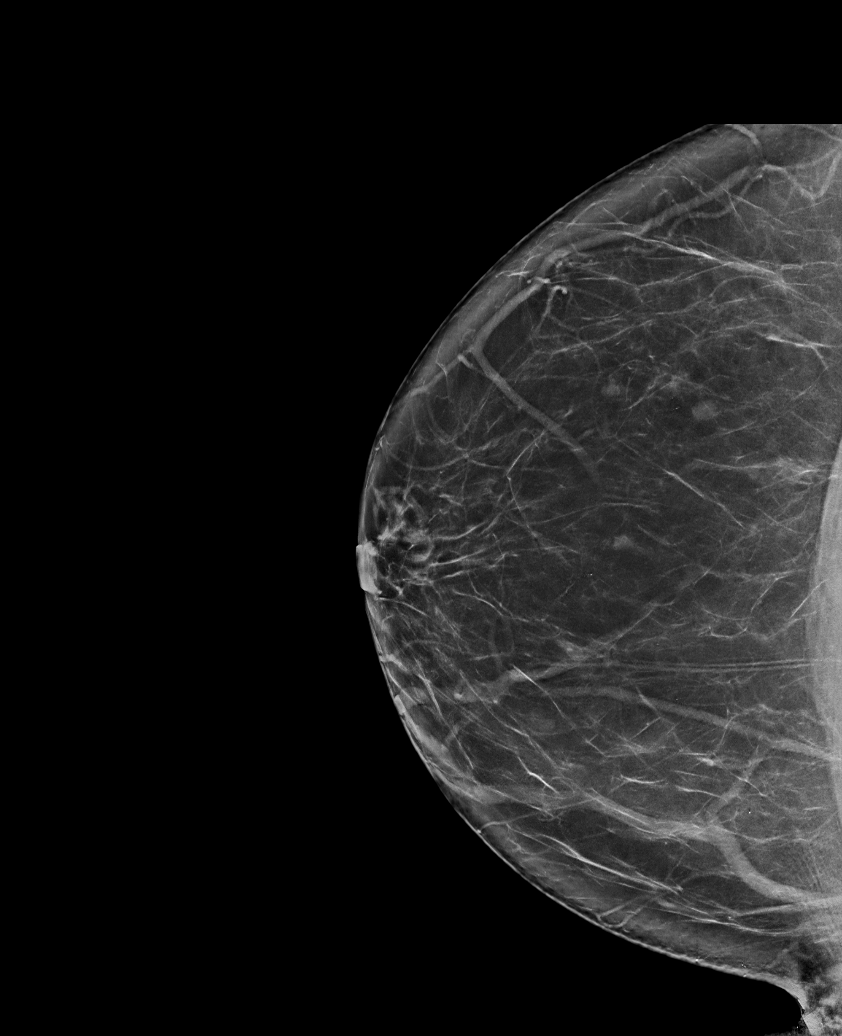

[L CC synth-2D]
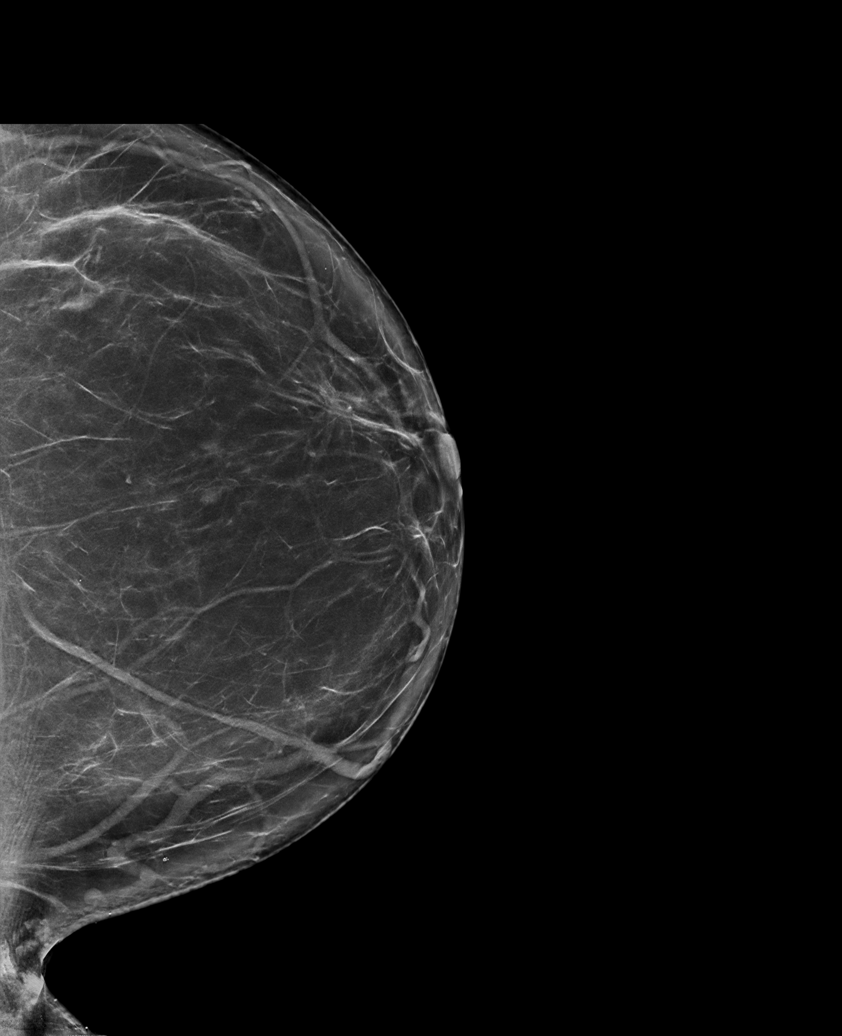

[R MLO synth-2D]
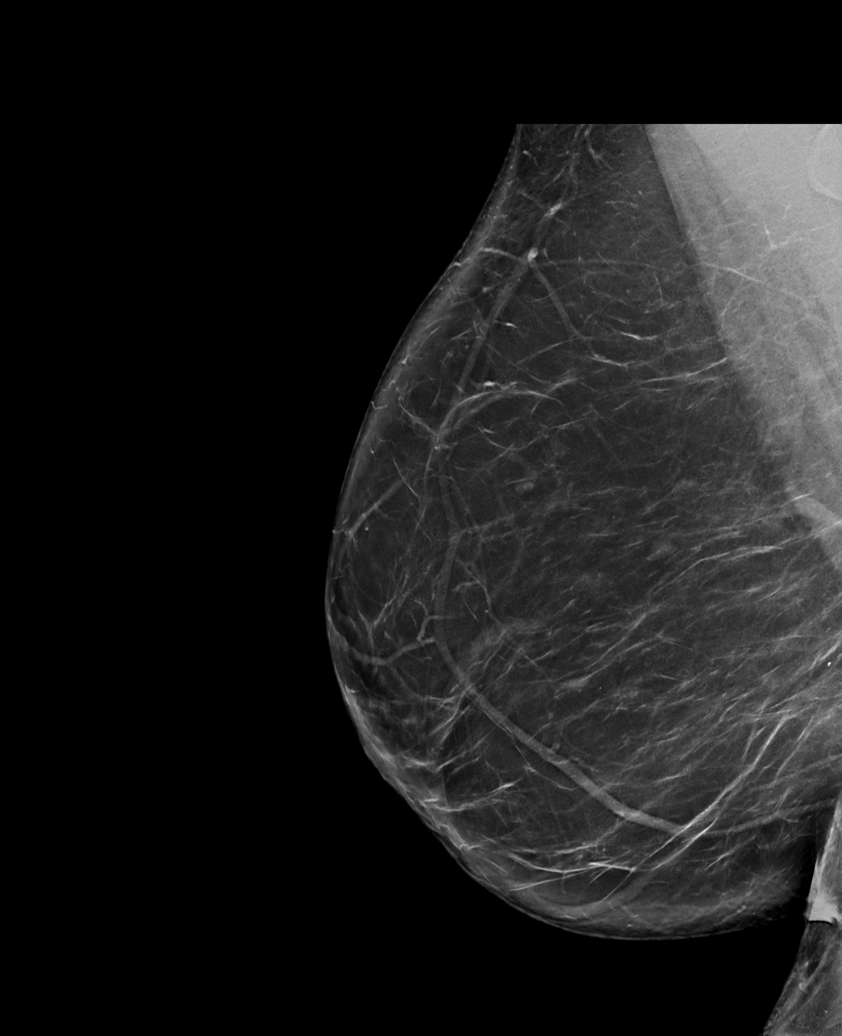

[L MLO synth-2D (1 of 2)]
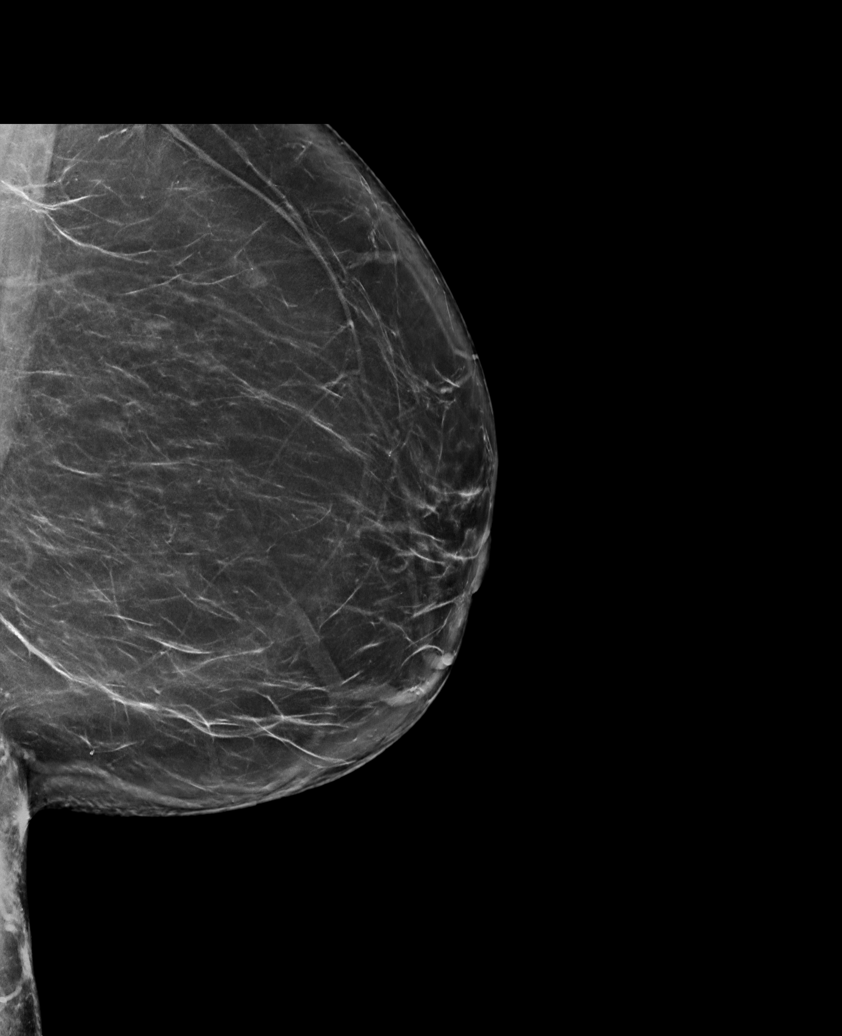

[L MLO synth-2D (2 of 2)]
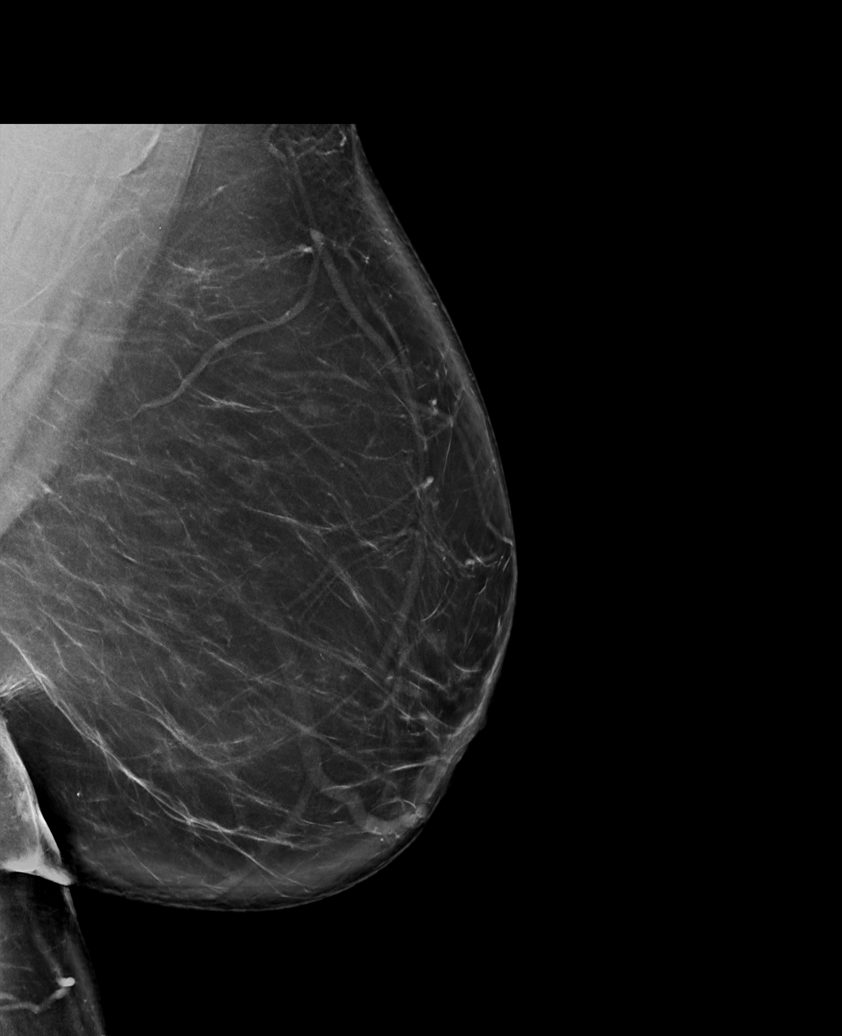

[L CC tomo · tomo slice 46/91.0]
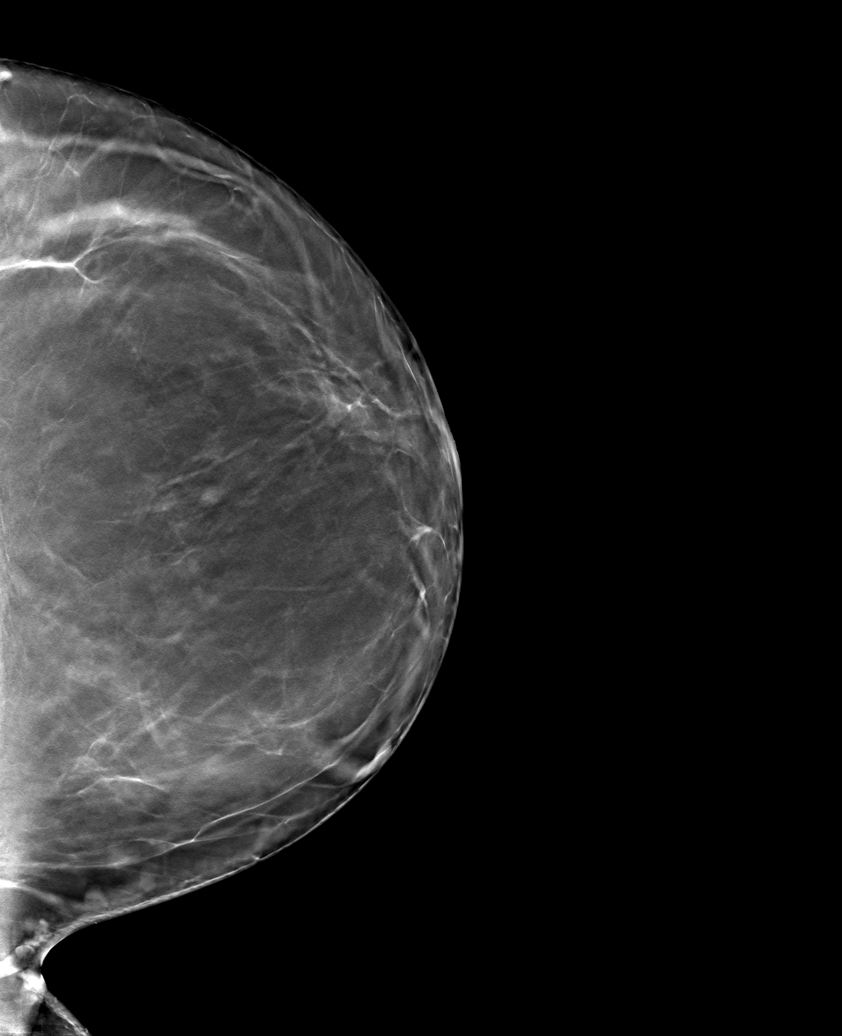

[6 of 30 positions shown; findings below may reference images not displayed]

ACR Breast Density Category b: There are scattered areas of
fibroglandular density.
FINDINGS: In the left breast, a possible mass warrants further evaluation. In
the right breast, no findings suspicious for malignancy.
IMPRESSION: Further evaluation is suggested for a possible mass in the left
breast.

RECOMMENDATION:
Diagnostic mammogram and possibly ultrasound of the left breast.
(Code:[MW])

The patient will be contacted regarding the findings, and additional
imaging will be scheduled.

BI-RADS CATEGORY  0: Incomplete. Need additional imaging evaluation
and/or prior mammograms for comparison.

## 2021-06-10 ENCOUNTER — Other Ambulatory Visit: Payer: Self-pay | Admitting: Internal Medicine

## 2021-06-10 DIAGNOSIS — R928 Other abnormal and inconclusive findings on diagnostic imaging of breast: Secondary | ICD-10-CM

## 2021-06-25 ENCOUNTER — Other Ambulatory Visit: Payer: Self-pay | Admitting: Internal Medicine

## 2021-06-26 ENCOUNTER — Ambulatory Visit
Admission: RE | Admit: 2021-06-26 | Discharge: 2021-06-26 | Disposition: A | Discharge status: Home / Self Care | Payer: BC Managed Care – PPO | Source: Ambulatory Visit | Attending: Internal Medicine | Admitting: Internal Medicine

## 2021-06-26 ENCOUNTER — Other Ambulatory Visit: Payer: Self-pay

## 2021-06-26 DIAGNOSIS — R928 Other abnormal and inconclusive findings on diagnostic imaging of breast: Secondary | ICD-10-CM

## 2021-06-26 DIAGNOSIS — R922 Inconclusive mammogram: Secondary | ICD-10-CM | POA: Diagnosis not present

## 2021-06-26 IMAGING — US US BREAST*L* LIMITED INC AXILLA
1 series · 6 of 6 positions shown · non-contrast
Comparison: Previous exam(s).

CLINICAL DATA: Recalled from screening mammography, possible mass
involving the upper LEFT breast at middle to posterior depth.

EXAM:
DIGITAL DIAGNOSTIC UNILATERAL LEFT MAMMOGRAM WITH TOMOSYNTHESIS AND
CAD; ULTRASOUND LEFT BREAST LIMITED
TECHNIQUE: Left digital diagnostic mammography and breast tomosynthesis was
performed. The images were evaluated with computer-aided detection.;
Targeted ultrasound examination of the left breast was performed.

[Series 1: us breast*left* limited inc axilla · 0.06mm/px · 6 of 6 slices shown]
[im 1/6]
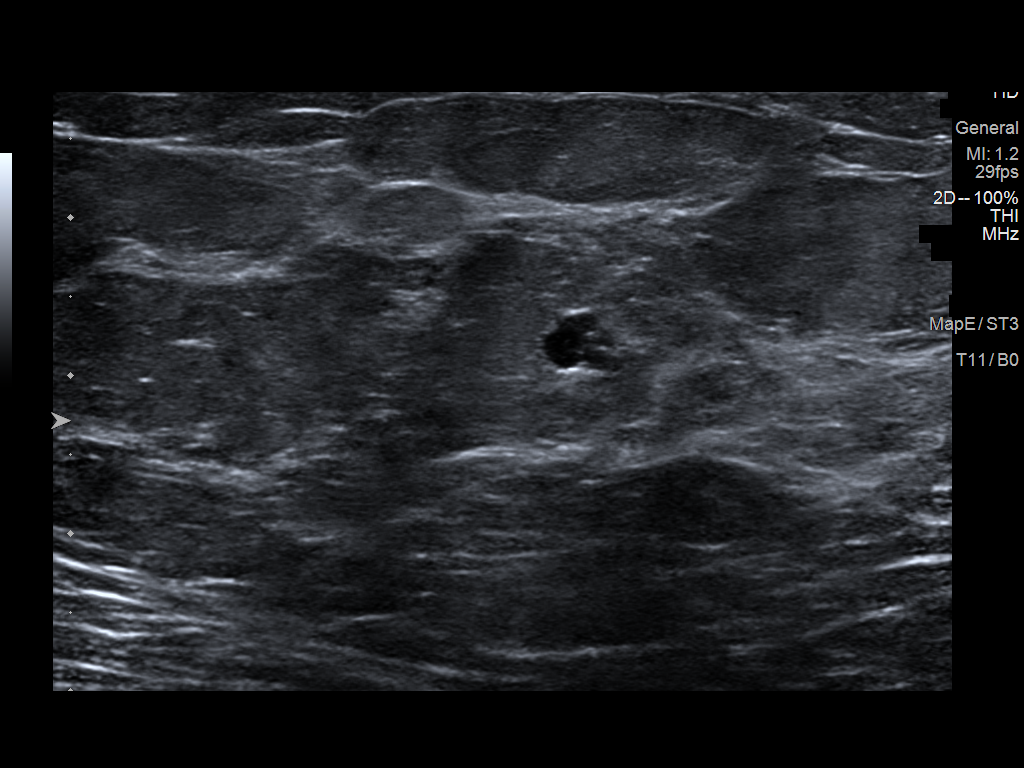
[im 2/6]
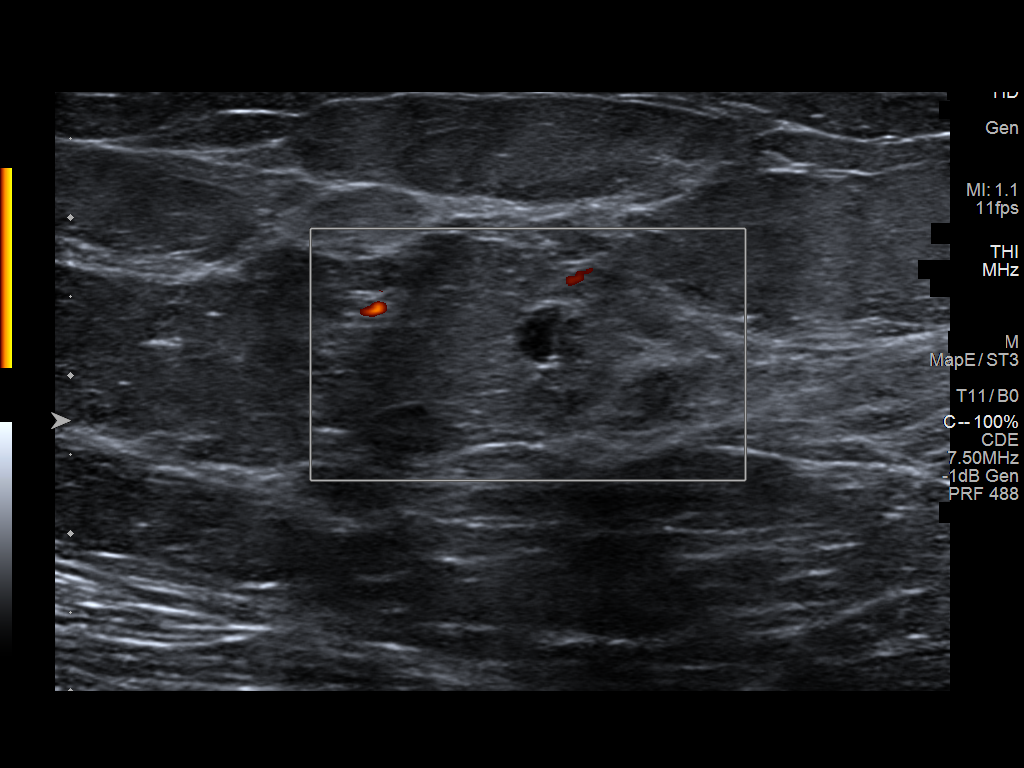
[im 3/6]
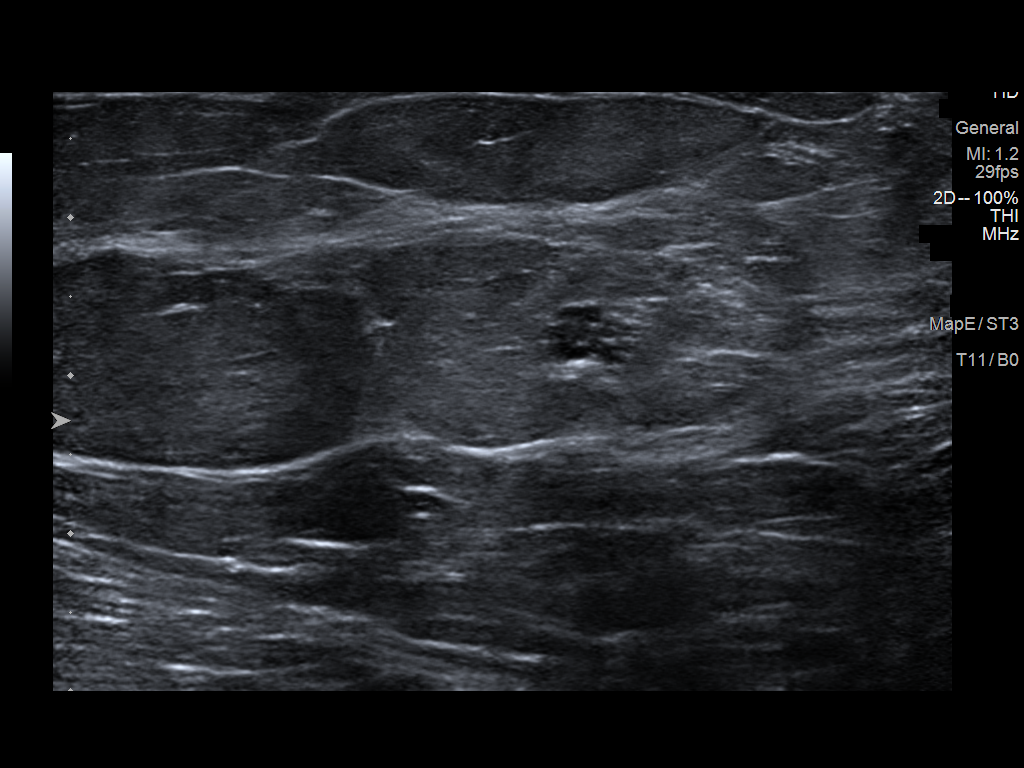
[im 4/6]
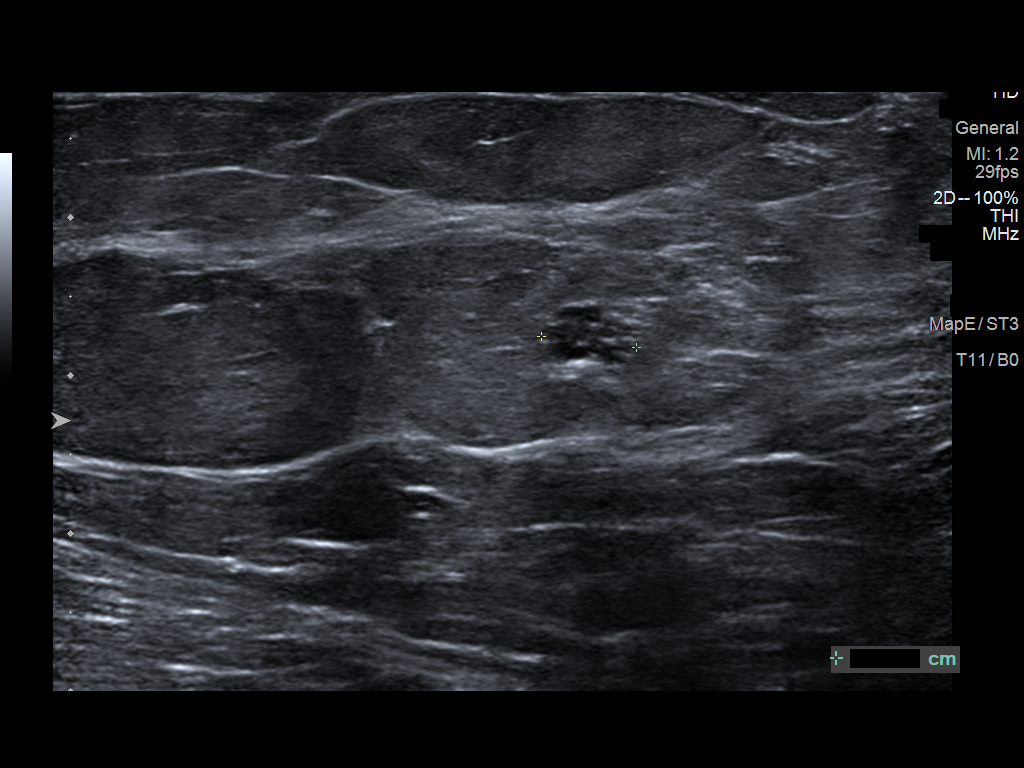
[im 5/6]
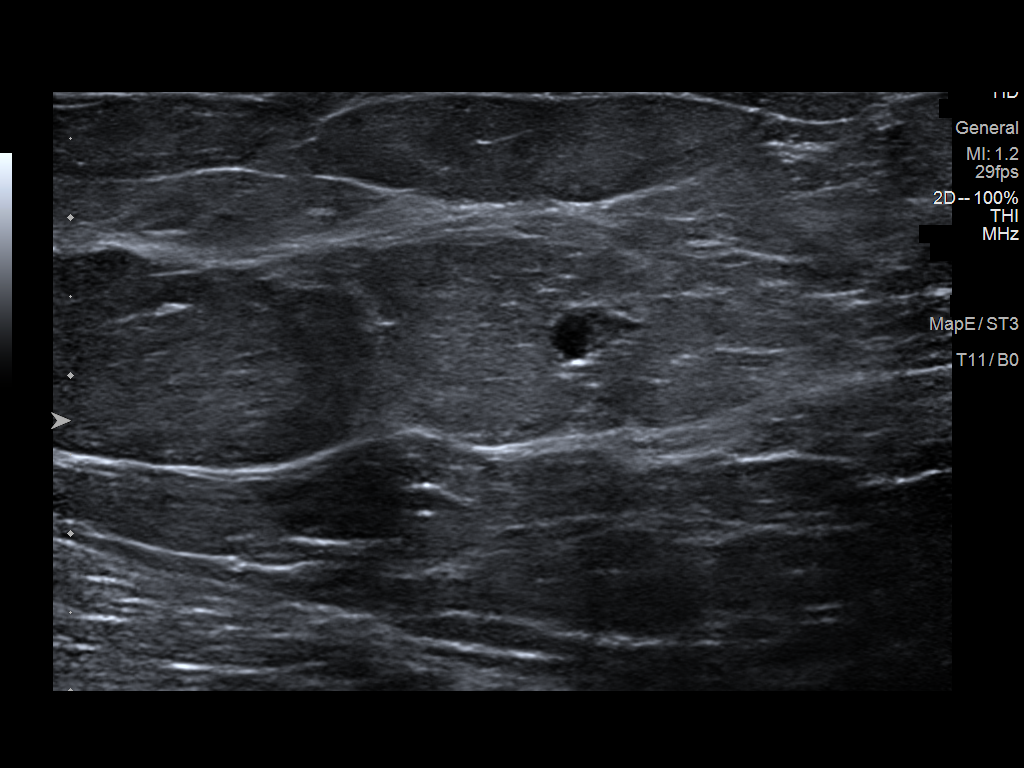
[im 6/6]
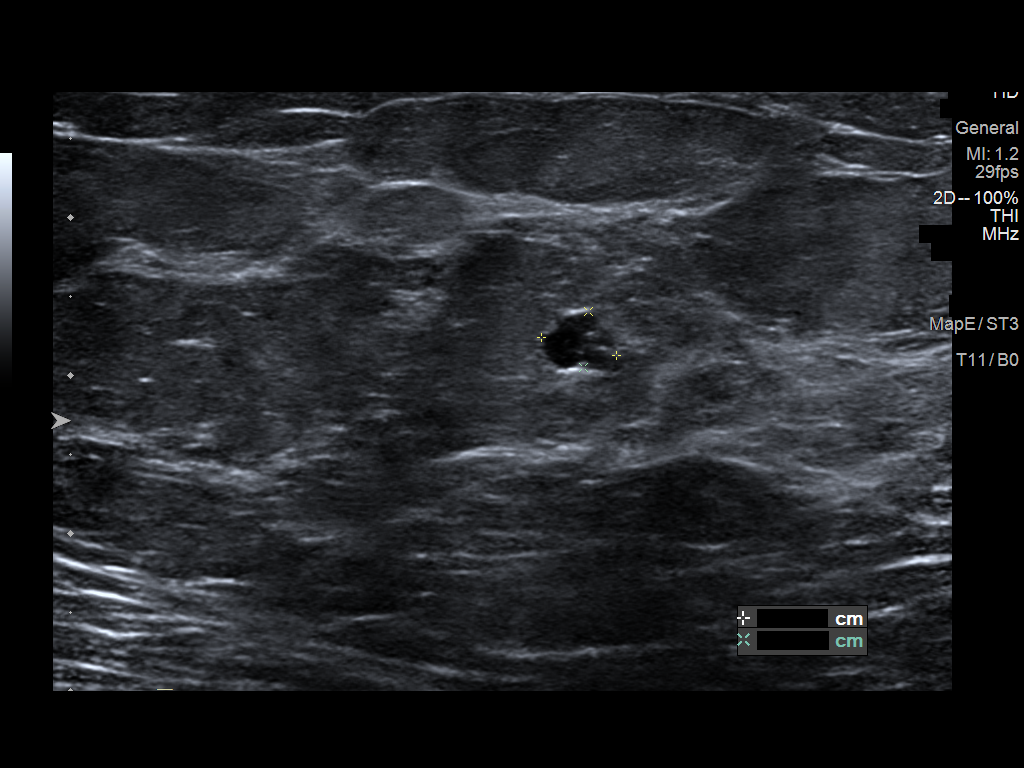

[6 of 6 positions shown; findings below may reference images not displayed]

ACR Breast Density Category b: There are scattered areas of
fibroglandular density.
FINDINGS: Spot compression CC and MLO views of the area of concern were
obtained.

There is an oval circumscribed isodense mass with possible central
fat in the upper breast at middle to posterior depth measuring
approximately 6 mm. There is no associated architectural distortion
or suspicious calcifications.

Targeted ultrasound was performed, demonstrating benign clustered
cysts at the 11 o'clock position 7 cm from nipple at middle depth
measuring approximately 6 x 4 x 5 mm, demonstrating posterior
acoustic enhancement and no internal power Doppler flow, accounting
for the screening mammographic finding. No suspicious solid mass or
abnormal acoustic shadowing is identified.
IMPRESSION: 1. No mammographic or sonographic evidence of malignancy involving
the LEFT breast.
2. Benign 6 mm clustered cysts at the 11 o'clock position 7 cm from
the nipple which accounts for the screening mammographic finding.

RECOMMENDATION:
Screening mammogram in one year.(Code:[CR])

I have discussed the findings and recommendations with the patient.
If applicable, a reminder letter will be sent to the patient
regarding the next appointment.

BI-RADS CATEGORY  2: Benign.

## 2021-06-26 IMAGING — MG MM DIGITAL DIAGNOSTIC UNILAT*L* W/ TOMO W/ CAD
6 series · 6 of 18 positions shown · non-contrast
Comparison: Previous exam(s).

CLINICAL DATA: Recalled from screening mammography, possible mass
involving the upper LEFT breast at middle to posterior depth.

EXAM:
DIGITAL DIAGNOSTIC UNILATERAL LEFT MAMMOGRAM WITH TOMOSYNTHESIS AND
CAD; ULTRASOUND LEFT BREAST LIMITED
TECHNIQUE: Left digital diagnostic mammography and breast tomosynthesis was
performed. The images were evaluated with computer-aided detection.;
Targeted ultrasound examination of the left breast was performed.

[L MLO synth-2D (1 of 2)]
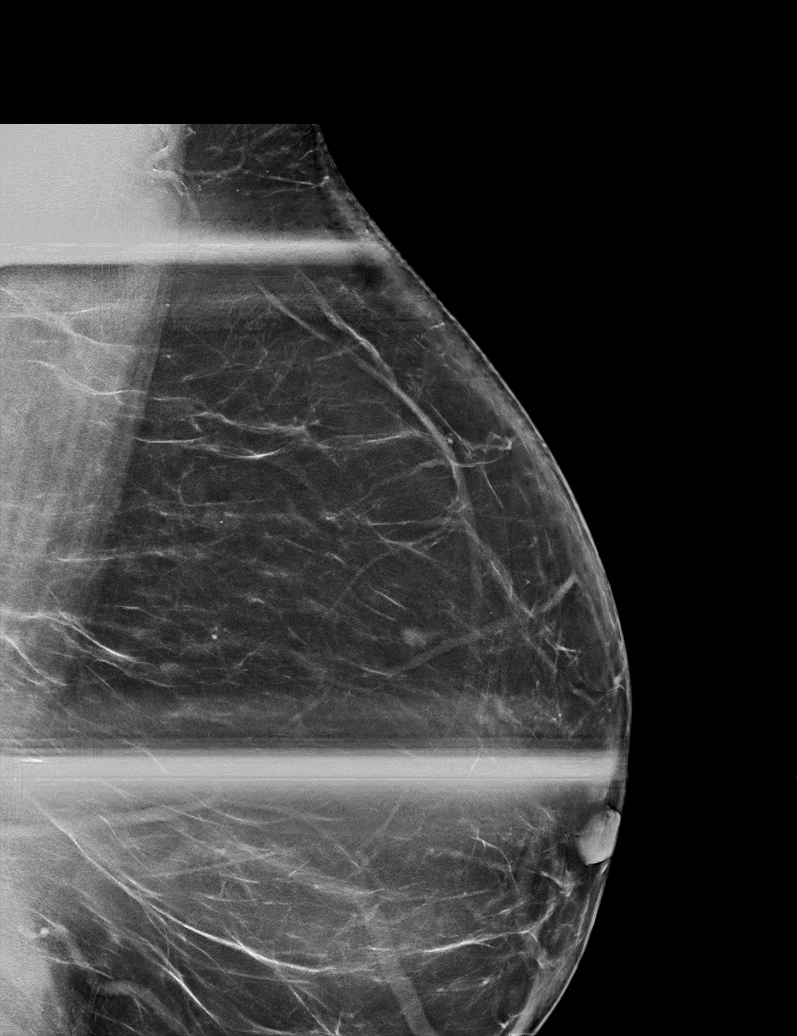

[L CC synth-2D]
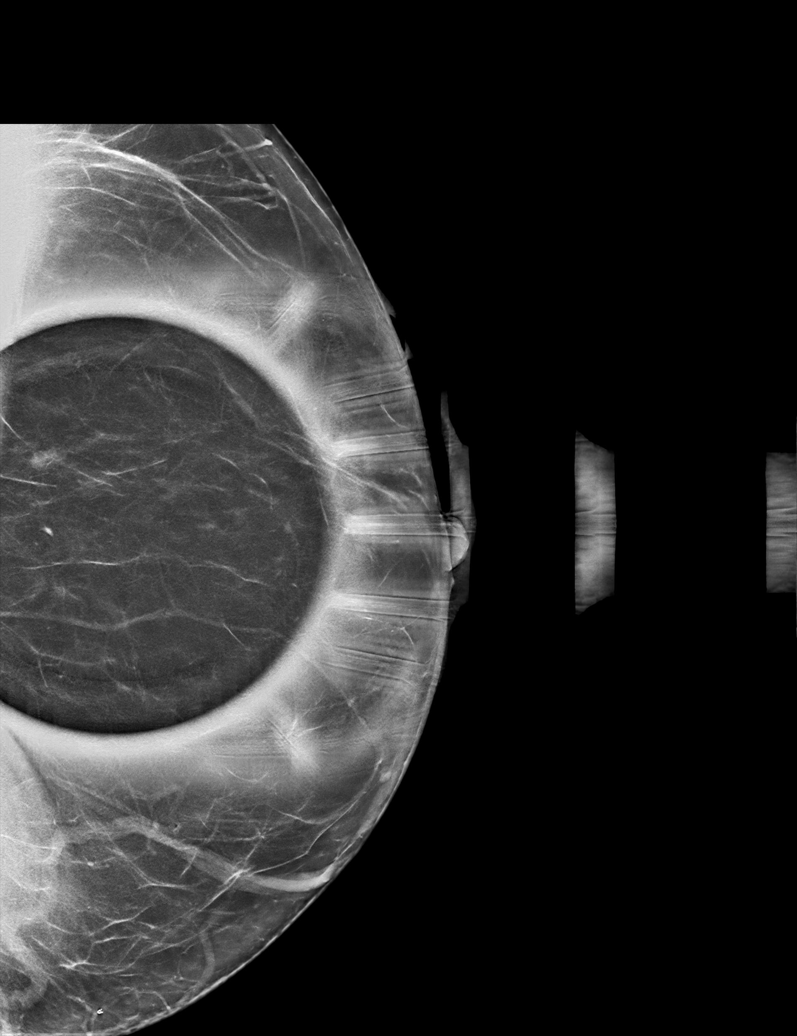

[L MLO synth-2D (2 of 2)]
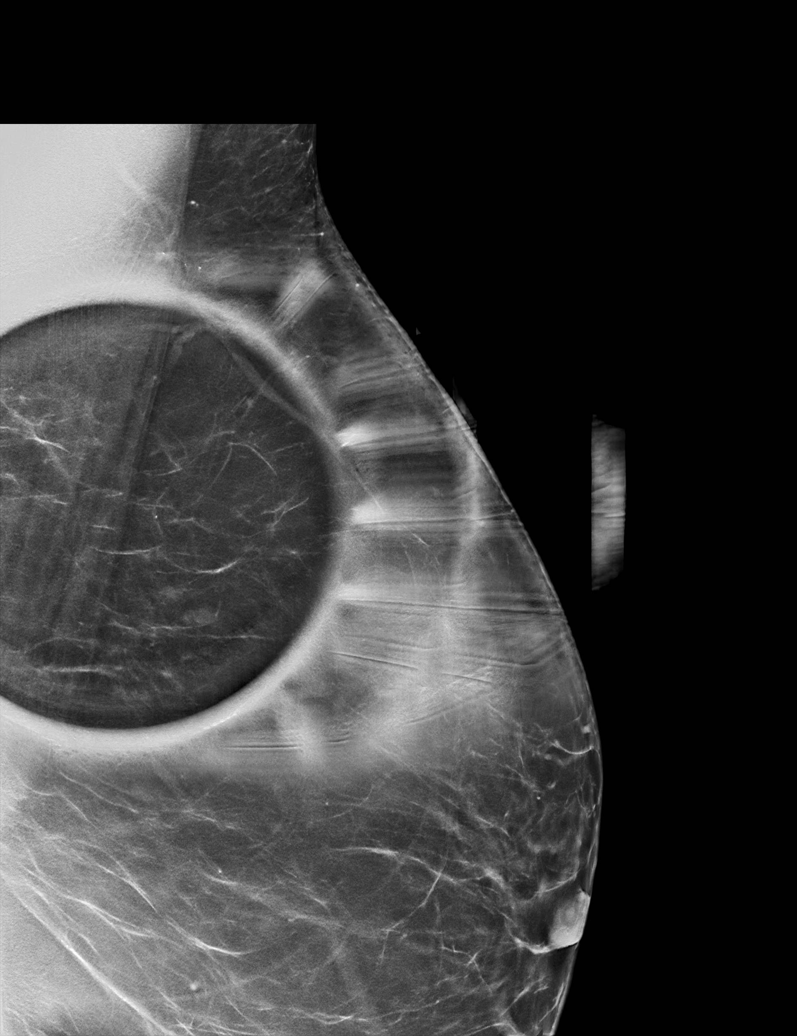

[L MLO tomo (1 of 2) · tomo slice 45/90.0]
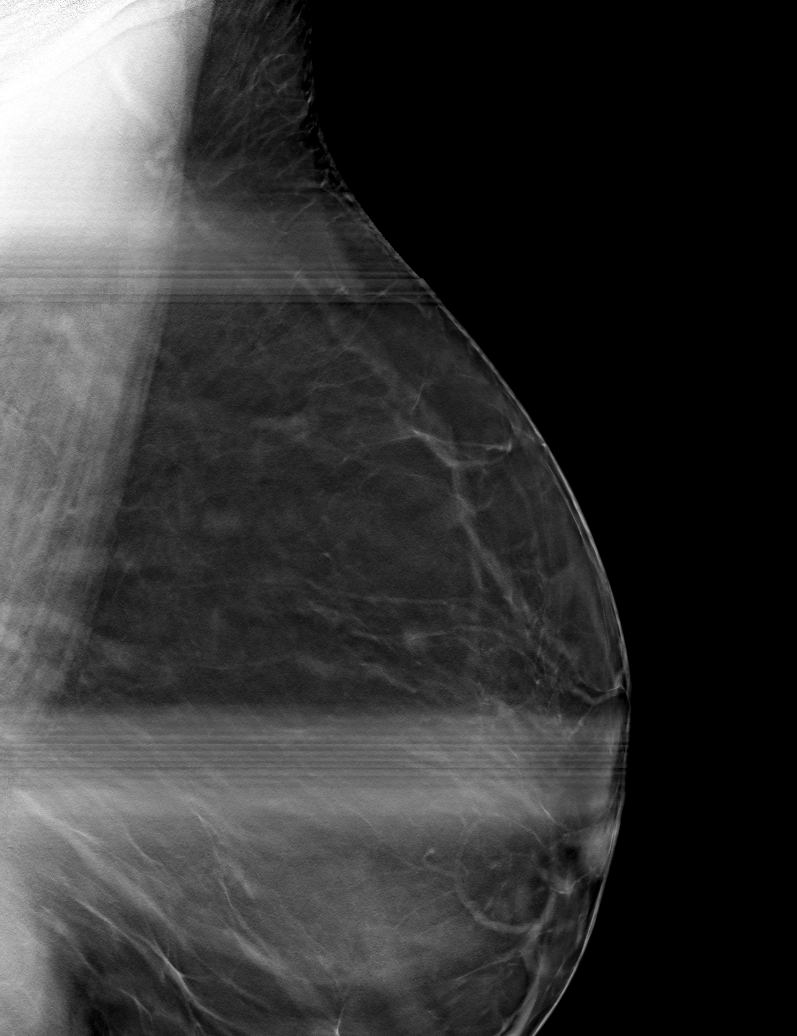

[L MLO tomo (2 of 2) · tomo slice 44/87.0]
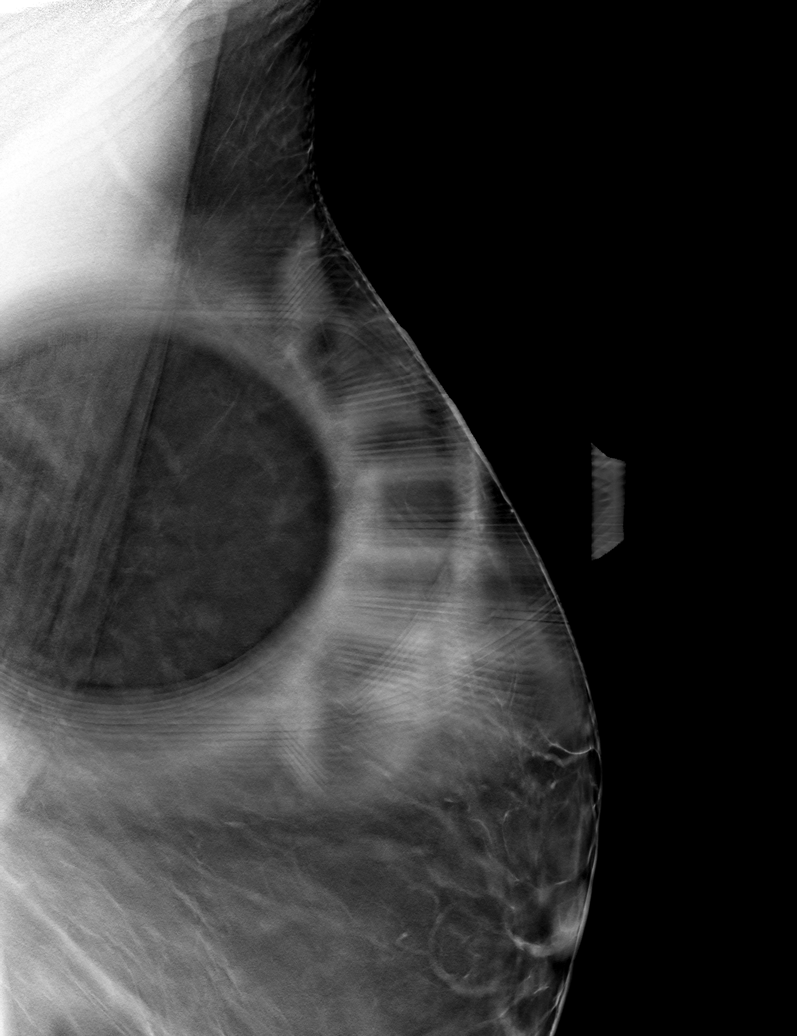

[L CC tomo · tomo slice 37/74.0]
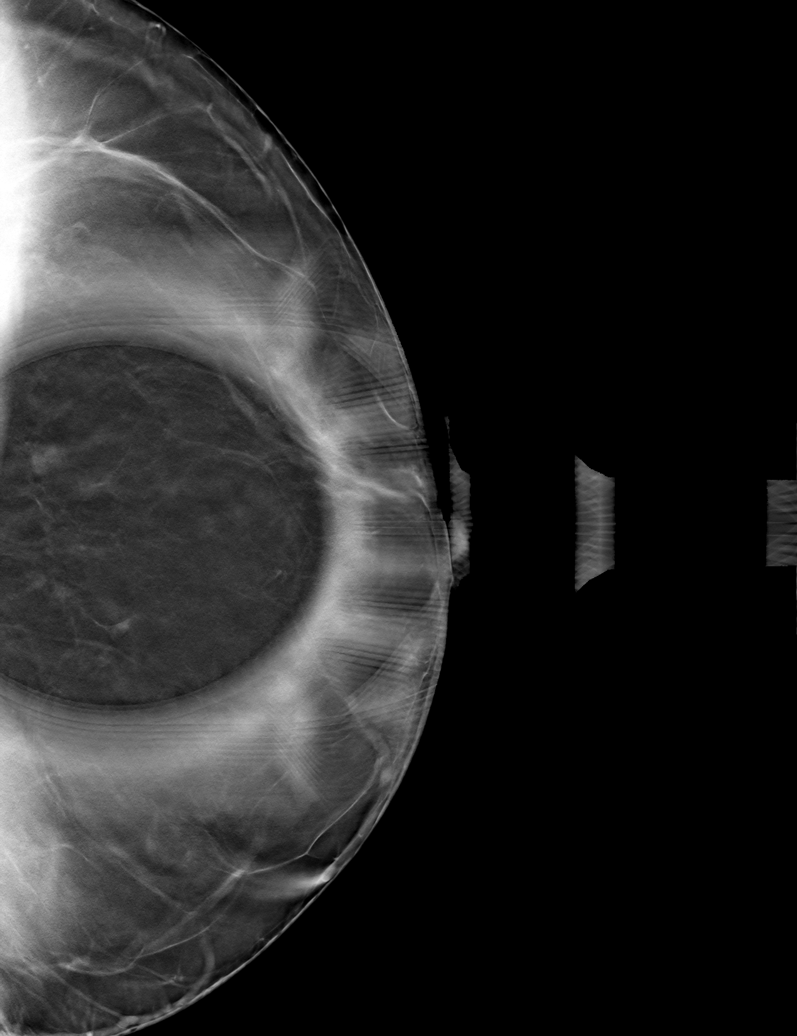

[6 of 18 positions shown; findings below may reference images not displayed]

ACR Breast Density Category b: There are scattered areas of
fibroglandular density.
FINDINGS: Spot compression CC and MLO views of the area of concern were
obtained.

There is an oval circumscribed isodense mass with possible central
fat in the upper breast at middle to posterior depth measuring
approximately 6 mm. There is no associated architectural distortion
or suspicious calcifications.

Targeted ultrasound was performed, demonstrating benign clustered
cysts at the 11 o'clock position 7 cm from nipple at middle depth
measuring approximately 6 x 4 x 5 mm, demonstrating posterior
acoustic enhancement and no internal power Doppler flow, accounting
for the screening mammographic finding. No suspicious solid mass or
abnormal acoustic shadowing is identified.
IMPRESSION: 1. No mammographic or sonographic evidence of malignancy involving
the LEFT breast.
2. Benign 6 mm clustered cysts at the 11 o'clock position 7 cm from
the nipple which accounts for the screening mammographic finding.

RECOMMENDATION:
Screening mammogram in one year.(Code:[CR])

I have discussed the findings and recommendations with the patient.
If applicable, a reminder letter will be sent to the patient
regarding the next appointment.

BI-RADS CATEGORY  2: Benign.

## 2021-07-09 NOTE — Patient Instructions (Addendum)
Blood work was ordered.     Medications changes include :   mounjaro 2.5 mg weekly  Your prescription(s) have been submitted to your pharmacy. Please take as directed and contact our office if you believe you are having problem(s) with the medication(s).    Please followup in 6 months    Health Maintenance, Female Adopting a healthy lifestyle and getting preventive care are important in promoting health and wellness. Ask your health care provider about: The right schedule for you to have regular tests and exams. Things you can do on your own to prevent diseases and keep yourself healthy. What should I know about diet, weight, and exercise? Eat a healthy diet  Eat a diet that includes plenty of vegetables, fruits, low-fat dairy products, and lean protein. Do not eat a lot of foods that are high in solid fats, added sugars, or sodium. Maintain a healthy weight Body mass index (BMI) is used to identify weight problems. It estimates body fat based on height and weight. Your health care provider can help determine your BMI and help you achieve or maintain a healthy weight. Get regular exercise Get regular exercise. This is one of the most important things you can do for your health. Most adults should: Exercise for at least 150 minutes each week. The exercise should increase your heart rate and make you sweat (moderate-intensity exercise). Do strengthening exercises at least twice a week. This is in addition to the moderate-intensity exercise. Spend less time sitting. Even light physical activity can be beneficial. Watch cholesterol and blood lipids Have your blood tested for lipids and cholesterol at 67 years of age, then have this test every 5 years. Have your cholesterol levels checked more often if: Your lipid or cholesterol levels are high. You are older than 67 years of age. You are at high risk for heart disease. What should I know about cancer screening? Depending on your  health history and family history, you may need to have cancer screening at various ages. This may include screening for: Breast cancer. Cervical cancer. Colorectal cancer. Skin cancer. Lung cancer. What should I know about heart disease, diabetes, and high blood pressure? Blood pressure and heart disease High blood pressure causes heart disease and increases the risk of stroke. This is more likely to develop in people who have high blood pressure readings or are overweight. Have your blood pressure checked: Every 3-5 years if you are 10-40 years of age. Every year if you are 67 years old or older. Diabetes Have regular diabetes screenings. This checks your fasting blood sugar level. Have the screening done: Once every three years after age 67 if you are at a normal weight and have a low risk for diabetes. More often and at a younger age if you are overweight or have a high risk for diabetes. What should I know about preventing infection? Hepatitis B If you have a higher risk for hepatitis B, you should be screened for this virus. Talk with your health care provider to find out if you are at risk for hepatitis B infection. Hepatitis C Testing is recommended for: Everyone born from [birth year] through 1965. Anyone with known risk factors for hepatitis C. Sexually transmitted infections (STIs) Get screened for STIs, including gonorrhea and chlamydia, if: You are sexually active and are younger than 67 years of age. You are older than 67 years of age and your health care provider tells you that you are at risk for this type of infection.  Your sexual activity has changed since you were last screened, and you are at increased risk for chlamydia or gonorrhea. Ask your health care provider if you are at risk. Ask your health care provider about whether you are at high risk for HIV. Your health care provider may recommend a prescription medicine to help prevent HIV infection. If you choose to take  medicine to prevent HIV, you should first get tested for HIV. You should then be tested every 3 months for as long as you are taking the medicine. Pregnancy If you are about to stop having your period (premenopausal) and you may become pregnant, seek counseling before you get pregnant. Take 400 to 800 micrograms (mcg) of folic acid every day if you become pregnant. Ask for birth control (contraception) if you want to prevent pregnancy. Osteoporosis and menopause Osteoporosis is a disease in which the bones lose minerals and strength with aging. This can result in bone fractures. If you are 33 years old or older, or if you are at risk for osteoporosis and fractures, ask your health care provider if you should: Be screened for bone loss. Take a calcium or vitamin D supplement to lower your risk of fractures. Be given hormone replacement therapy (HRT) to treat symptoms of menopause. Follow these instructions at home: Alcohol use Do not drink alcohol if: Your health care provider tells you not to drink. You are pregnant, may be pregnant, or are planning to become pregnant. If you drink alcohol: Limit how much you have to: 0-1 drink a day. Know how much alcohol is in your drink. In the [place] one drink equals one 12 oz bottle of beer (355 mL), one 5 oz glass of wine (148 mL), or one 1 oz glass of hard liquor (44 mL). Lifestyle Do not use any products that contain nicotine or tobacco. These products include cigarettes, chewing tobacco, and vaping devices, such as e-cigarettes. If you need help quitting, ask your health care provider. Do not use street drugs. Do not share needles. Ask your health care provider for help if you need support or information about quitting drugs. General instructions Schedule regular health, dental, and eye exams. Stay current with your vaccines. Tell your health care provider if: You often feel depressed. You have ever been abused or do not feel safe at  home. Summary Adopting a healthy lifestyle and getting preventive care are important in promoting health and wellness. Follow your health care provider's instructions about healthy diet, exercising, and getting tested or screened for diseases. Follow your health care provider's instructions on monitoring your cholesterol and blood pressure. This information is not intended to replace advice given to you by your health care provider. Make sure you discuss any questions you have with your health care provider. Document Revised: 11/03/2020 Document Reviewed: 11/03/2020 Elsevier Patient Education  [place].

## 2021-07-09 NOTE — Progress Notes (Signed)
Subjective:    Patient ID: Tami Hughes, female    DOB: 21-Aug-1955, 66 y.o.   MRN: [id number]   This visit occurred during the SARS-CoV-2 public health emergency.  Safety protocols were in place, including screening questions prior to the visit, additional usage of staff PPE, and extensive cleaning of exam room while observing appropriate contact time as indicated for disinfecting solutions.    HPI She is here for a physical exam.   She is worried about her weight and would like help with it.  She has been exercising regularly.  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  She also works full-time.  Medications and allergies reviewed with patient and updated if appropriate.  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   Prediabetes 11/11/2016   Herpes simplex without mention of complication 25/10/3974   Morbid obesity ([place]) 05/02/2012   Personal history of colonic polyps 01/27/2012   VENOUS INSUFFICIENCY, LEGS 08/28/2010   BRONCHITIS, ACUTE 08/22/2008   Hyperlipidemia 02/22/2008   Essential hypertension 02/22/2008   GERD 02/22/2008    Current Outpatient Medications on File Prior to Visit  Medication Sig Dispense Refill   diltiazem (CARDIZEM CD) 240 MG 24 hr capsule Take 1 capsule by mouth once daily 90 capsule 0   lisinopril-hydrochlorothiazide (ZESTORETIC) 20-25 MG tablet Take 1 tablet by mouth daily. 90 tablet 2   pantoprazole (PROTONIX) 40 MG tablet Take 1 tablet (40 mg total) by mouth daily. Take 30 minutes prior to a meal. 90 tablet 3   rosuvastatin (CRESTOR) 5 MG tablet Take 1 tablet by mouth once daily 90 tablet 0   triamcinolone cream (KENALOG) 0.1 % Apply 1 application topically 2 (two) times daily. 30 g 0   No current facility-administered medications on file prior to visit.    Past Medical History:  Diagnosis Date   Allergy    Arthritis    Dyslipidemia    GERD (gastroesophageal reflux  disease)    Hyperlipidemia    Hypertension    Polyp of colon 2009   adenoma benign   Shingles    recurrent: 08/2010, 10/2013    Past Surgical History:  Procedure Laterality Date   ABDOMINAL HYSTERECTOMY     TAH   CHOLECYSTECTOMY N/A 12/18/2014   Procedure: LAPAROSCOPIC CHOLECYSTECTOMY WITH INTRAOPERATIVE CHOLANGIOGRAM;  Surgeon: Johnathan Hausen, MD;  Location: [facility];  Service: General;  Laterality: N/A;   COLONOSCOPY     LABIOPLASTY     POLYPECTOMY     PUBOVAGINAL SLING  05/08/2010   stress urinary incontinence   VULVECTOMY     partial vulvectomy secondary to labial hypertrophy..    Social History   Socioeconomic History   Marital status: Widowed    Spouse name: Not on file   Number of children: 2   Years of education: 76   Highest education level: Not on file  Occupational History   Occupation: paralegal    Employer: [employer]  Tobacco Use   Smoking status: Never   Smokeless tobacco: Never  Substance and Sexual Activity   Alcohol use: Yes    Alcohol/week: 2.0 standard drinks    Types: 2 Standard drinks or equivalent per week    Comment: wine on weekends   Drug use: No   Sexual activity: Yes    Partners: Male  Other Topics Concern   Not on file  Social History Narrative   HS[employer], [employer] - BA, [employer] school -  masters in Energy manager. Married '74 - 27 yrs/widowed. 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 Has high potential for work but will finish master's first. Lives alone.       CPA   Widow, single   2 children, 2 grandchildren   No regular exercise   1 large cup of coffee daily   Social Determinants of Health   Financial Resource Strain: Not on file  Food Insecurity: Not on file  Transportation Needs: Not on file  Physical Activity: Not on file  Stress: Not on file  Social Connections: Not on file    Family History  Problem Relation Age of Onset   Kidney disease Mother        had transplant   Breast cancer Mother     Heart disease Sister    Heart attack Sister    Colon cancer Neg Hx    Esophageal cancer Neg Hx    Rectal cancer Neg Hx    Stomach cancer Neg Hx    Diabetes Neg Hx    Hyperlipidemia Neg Hx    COPD Neg Hx    Colon polyps Neg Hx     Review of Systems  Constitutional:  Negative for chills and fever.  Eyes:  Negative for visual disturbance.  Respiratory:  Negative for cough, shortness of breath and wheezing.   Cardiovascular:  Negative for chest pain, palpitations and leg swelling.  Gastrointestinal:  Negative for abdominal pain, blood in stool, constipation, diarrhea and nausea.  Genitourinary:  Negative for dysuria.  Musculoskeletal:  Positive for arthralgias (mild - knees). Negative for back pain.  Skin:  Negative for color change and rash.  Neurological:  Positive for headaches (occ - allergy related). Negative for dizziness and light-headedness.  Psychiatric/Behavioral:  Negative for dysphoric mood. The patient is nervous/anxious.       Objective:   Vitals:   07/10/21 0833  BP: 130/78  Pulse: 70  Temp: 98.3 F (36.8 C)  SpO2: 99%   Filed Weights   07/10/21 0833  Weight: 223 lb (101.2 kg)   Body mass index is 39.5 kg/m.  BP Readings from Last 3 Encounters:  07/10/21 130/78  12/31/20 128/72  11/27/20 92/65    Wt Readings from Last 3 Encounters:  07/10/21 223 lb (101.2 kg)  12/31/20 226 lb (102.5 kg)  11/27/20 217 lb (98.4 kg)    Depression screen [facility] 2/9 07/10/2021 02/12/2020 02/02/2019 11/15/2017 09/20/2016  Decreased Interest 0 0 0 0 0  Down, Depressed, Hopeless 1 0 0 0 0  PHQ - 2 Score 1 0 0 0 0  Altered sleeping 1 - - - -  Tired, decreased energy 1 - - - -  Change in appetite 2 - - - -  Feeling bad or failure about yourself  0 - - - -  Trouble concentrating 0 - - - -  Moving slowly or fidgety/restless 0 - - - -  Suicidal thoughts 0 - - - -  PHQ-9 Score 5 - - - -  Difficult doing work/chores Somewhat difficult - - - -     GAD 7 : Generalized  Anxiety Score 07/10/2021  Nervous, Anxious, on Edge 1  Control/stop worrying 1  Worry too much - different things 1  Trouble relaxing 1  Restless 1  Easily annoyed or irritable 0  Afraid - awful might happen 0  Total GAD 7 Score 5  Anxiety Difficulty Not difficult at all       Physical Exam  Constitutional: She appears well-developed and well-nourished. No distress.  HENT:  Head: Normocephalic and atraumatic.  Right Ear: External ear normal. Normal ear canal and TM Left Ear: External ear normal.  Normal ear canal and TM Mouth/Throat: Oropharynx is clear and moist.  Eyes: Conjunctivae and EOM are normal.  Neck: Neck supple. No tracheal deviation present. No thyromegaly present.  No carotid bruit  Cardiovascular: Normal rate, regular rhythm and normal heart sounds.   No murmur heard.  No edema. Pulmonary/Chest: Effort normal and breath sounds normal. No respiratory distress. She has no wheezes. She has no rales.  Breast: deferred   Abdominal: Soft. She exhibits no distension. There is no tenderness.  Lymphadenopathy: She has no cervical adenopathy.  Skin: Skin is warm and dry. She is not diaphoretic.  Psychiatric: She has a normal mood and affect. Her behavior is normal.     Lab Results  Component Value Date   WBC 5.7 07/10/2021   HGB 12.9 07/10/2021   HCT 39.6 07/10/2021   PLT 214.0 07/10/2021   GLUCOSE 105 (H) 07/10/2021   CHOL 163 07/10/2021   TRIG 116.0 07/10/2021   HDL 55.60 07/10/2021   LDLDIRECT 179.1 05/02/2012   LDLCALC 84 07/10/2021   ALT 32 07/10/2021   AST 29 07/10/2021   NA 140 07/10/2021   K 3.2 (L) 07/10/2021   CL 101 07/10/2021   CREATININE 1.04 07/10/2021   BUN 10 07/10/2021   CO2 29 07/10/2021   TSH 2.05 07/10/2021   HGBA1C 6.6 (H) 07/10/2021         Assessment & Plan:   Physical exam: Screening blood work  ordered Exercise  3-4 times a week for 45 min Weight  obese - discussed weight loss  Substance abuse  none  Screened for  depression using the PHQ 9 scale shows possible depression with a score of 5, but she denies any depression.  She is currently working full-time and working on her doctorate and is not getting enough sleep-often going to bed late so that she can do work and is tired because of that.  This whole process is overwhelming, but she denies depression.  No treatment needed-she does not feel like she needs anything to help and I agree since she is functioning so well at a high level.  We will monitor.  Screened for anxiety using GAD7 Scale , Shows possible depression with a score of 5.  She does have stress because she is so busy working and working on her doctorate, but denies true generalized anxiety-she feels anxious at times.  She does not feel that there is any need for treatment.  Will monitor.    Reviewed recommended immunizations.   Health Maintenance  Topic Date Due   INFLUENZA VACCINE  09/25/2021 (Originally 01/26/2021)   Zoster Vaccines- Shingrix (1 of 2) 10/08/2021 (Originally 01/22/2005)   Pneumonia Vaccine 93+ Years old (2 - PCV) 07/10/2022 (Originally 05/14/2010)   DEXA SCAN  07/10/2022 (Originally 05/18/2017)   TETANUS/TDAP  05/02/2022   MAMMOGRAM  06/10/2023   COLONOSCOPY (Pts 45-17yrs Insurance coverage will need to be confirmed)  11/27/2025   COVID-19 Vaccine  Completed   Hepatitis C Screening  Completed   HPV VACCINES  Aged Out          See Problem List for Assessment and Plan of chronic medical problems.

## 2021-07-10 ENCOUNTER — Encounter: Payer: Self-pay | Admitting: Internal Medicine

## 2021-07-10 ENCOUNTER — Ambulatory Visit (INDEPENDENT_AMBULATORY_CARE_PROVIDER_SITE_OTHER): Payer: BC Managed Care – PPO | Admitting: Internal Medicine

## 2021-07-10 ENCOUNTER — Telehealth: Payer: Self-pay

## 2021-07-10 ENCOUNTER — Other Ambulatory Visit: Payer: Self-pay

## 2021-07-10 VITALS — BP 130/78 | HR 70 | Temp 98.3°F | Ht 63.0 in | Wt 223.0 lb

## 2021-07-10 DIAGNOSIS — Z1331 Encounter for screening for depression: Secondary | ICD-10-CM | POA: Diagnosis not present

## 2021-07-10 DIAGNOSIS — Z0000 Encounter for general adult medical examination without abnormal findings: Secondary | ICD-10-CM

## 2021-07-10 DIAGNOSIS — R7303 Prediabetes: Secondary | ICD-10-CM | POA: Diagnosis not present

## 2021-07-10 DIAGNOSIS — K219 Gastro-esophageal reflux disease without esophagitis: Secondary | ICD-10-CM

## 2021-07-10 DIAGNOSIS — E876 Hypokalemia: Secondary | ICD-10-CM

## 2021-07-10 DIAGNOSIS — I10 Essential (primary) hypertension: Secondary | ICD-10-CM

## 2021-07-10 DIAGNOSIS — E782 Mixed hyperlipidemia: Secondary | ICD-10-CM

## 2021-07-10 LAB — CBC WITH DIFFERENTIAL/PLATELET
Basophils Absolute: 0 10*3/uL (ref 0.0–0.1)
Basophils Relative: 0.3 % (ref 0.0–3.0)
Eosinophils Absolute: 0.1 10*3/uL (ref 0.0–0.7)
Eosinophils Relative: 1.9 % (ref 0.0–5.0)
HCT: 39.6 % (ref 36.0–46.0)
Hemoglobin: 12.9 g/dL (ref 12.0–15.0)
Lymphocytes Relative: 45.6 % (ref 12.0–46.0)
Lymphs Abs: 2.6 10*3/uL (ref 0.7–4.0)
MCHC: 32.6 g/dL (ref 30.0–36.0)
MCV: 91 fl (ref 78.0–100.0)
Monocytes Absolute: 0.5 10*3/uL (ref 0.1–1.0)
Monocytes Relative: 8.8 % (ref 3.0–12.0)
Neutro Abs: 2.5 10*3/uL (ref 1.4–7.7)
Neutrophils Relative %: 43.4 % (ref 43.0–77.0)
Platelets: 214 10*3/uL (ref 150.0–400.0)
RBC: 4.35 Mil/uL (ref 3.87–5.11)
RDW: 13.9 % (ref 11.5–15.5)
WBC: 5.7 10*3/uL (ref 4.0–10.5)

## 2021-07-10 LAB — COMPREHENSIVE METABOLIC PANEL
ALT: 32 U/L (ref 0–35)
AST: 29 U/L (ref 0–37)
Albumin: 4.4 g/dL (ref 3.5–5.2)
Alkaline Phosphatase: 85 U/L (ref 39–117)
BUN: 10 mg/dL (ref 6–23)
CO2: 29 mEq/L (ref 19–32)
Calcium: 9.5 mg/dL (ref 8.4–10.5)
Chloride: 101 mEq/L (ref 96–112)
Creatinine, Ser: 1.04 mg/dL (ref 0.40–1.20)
GFR: 56.03 mL/min — ABNORMAL LOW (ref >60.00)
Glucose, Bld: 105 mg/dL — ABNORMAL HIGH (ref 70–99)
Potassium: 3.2 mEq/L — ABNORMAL LOW (ref 3.5–5.1)
Sodium: 140 mEq/L (ref 135–145)
Total Bilirubin: 0.9 mg/dL (ref 0.2–1.2)
Total Protein: 7.3 g/dL (ref 6.0–8.3)

## 2021-07-10 LAB — LIPID PANEL
Cholesterol: 163 mg/dL (ref 0–200)
HDL: 55.6 mg/dL (ref >39.00)
LDL Cholesterol: 84 mg/dL (ref 0–99)
NonHDL: 107.53
Total CHOL/HDL Ratio: 3
Triglycerides: 116 mg/dL (ref 0.0–149.0)
VLDL: 23.2 mg/dL (ref 0.0–40.0)

## 2021-07-10 LAB — TSH: TSH: 2.05 u[IU]/mL (ref 0.35–5.50)

## 2021-07-10 LAB — HEMOGLOBIN A1C: Hgb A1c MFr Bld: 6.6 % — ABNORMAL HIGH (ref 4.6–6.5)

## 2021-07-10 MED ORDER — MOUNJARO 2.5 MG/0.5ML ~~LOC~~ SOAJ: 2.5000 mg | SUBCUTANEOUS | 0 refills | Status: DC

## 2021-07-10 NOTE — Assessment & Plan Note (Signed)
Chronic Blood pressure well controlled CMP Continue diltiazem to 40 mg daily, lisinopril-HCT 20-25 mg daily

## 2021-07-10 NOTE — Assessment & Plan Note (Addendum)
Chronic GERD not always controlled Continue pantoprazole 40 mg daily, advised to add pepcid 20 mg daily prn

## 2021-07-10 NOTE — Telephone Encounter (Signed)
[name] Key: [id number] - PA Case ID: [id number] - Rx #: [id number]

## 2021-07-10 NOTE — Assessment & Plan Note (Signed)
Chronic Regular exercise and healthy diet encouraged Check lipid panel  Continue Crestor 5 mg daily 

## 2021-07-10 NOTE — Assessment & Plan Note (Signed)
Chronic Check a1c Low sugar / carb diet Stressed regular exercise  

## 2021-07-10 NOTE — Assessment & Plan Note (Signed)
Chronic Morbidly obese with BMI of 39.5 and hypertension, hyperlipidemia and prediabetes She is exercising regularly She is trying to eat well and watch her portions, but is still struggling with weight loss and really wants help Discussed different medication options Her A1c is in the diabetic range today Trial of mounjaro-started 2.5 mg weekly for 1 month and then increase to 5 mg weekly Discussed possible side effects She will obtain a savings card

## 2021-07-11 DIAGNOSIS — E876 Hypokalemia: Secondary | ICD-10-CM | POA: Insufficient documentation

## 2021-07-11 MED ORDER — POTASSIUM CHLORIDE CRYS ER 10 MEQ PO TBCR: 10.0000 meq | EXTENDED_RELEASE_TABLET | Freq: Two times a day (BID) | ORAL | 1 refills | Status: DC

## 2021-07-11 NOTE — Addendum Note (Signed)
Addended by: Binnie Rail on: 07/11/2021 04:08 PM   Modules accepted: Orders

## 2021-07-13 ENCOUNTER — Encounter: Payer: Self-pay | Admitting: Internal Medicine

## 2021-07-13 MED ORDER — OZEMPIC (0.25 OR 0.5 MG/DOSE) 2 MG/1.5ML ~~LOC~~ SOPN: PEN_INJECTOR | SUBCUTANEOUS | 0 refills | Status: DC

## 2021-07-13 NOTE — Telephone Encounter (Signed)
Patient states ozempic is a pre approved medication by her insurance company  Patient requesting a rx sent to [facility] ([place] [place]  Patient states pharmacy informed her the medication is low in stock and goes "pretty fast"  *see below*

## 2021-07-14 ENCOUNTER — Other Ambulatory Visit: Payer: BC Managed Care – PPO

## 2021-07-14 ENCOUNTER — Encounter: Payer: Self-pay | Admitting: Internal Medicine

## 2021-07-15 ENCOUNTER — Encounter: Payer: Self-pay | Admitting: Internal Medicine

## 2021-07-17 ENCOUNTER — Telehealth: Payer: Self-pay

## 2021-07-17 NOTE — Telephone Encounter (Signed)
Tami Hughes (Key: [id number])  Your information has been submitted to [employer]. [employer] is reviewing the PA request and you will receive an electronic response. You may check for the updated outcome later by reopening this request. The standard fax determination will also be sent to you directly.  If you have any questions about your PA submission, contact [employer] at [phone].

## 2021-07-20 ENCOUNTER — Encounter: Payer: Self-pay | Admitting: Internal Medicine

## 2021-07-20 DIAGNOSIS — E119 Type 2 diabetes mellitus without complications: Secondary | ICD-10-CM

## 2021-07-21 DIAGNOSIS — Z1231 Encounter for screening mammogram for malignant neoplasm of breast: Secondary | ICD-10-CM

## 2021-07-30 NOTE — Telephone Encounter (Signed)
Rep w/ [employer] states PA for ozempic and mounjaro was denied, rep states they will fax over a list of alternative medications that are covered  Fax number was provided   Phone [phone]

## 2021-08-05 MED ORDER — METFORMIN HCL 500 MG PO TABS: 500.0000 mg | ORAL_TABLET | Freq: Two times a day (BID) | ORAL | 3 refills | Status: DC

## 2021-08-05 NOTE — Addendum Note (Signed)
Addended by: Binnie Rail on: 08/05/2021 09:19 PM   Modules accepted: Orders

## 2021-08-06 ENCOUNTER — Other Ambulatory Visit: Payer: Self-pay | Admitting: Internal Medicine

## 2021-08-06 NOTE — Telephone Encounter (Signed)
Rep w/ [employer] called requesting clarification if provider prescribed metformin as an alternative medication to ozempic  Advised rep according to messages (below) that is correct, rep states if the information I provided is incorrect please call back   Phone: [phone]

## 2021-09-06 ENCOUNTER — Encounter: Payer: Self-pay | Admitting: Internal Medicine

## 2021-09-08 MED ORDER — FLUCONAZOLE 150 MG PO TABS: 150.0000 mg | ORAL_TABLET | Freq: Once | ORAL | 0 refills | Status: AC

## 2021-09-11 ENCOUNTER — Other Ambulatory Visit: Payer: Self-pay | Admitting: Internal Medicine

## 2021-09-11 MED ORDER — FLUCONAZOLE 150 MG PO TABS: 150.0000 mg | ORAL_TABLET | Freq: Once | ORAL | 0 refills | Status: AC

## 2021-09-11 NOTE — Addendum Note (Signed)
Addended by: Binnie Rail on: 09/11/2021 12:19 PM ? ? Modules accepted: Orders ? ?

## 2021-09-18 ENCOUNTER — Other Ambulatory Visit: Payer: Self-pay | Admitting: Urology

## 2021-09-18 DIAGNOSIS — N760 Acute vaginitis: Secondary | ICD-10-CM | POA: Diagnosis not present

## 2021-09-18 DIAGNOSIS — N898 Other specified noninflammatory disorders of vagina: Secondary | ICD-10-CM | POA: Diagnosis not present

## 2021-09-18 DIAGNOSIS — N281 Cyst of kidney, acquired: Secondary | ICD-10-CM

## 2021-09-21 DIAGNOSIS — Z118 Encounter for screening for other infectious and parasitic diseases: Secondary | ICD-10-CM | POA: Diagnosis not present

## 2021-09-24 ENCOUNTER — Encounter: Payer: Self-pay | Admitting: Internal Medicine

## 2021-10-21 ENCOUNTER — Other Ambulatory Visit: Payer: Self-pay | Admitting: Internal Medicine

## 2021-10-30 ENCOUNTER — Ambulatory Visit
Admission: RE | Admit: 2021-10-30 | Discharge: 2021-10-30 | Disposition: A | Discharge status: Home / Self Care | Payer: BC Managed Care – PPO | Source: Ambulatory Visit | Attending: Urology | Admitting: Urology

## 2021-10-30 DIAGNOSIS — N281 Cyst of kidney, acquired: Secondary | ICD-10-CM

## 2021-10-30 IMAGING — MR MR ABDOMEN WO/W CM
18 series · 47 of 48 positions shown · IV contrast (20ml Multihance)
Comparison: MRI abdomen [DATE]

CLINICAL DATA: Follow-up renal cysts.

EXAM:
MRI ABDOMEN WITHOUT AND WITH CONTRAST
TECHNIQUE: Multiplanar multisequence MR imaging of the abdomen was performed
both before and after the administration of intravenous contrast.
CONTRAST:  20mL MULTIHANCE GADOBENATE DIMEGLUMINE 529 MG/ML IV SOLN

[Series 3: T2 · coronal · 5.0mm · 1.56mm/px · 1 of 28 slices shown (1 of 3)]
[im 1/28]
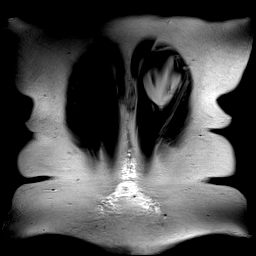

[Series 4: T1 · axial · 3.0mm · 1.34mm/px · z∈[-46,+143]mm · 4 of 128 slices shown]
[im 1/128]
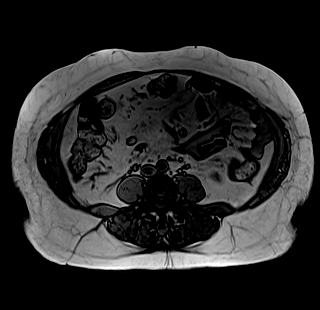
[im 43/128]
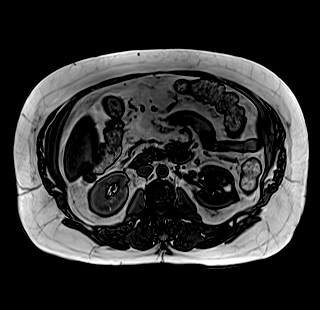
[im 85/128]
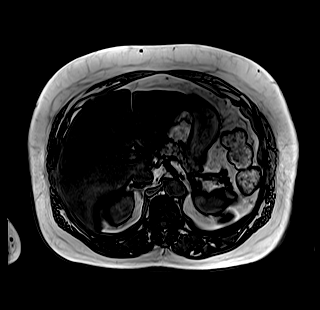
[im 128/128]
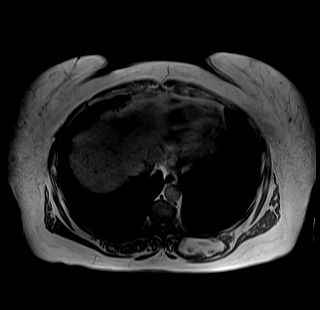

[Series 5: bSSFP · axial · 5.0mm · 1.34mm/px · 1 of 34 slices shown (1 of 2)]
[im 1/34]
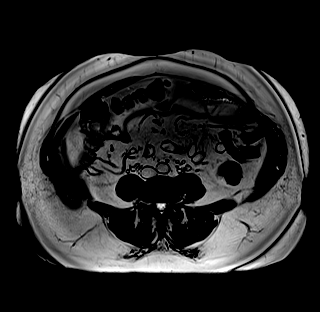

[Series 6: T2 · axial · 6.0mm · 1.34mm/px · 1 of 28 slices shown (2 of 3)]
[im 1/28]
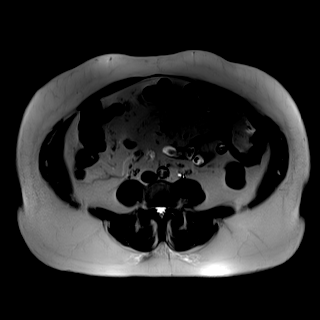

[Series 7: T2 · axial · 5.0mm · 1.68mm/px · z∈[-43,+233]mm · 2 of 47 slices shown (3 of 3)]
[im 1/47]
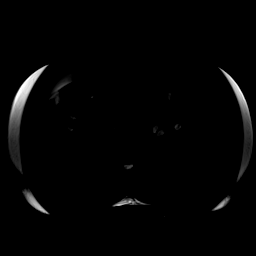
[im 47/47]
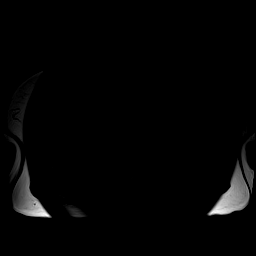

[Series 8: DWI · axial · 5.0mm · 1.60mm/px · z∈[-43,+233]mm · 6 of 141 slices shown (1 of 2)]
[im 1/141]
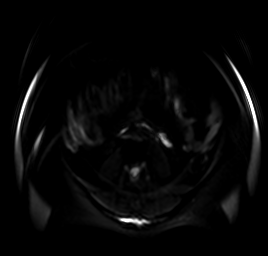
[im 29/141]
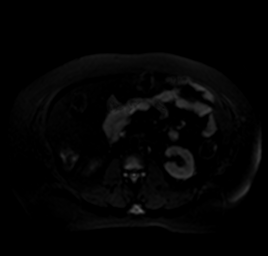
[im 57/141]
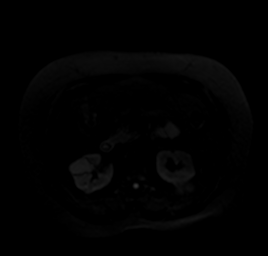
[im 85/141]
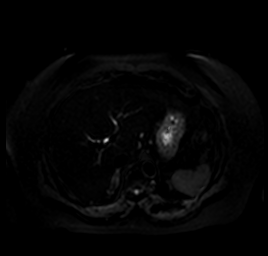
[im 113/141]
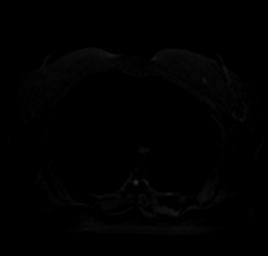
[im 141/141]
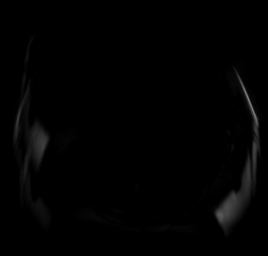

[Series 9: DWI · axial · 5.0mm · 1.60mm/px · z∈[-43,+233]mm · 2 of 47 slices shown (2 of 2)]
[im 1/47]
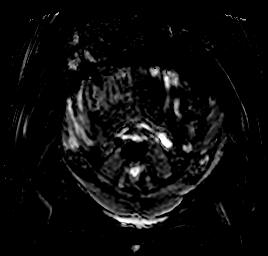
[im 47/47]
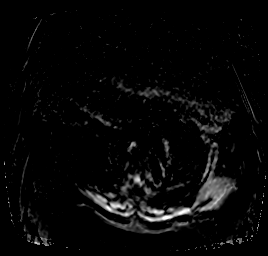

[Series 10: bSSFP · axial · 5.0mm · 1.34mm/px · 1 of 34 slices shown (2 of 2)]
[im 1/34]
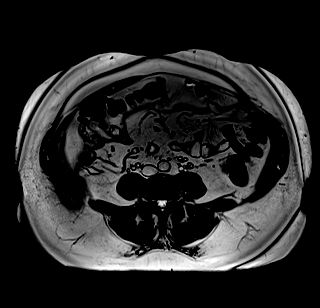

[Series 11: T1 dynamic · axial · non-contrast · 3.0mm · 1.34mm/px · z∈[-67,+146]mm · 3 of 72 slices shown]
[im 1/72]
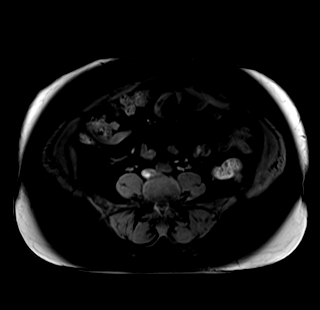
[im 36/72]
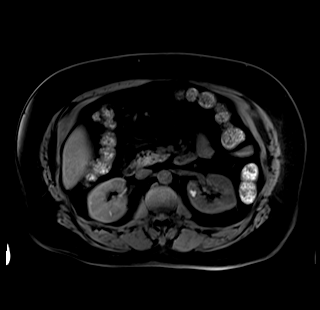
[im 72/72]
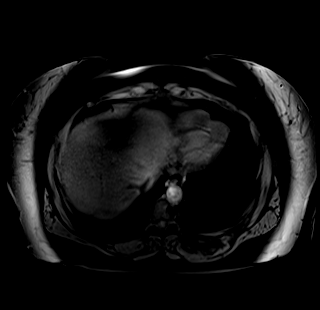

[Series 12: T1 dynamic post-contrast · axial · 3.0mm · 1.34mm/px · z∈[-67,+146]mm · 3 of 72 slices shown (1 of 9)]
[im 1/72]
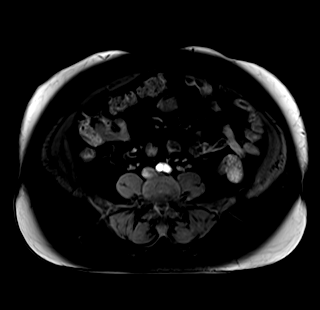
[im 36/72]
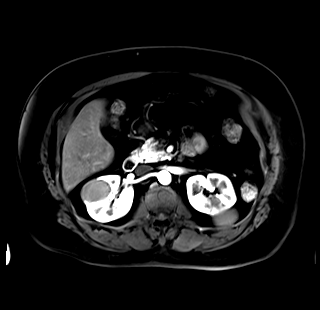
[im 72/72]
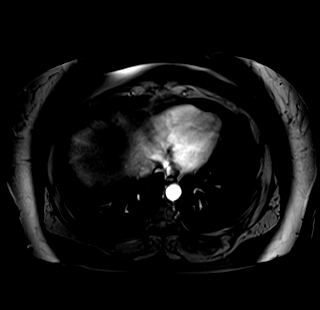

[Series 13: T1 dynamic post-contrast · axial · 3.0mm · 1.34mm/px · z∈[-67,+146]mm · 3 of 72 slices shown (2 of 9)]
[im 1/72]
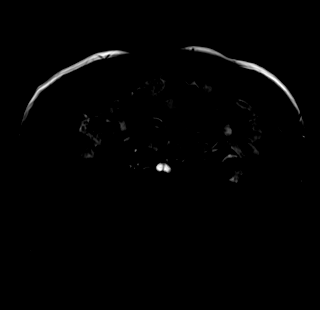
[im 36/72]
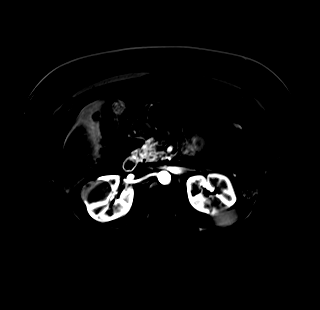
[im 72/72]
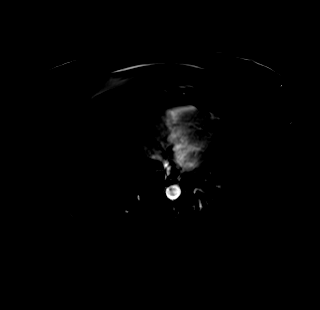

[Series 14: T1 dynamic post-contrast · axial · 3.0mm · 1.34mm/px · z∈[-67,+146]mm · 3 of 72 slices shown (3 of 9)]
[im 1/72]
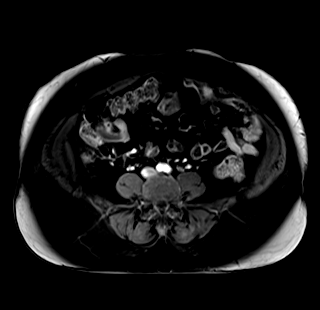
[im 36/72]
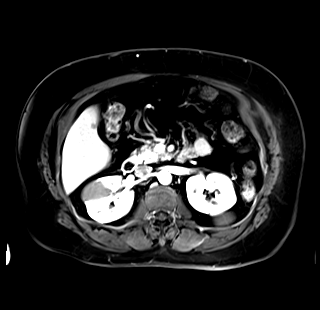
[im 72/72]
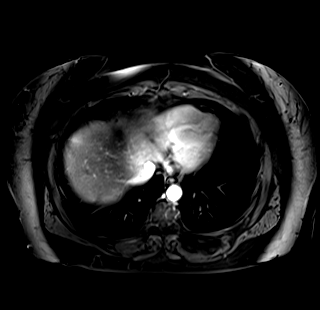

[Series 15: T1 dynamic post-contrast · axial · 3.0mm · 1.34mm/px · z∈[-67,+146]mm · 3 of 72 slices shown (4 of 9)]
[im 1/72]
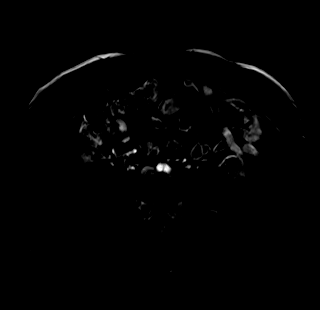
[im 36/72]
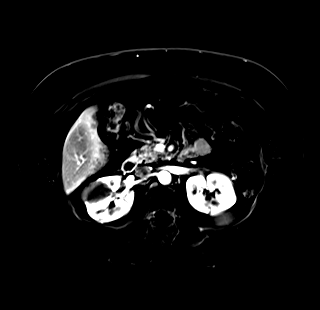
[im 72/72]
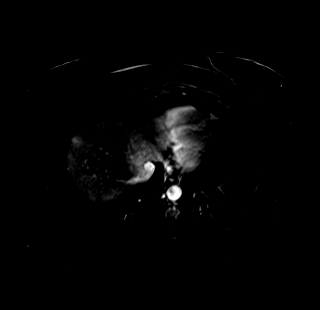

[Series 16: T1 dynamic post-contrast · axial · 3.0mm · 1.34mm/px · z∈[-67,+146]mm · 3 of 72 slices shown (5 of 9)]
[im 1/72]
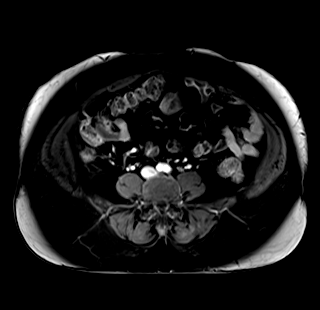
[im 36/72]
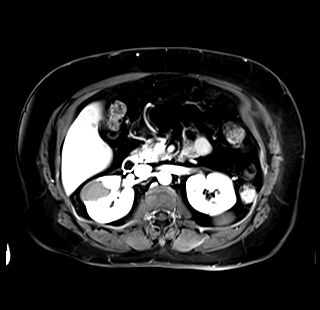
[im 72/72]
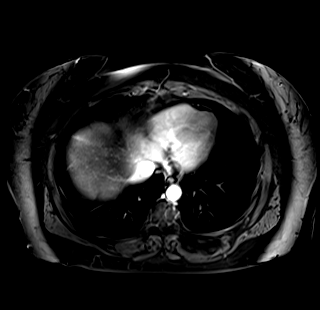

[Series 17: T1 dynamic post-contrast · axial · 3.0mm · 1.34mm/px · z∈[-67,+146]mm · 3 of 72 slices shown (6 of 9)]
[im 1/72]
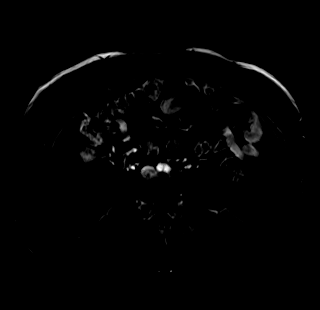
[im 36/72]
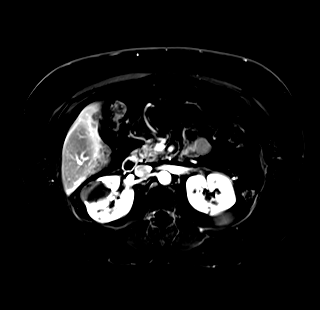
[im 72/72]
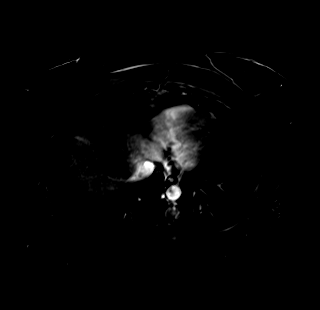

[Series 18: T1 dynamic post-contrast · coronal · 3.0mm · 1.25mm/px · 3 of 80 slices shown (7 of 9)]
[im 1/80]
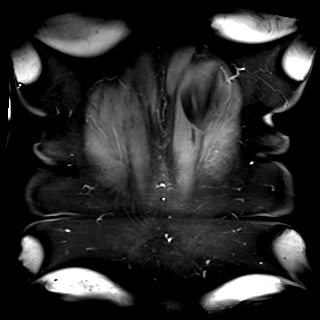
[im 40/80]
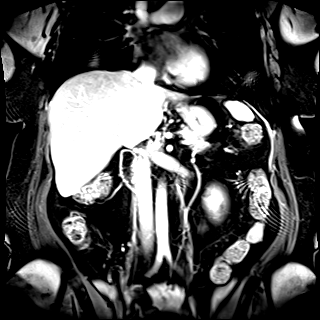
[im 80/80]
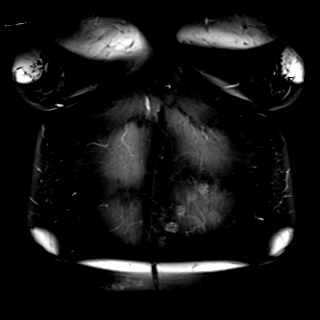

[Series 19: T1 dynamic post-contrast · axial · 3.0mm · 1.34mm/px · z∈[-67,+146]mm · 3 of 72 slices shown (8 of 9)]
[im 1/72]
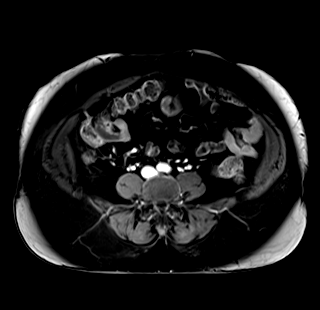
[im 36/72]
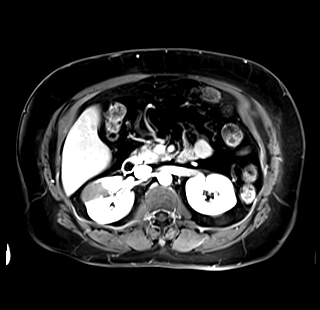
[im 72/72]
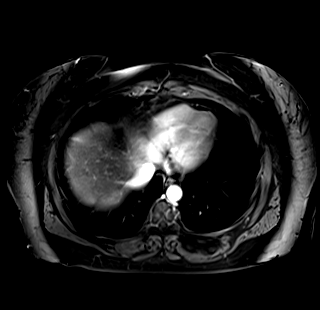

[Series 20: T1 dynamic post-contrast · axial · 3.0mm · 1.34mm/px · z∈[-67,+38]mm · 2 of 72 slices shown (9 of 9)]
[im 1/72]
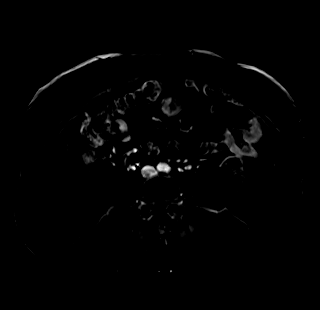
[im 36/72]
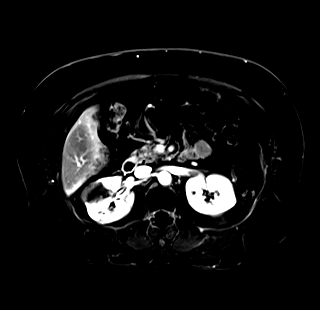

[47 of 48 positions shown; findings below may reference images not displayed]

FINDINGS: Lower chest: No acute findings.

Hepatobiliary: Liver is normal in size with evidence of diffuse
hepatic steatosis. There is a subcentimeter hyperintense T2 signal
lesion in the right hepatic lobe near the IVC which demonstrates
early and persistent postcontrast enhancement, unchanged since
previous study and likely a hemangioma. Gallbladder is surgically
absent. No biliary ductal dilatation.

Pancreas: No mass, inflammatory changes, or other parenchymal
abnormality identified.

Spleen:  Within normal limits in size and appearance.

Adrenals/Urinary Tract: Adrenal glands are normal. Multiple simple
and mildly complex renal cortical cysts identified bilaterally
including varying degrees of increased T1 signal
proteinaceous/hemorrhagic cysts, and a few cysts with a few thin
internal septations. Largest cysts measure 4.1 cm on the right which
demonstrates several internal thin septations and 4.2 cm on the
left. No suspicious enhancement visualized within the cysts. No
hydronephrosis.

Stomach/Bowel: Visualized portions within the abdomen are
unremarkable.

Vascular/Lymphatic: No pathologically enlarged lymph nodes
identified. No abdominal aortic aneurysm demonstrated.

Other:  No ascites.

Musculoskeletal: No suspicious bone lesions identified.
IMPRESSION: 1. Multiple stable complex renal cortical cysts. Recommend continued
annual follow-up as long as remain stable, for a total of 5 years.
2. Hepatic steatosis. Stable subcentimeter flash hemangioma in the
right hepatic lobe.

## 2021-10-30 MED ORDER — GADOBENATE DIMEGLUMINE 529 MG/ML IV SOLN
20.0000 mL | Freq: Once | INTRAVENOUS | Status: AC | PRN
  Administered 2021-10-30: 20 mL via INTRAVENOUS

## 2021-11-10 DIAGNOSIS — N281 Cyst of kidney, acquired: Secondary | ICD-10-CM | POA: Diagnosis not present

## 2021-12-11 ENCOUNTER — Other Ambulatory Visit: Payer: Self-pay | Admitting: Internal Medicine

## 2021-12-31 ENCOUNTER — Other Ambulatory Visit: Payer: Self-pay | Admitting: Internal Medicine

## 2022-01-14 ENCOUNTER — Other Ambulatory Visit: Payer: Self-pay | Admitting: Internal Medicine

## 2022-01-23 ENCOUNTER — Other Ambulatory Visit: Payer: Self-pay | Admitting: Internal Medicine

## 2022-02-12 DIAGNOSIS — N760 Acute vaginitis: Secondary | ICD-10-CM | POA: Diagnosis not present

## 2022-02-12 DIAGNOSIS — B3731 Acute candidiasis of vulva and vagina: Secondary | ICD-10-CM | POA: Diagnosis not present

## 2022-02-12 DIAGNOSIS — N898 Other specified noninflammatory disorders of vagina: Secondary | ICD-10-CM | POA: Diagnosis not present

## 2022-02-22 ENCOUNTER — Emergency Department (HOSPITAL_COMMUNITY): Payer: BC Managed Care – PPO

## 2022-02-22 ENCOUNTER — Encounter (HOSPITAL_COMMUNITY): Payer: Self-pay

## 2022-02-22 ENCOUNTER — Ambulatory Visit
Admission: EM | Admit: 2022-02-22 | Discharge: 2022-02-22 | Disposition: A | Discharge status: Home / Self Care | Payer: BC Managed Care – PPO

## 2022-02-22 ENCOUNTER — Emergency Department (HOSPITAL_COMMUNITY)
Admission: EM | Admit: 2022-02-22 | Discharge: 2022-02-23 | Discharge status: Against Medical Advice | Payer: BC Managed Care – PPO | Attending: Emergency Medicine | Admitting: Emergency Medicine

## 2022-02-22 ENCOUNTER — Other Ambulatory Visit: Payer: Self-pay

## 2022-02-22 DIAGNOSIS — Z5321 Procedure and treatment not carried out due to patient leaving prior to being seen by health care provider: Secondary | ICD-10-CM | POA: Insufficient documentation

## 2022-02-22 DIAGNOSIS — R0789 Other chest pain: Secondary | ICD-10-CM | POA: Diagnosis not present

## 2022-02-22 DIAGNOSIS — R079 Chest pain, unspecified: Secondary | ICD-10-CM | POA: Diagnosis not present

## 2022-02-22 DIAGNOSIS — K30 Functional dyspepsia: Secondary | ICD-10-CM | POA: Diagnosis not present

## 2022-02-22 LAB — BASIC METABOLIC PANEL
Anion gap: 9 (ref 5–15)
BUN: 12 mg/dL (ref 8–23)
CO2: 26 mmol/L (ref 22–32)
Calcium: 9.3 mg/dL (ref 8.9–10.3)
Chloride: 103 mmol/L (ref 98–111)
Creatinine, Ser: 1.08 mg/dL — ABNORMAL HIGH (ref 0.44–1.00)
GFR, Estimated: 56 mL/min — ABNORMAL LOW (ref >60)
Glucose, Bld: 101 mg/dL — ABNORMAL HIGH (ref 70–99)
Potassium: 3.1 mmol/L — ABNORMAL LOW (ref 3.5–5.1)
Sodium: 138 mmol/L (ref 135–145)

## 2022-02-22 LAB — CBC
HCT: 39.8 % (ref 36.0–46.0)
Hemoglobin: 13.5 g/dL (ref 12.0–15.0)
MCH: 30.9 pg (ref 26.0–34.0)
MCHC: 33.9 g/dL (ref 30.0–36.0)
MCV: 91.1 fL (ref 80.0–100.0)
Platelets: 204 10*3/uL (ref 150–400)
RBC: 4.37 MIL/uL (ref 3.87–5.11)
RDW: 13.2 % (ref 11.5–15.5)
WBC: 6.6 10*3/uL (ref 4.0–10.5)
nRBC: 0 % (ref 0.0–0.2)

## 2022-02-22 LAB — TROPONIN I (HIGH SENSITIVITY): Troponin I (High Sensitivity): 7 ng/L (ref <18)

## 2022-02-22 NOTE — ED Triage Notes (Signed)
Reports having central chest pain x 1 week.  Reports she thought it was indigestion but it wont go away.  Denies pain radiating anywhere.  Was seen at [facility] who sent her here for evaluation.

## 2022-02-22 NOTE — ED Triage Notes (Signed)
Pt c/o chest pain x 1 week. Thought it was indigestion. States has been trying rolaids, nexium, pepcid without relief so wants to make sure it's not cardiac related. Has apt w/ provider on Thursday as well. Denies pain but feels like she needs to belch.   Was on protonix x 6 months and told to stop around January since sxs resolved.

## 2022-02-22 NOTE — ED Provider Notes (Signed)
[facility]    CSN: [id number] Arrival date & time: 02/22/22  1621      History   Chief Complaint Chief Complaint  Patient presents with   Chest Pain    HPI Tami Hughes is a 66 y.o. female.   Patient here today for evaluation of ongoing chest discomfort that she describes as a feeling of indigestion she has had for the last week.  She is not experiencing shortness of breath.  She was previously treated with pantoprazole but has not taken since January.  She is not sure if this is related to her symptoms or not.  The history is provided by the patient.  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   Diabetes ([place]) 11/11/2016   Herpes simplex without mention of complication 58/85/0277   Morbid obesity ([place]) 05/02/2012   Personal history of colonic polyps 01/27/2012   VENOUS INSUFFICIENCY, LEGS 08/28/2010   BRONCHITIS, ACUTE 08/22/2008   Hyperlipidemia 02/22/2008   Essential hypertension 02/22/2008   GERD 02/22/2008    Past Surgical History:  Procedure Laterality Date   ABDOMINAL HYSTERECTOMY     TAH   CHOLECYSTECTOMY N/A 12/18/2014   Procedure: LAPAROSCOPIC CHOLECYSTECTOMY WITH INTRAOPERATIVE CHOLANGIOGRAM;  Surgeon: Johnathan Hausen, MD;  Location: [facility];  Service: General;  Laterality: N/A;   COLONOSCOPY     LABIOPLASTY     POLYPECTOMY     PUBOVAGINAL SLING  05/08/2010   stress urinary incontinence   VULVECTOMY     partial vulvectomy secondary to labial hypertrophy..    OB History     Gravida  2   Para  2   Term  2   Preterm       AB      Living  2      SAB      IAB      Ectopic      Multiple      Live Births  2            Home Medications    Prior to Admission medications   Medication Sig Start Date End Date Taking? Authorizing Provider  diltiazem (CARDIZEM CD) 240 MG 24 hr capsule Take 1 capsule by mouth once daily 12/15/21   Binnie Rail, MD  lisinopril-hydrochlorothiazide (ZESTORETIC) 20-25 MG tablet Take 1 tablet by mouth once daily 01/25/22   Binnie Rail, MD  metFORMIN (GLUCOPHAGE) 500 MG tablet Take 1 tablet (500 mg total) by mouth 2 (two) times daily with a meal. 08/05/21   Burns, Claudina Lick, MD  pantoprazole (PROTONIX) 40 MG tablet Take 1 tablet (40 mg total) by mouth daily. Take 30 minutes prior to a meal. 10/30/20   Burns, Claudina Lick, MD  potassium chloride (KLOR-CON M) 10 MEQ tablet Take 1 tablet by mouth twice daily 12/31/21   Binnie Rail, MD  rosuvastatin (CRESTOR) 5 MG tablet Take 1 tablet by mouth once daily 01/14/22   Binnie Rail, MD  triamcinolone cream (KENALOG) 0.1 %  Apply 1 application topically 2 (two) times daily. 02/02/19   Binnie Rail, MD    Family History Family History  Problem Relation Age of Onset   Kidney disease Mother        had transplant   Breast cancer Mother    Heart disease Sister    Heart attack Sister    Colon cancer Neg Hx    Esophageal cancer Neg Hx    Rectal cancer Neg Hx    Stomach cancer Neg Hx    Diabetes Neg Hx    Hyperlipidemia Neg Hx    COPD Neg Hx    Colon polyps Neg Hx     Social History Social History   Tobacco Use   Smoking status: Never   Smokeless tobacco: Never  Substance Use Topics   Alcohol use: Yes    Alcohol/week: 2.0 standard drinks of alcohol    Types: 2 Standard drinks or equivalent per week    Comment: wine on weekends   Drug use: No     Allergies   Patient has no known allergies.   Review of Systems Review of Systems  Constitutional:  Negative for chills and fever.  Eyes:  Negative for discharge and redness.   Respiratory:  Negative for shortness of breath.   Cardiovascular:  Positive for chest pain.  Gastrointestinal:        Positive for indigestion     Physical Exam Triage Vital Signs ED Triage Vitals [02/22/22 1720]  Enc Vitals Group     BP (!) 162/87     Pulse Rate 75     Resp 20     Temp 97.8 F (36.6 C)     Temp Source Oral     SpO2 96 %     Weight      Height      Head Circumference      Peak Flow      Pain Score 0     Pain Loc      Pain Edu?      Excl. in [place]?    No data found.  Updated Vital Signs BP (!) 162/87 (BP Location: Left Arm)   Pulse 75   Temp 97.8 F (36.6 C) (Oral)   Resp 20   SpO2 96%      Physical Exam Vitals and nursing note reviewed.  Constitutional:      General: She is not in acute distress.    Appearance: Normal appearance. She is not ill-appearing.  HENT:     Head: Normocephalic and atraumatic.  Eyes:     Conjunctiva/sclera: Conjunctivae normal.  Cardiovascular:     Rate and Rhythm: Normal rate.  Pulmonary:     Effort: Pulmonary effort is normal.  Neurological:     Mental Status: She is alert.  Psychiatric:        Mood and Affect: Mood normal.        Behavior: Behavior normal.        Thought Content: Thought content normal.      UC Treatments / Results  Labs (all labs ordered are listed, but only abnormal results are displayed) Labs Reviewed - No data to display  EKG   Radiology No results found.  Procedures Procedures (including critical care time)  Medications Ordered in UC Medications - No data to display  Initial Impression / Assessment and Plan / UC Course  I have reviewed the triage vital signs and the nursing notes.  Pertinent labs & imaging results that  were available during my care of the patient were reviewed by me and considered in my medical decision making (see chart for details).    Patient is stable in office and EKG without significant concerning findings however given symptoms, age, other risk  factors (diabetes, HLD, HTN) recommended further evaluation in the emergency department for troponins etc.  Patient is agreeable to same and will transport to [facility], ED, POV.  Final Clinical Impressions(s) / UC Diagnoses   Final diagnoses:  Indigestion   Discharge Instructions   None    ED Prescriptions   None    PDMP not reviewed this encounter.   Francene Finders, PA-C 02/22/22 1747

## 2022-02-23 NOTE — ED Notes (Signed)
Pt left AMA °

## 2022-02-24 ENCOUNTER — Encounter: Payer: Self-pay | Admitting: Internal Medicine

## 2022-02-24 DIAGNOSIS — R079 Chest pain, unspecified: Secondary | ICD-10-CM | POA: Insufficient documentation

## 2022-02-24 NOTE — Progress Notes (Unsigned)
    Subjective:    Patient ID: Tami Hughes, female    DOB: 04/22/1956, 66 y.o.   MRN: [id number]      HPI Canda is here for  Chief Complaint  Patient presents with   Chest Pain    02/22/2022-went to urgent care for chest pain.  Ongoing for 1 week-felt like indigestion.  No shortness of breath.  EKG without changes.  Advised emergency room visit and was transported to ED.  Chest x-ray negative.  Troponin x1 negative, CBC normal, potassium and GFR slightly low.  EKG without significant changes.  She left prior to being seen.       Medications and allergies reviewed with patient and updated if appropriate.  Current Outpatient Medications on File Prior to Visit  Medication Sig Dispense Refill   diltiazem (CARDIZEM CD) 240 MG 24 hr capsule Take 1 capsule by mouth once daily 90 capsule 2   lisinopril-hydrochlorothiazide (ZESTORETIC) 20-25 MG tablet Take 1 tablet by mouth once daily 90 tablet 0   metFORMIN (GLUCOPHAGE) 500 MG tablet Take 1 tablet (500 mg total) by mouth 2 (two) times daily with a meal. 180 tablet 3   pantoprazole (PROTONIX) 40 MG tablet Take 1 tablet (40 mg total) by mouth daily. Take 30 minutes prior to a meal. 90 tablet 3   potassium chloride (KLOR-CON M) 10 MEQ tablet Take 1 tablet by mouth twice daily 180 tablet 0   rosuvastatin (CRESTOR) 5 MG tablet Take 1 tablet by mouth once daily 90 tablet 1   triamcinolone cream (KENALOG) 0.1 % Apply 1 application topically 2 (two) times daily. 30 g 0   No current facility-administered medications on file prior to visit.    Review of Systems     Objective:  There were no vitals filed for this visit. BP Readings from Last 3 Encounters:  02/22/22 131/68  02/22/22 (!) 162/87  07/10/21 130/78   Wt Readings from Last 3 Encounters:  02/22/22 223 lb (101.2 kg)  07/10/21 223 lb (101.2 kg)  12/31/20 226 lb (102.5 kg)   There is no height or weight on file to calculate BMI.    Physical Exam         Assessment &  Plan:    See Problem List for Assessment and Plan of chronic medical problems.

## 2022-02-25 ENCOUNTER — Ambulatory Visit (INDEPENDENT_AMBULATORY_CARE_PROVIDER_SITE_OTHER): Payer: BC Managed Care – PPO | Admitting: Internal Medicine

## 2022-02-25 ENCOUNTER — Encounter: Payer: Self-pay | Admitting: Internal Medicine

## 2022-02-25 VITALS — BP 138/80 | HR 68 | Temp 97.8°F | Wt 220.0 lb

## 2022-02-25 DIAGNOSIS — I10 Essential (primary) hypertension: Secondary | ICD-10-CM | POA: Diagnosis not present

## 2022-02-25 DIAGNOSIS — R0789 Other chest pain: Secondary | ICD-10-CM | POA: Diagnosis not present

## 2022-02-25 DIAGNOSIS — E876 Hypokalemia: Secondary | ICD-10-CM | POA: Diagnosis not present

## 2022-02-25 DIAGNOSIS — E119 Type 2 diabetes mellitus without complications: Secondary | ICD-10-CM | POA: Diagnosis not present

## 2022-02-25 DIAGNOSIS — K219 Gastro-esophageal reflux disease without esophagitis: Secondary | ICD-10-CM

## 2022-02-25 DIAGNOSIS — E782 Mixed hyperlipidemia: Secondary | ICD-10-CM | POA: Diagnosis not present

## 2022-02-25 DIAGNOSIS — Z136 Encounter for screening for cardiovascular disorders: Secondary | ICD-10-CM

## 2022-02-25 LAB — COMPREHENSIVE METABOLIC PANEL
ALT: 33 U/L (ref 0–35)
AST: 25 U/L (ref 0–37)
Albumin: 4.4 g/dL (ref 3.5–5.2)
Alkaline Phosphatase: 75 U/L (ref 39–117)
BUN: 17 mg/dL (ref 6–23)
CO2: 29 mEq/L (ref 19–32)
Calcium: 9.9 mg/dL (ref 8.4–10.5)
Chloride: 104 mEq/L (ref 96–112)
Creatinine, Ser: 1.13 mg/dL (ref 0.40–1.20)
GFR: 50.49 mL/min — ABNORMAL LOW (ref >60.00)
Glucose, Bld: 99 mg/dL (ref 70–99)
Potassium: 3.6 mEq/L (ref 3.5–5.1)
Sodium: 141 mEq/L (ref 135–145)
Total Bilirubin: 0.8 mg/dL (ref 0.2–1.2)
Total Protein: 7.5 g/dL (ref 6.0–8.3)

## 2022-02-25 LAB — MICROALBUMIN / CREATININE URINE RATIO
Creatinine,U: 65.2 mg/dL
Microalb Creat Ratio: 1.1 mg/g (ref 0.0–30.0)
Microalb, Ur: <0.7 mg/dL (ref 0.0–1.9)

## 2022-02-25 LAB — HEMOGLOBIN A1C: Hgb A1c MFr Bld: 6.2 % (ref 4.6–6.5)

## 2022-02-25 LAB — LIPID PANEL
Cholesterol: 163 mg/dL (ref 0–200)
HDL: 59.8 mg/dL (ref >39.00)
LDL Cholesterol: 80 mg/dL (ref 0–99)
NonHDL: 102.99
Total CHOL/HDL Ratio: 3
Triglycerides: 113 mg/dL (ref 0.0–149.0)
VLDL: 22.6 mg/dL (ref 0.0–40.0)

## 2022-02-25 MED ORDER — SEMAGLUTIDE(0.25 OR 0.5MG/DOS) 2 MG/3ML ~~LOC~~ SOPN: PEN_INJECTOR | SUBCUTANEOUS | 1 refills | Status: DC

## 2022-02-25 NOTE — Assessment & Plan Note (Signed)
Recently went to the urgent care in the ED for chest pain Initial work-up there including EKG, troponin x1, chest x-ray were all normal Pain improved with PPI and was likely GERD related Given her risk factors we will go ahead and get a CT coronary calcium score

## 2022-02-25 NOTE — Assessment & Plan Note (Signed)
Chronic Secondary to hydrochlorothiazide Currently taking 10 mill equivalents of KCl-we will recheck CMP today and will likely need to increase the amount she is taking

## 2022-02-25 NOTE — Assessment & Plan Note (Signed)
Chronic Blood pressure adequately controlled CMP Continue-would like it ideally lower lisinopril-HCTZ 20-25 mg daily, diltiazem 240 mg daily

## 2022-02-25 NOTE — Assessment & Plan Note (Signed)
Chronic Recent flare of chest pain was related to reflux since it has been improved with Nexium 20 mg daily EKG, troponin and chest x-ray in the ED normal She would like to continue the Nexium 20 mg daily-stressed that if this does not completely control her heartburn to let me know so that we can add or adjust medication She is eating healthy and trying to work on weight loss

## 2022-02-25 NOTE — Assessment & Plan Note (Signed)
Chronic Regular exercise and healthy diet encouraged Check lipid panel  Continue Crestor 5 mg daily 

## 2022-02-25 NOTE — Patient Instructions (Addendum)
     Blood work was ordered.     Medications changes include :   ozempic 0.25 mg weekly   Your prescription(s) have been sent to your pharmacy.      Return in about 6 months (around 08/26/2022) for follow up.

## 2022-02-25 NOTE — Assessment & Plan Note (Signed)
Chronic Lab Results  Component Value Date   HGBA1C 6.2 02/25/2022   Sugars well controlled Continue metformin 500 mg twice daily Will start Ozempic for cardiac prevention and to help with weight loss-we will taper dose as tolerated Continue to work on improving diet, exercising regularly and weight loss

## 2022-03-31 ENCOUNTER — Ambulatory Visit (HOSPITAL_BASED_OUTPATIENT_CLINIC_OR_DEPARTMENT_OTHER)
Admission: RE | Admit: 2022-03-31 | Discharge: 2022-03-31 | Disposition: A | Discharge status: Home / Self Care | Payer: BC Managed Care – PPO | Source: Ambulatory Visit | Attending: Internal Medicine | Admitting: Internal Medicine

## 2022-03-31 DIAGNOSIS — Z136 Encounter for screening for cardiovascular disorders: Secondary | ICD-10-CM | POA: Insufficient documentation

## 2022-04-03 ENCOUNTER — Encounter: Payer: Self-pay | Admitting: Internal Medicine

## 2022-04-03 DIAGNOSIS — R931 Abnormal findings on diagnostic imaging of heart and coronary circulation: Secondary | ICD-10-CM | POA: Insufficient documentation

## 2022-04-03 DIAGNOSIS — K760 Fatty (change of) liver, not elsewhere classified: Secondary | ICD-10-CM | POA: Insufficient documentation

## 2022-04-03 DIAGNOSIS — R918 Other nonspecific abnormal finding of lung field: Secondary | ICD-10-CM | POA: Insufficient documentation

## 2022-04-12 DIAGNOSIS — H35033 Hypertensive retinopathy, bilateral: Secondary | ICD-10-CM | POA: Diagnosis not present

## 2022-04-20 ENCOUNTER — Encounter: Payer: Self-pay | Admitting: Internal Medicine

## 2022-04-22 ENCOUNTER — Other Ambulatory Visit: Payer: Self-pay | Admitting: Internal Medicine

## 2022-04-23 ENCOUNTER — Encounter: Payer: Self-pay | Admitting: Internal Medicine

## 2022-04-24 MED ORDER — SEMAGLUTIDE (1 MG/DOSE) 4 MG/3ML ~~LOC~~ SOPN: 1.0000 mg | PEN_INJECTOR | SUBCUTANEOUS | 0 refills | Status: DC

## 2022-05-13 ENCOUNTER — Other Ambulatory Visit: Payer: Self-pay | Admitting: Internal Medicine

## 2022-05-13 DIAGNOSIS — Z1231 Encounter for screening mammogram for malignant neoplasm of breast: Secondary | ICD-10-CM

## 2022-05-26 ENCOUNTER — Other Ambulatory Visit: Payer: Self-pay | Admitting: Internal Medicine

## 2022-05-31 ENCOUNTER — Other Ambulatory Visit: Payer: Self-pay | Admitting: Internal Medicine

## 2022-06-01 ENCOUNTER — Other Ambulatory Visit: Payer: Self-pay | Admitting: Internal Medicine

## 2022-06-01 ENCOUNTER — Encounter: Payer: Self-pay | Admitting: Internal Medicine

## 2022-06-02 ENCOUNTER — Other Ambulatory Visit: Payer: Self-pay

## 2022-06-02 MED ORDER — SEMAGLUTIDE (1 MG/DOSE) 4 MG/3ML ~~LOC~~ SOPN
1.0000 mg | PEN_INJECTOR | SUBCUTANEOUS | 0 refills | Status: DC
Start: 2022-06-02 — End: 2022-06-25

## 2022-06-24 ENCOUNTER — Encounter: Payer: Self-pay | Admitting: Internal Medicine

## 2022-06-25 ENCOUNTER — Other Ambulatory Visit: Payer: Self-pay | Admitting: Internal Medicine

## 2022-07-08 ENCOUNTER — Ambulatory Visit
Admission: RE | Admit: 2022-07-08 | Discharge: 2022-07-08 | Disposition: A | Discharge status: Home / Self Care | Payer: BC Managed Care – PPO | Source: Ambulatory Visit

## 2022-07-08 DIAGNOSIS — Z1231 Encounter for screening mammogram for malignant neoplasm of breast: Secondary | ICD-10-CM | POA: Diagnosis not present

## 2022-07-16 ENCOUNTER — Other Ambulatory Visit: Payer: Self-pay | Admitting: Internal Medicine

## 2022-07-19 ENCOUNTER — Other Ambulatory Visit: Payer: Self-pay

## 2022-07-19 ENCOUNTER — Encounter: Payer: Self-pay | Admitting: Internal Medicine

## 2022-07-19 DIAGNOSIS — R102 Pelvic and perineal pain: Secondary | ICD-10-CM | POA: Diagnosis not present

## 2022-07-19 DIAGNOSIS — N898 Other specified noninflammatory disorders of vagina: Secondary | ICD-10-CM | POA: Diagnosis not present

## 2022-07-19 DIAGNOSIS — N760 Acute vaginitis: Secondary | ICD-10-CM | POA: Diagnosis not present

## 2022-07-19 DIAGNOSIS — E119 Type 2 diabetes mellitus without complications: Secondary | ICD-10-CM

## 2022-07-19 MED ORDER — SEMAGLUTIDE (2 MG/DOSE) 8 MG/3ML ~~LOC~~ SOPN: 2.0000 mg | PEN_INJECTOR | SUBCUTANEOUS | 0 refills | Status: DC

## 2022-07-23 ENCOUNTER — Ambulatory Visit
Admission: EM | Admit: 2022-07-23 | Discharge: 2022-07-23 | Disposition: A | Discharge status: Home / Self Care | Payer: BC Managed Care – PPO

## 2022-07-23 ENCOUNTER — Other Ambulatory Visit: Payer: Self-pay | Admitting: Internal Medicine

## 2022-07-23 ENCOUNTER — Other Ambulatory Visit: Payer: Self-pay

## 2022-07-23 DIAGNOSIS — R21 Rash and other nonspecific skin eruption: Secondary | ICD-10-CM | POA: Diagnosis not present

## 2022-07-23 MED ORDER — CETIRIZINE HCL 5 MG PO TABS: 5.0000 mg | ORAL_TABLET | Freq: Every day | ORAL | 0 refills | Status: DC

## 2022-07-23 NOTE — ED Triage Notes (Signed)
Pt states that she has a rash on her right arm. X3 days

## 2022-07-23 NOTE — ED Provider Notes (Signed)
[facility]    CSN: [id number] Arrival date & time: 07/23/22  1314      History   Chief Complaint Chief Complaint  Patient presents with   Rash    Rash on right arm x3 days    HPI Tami Hughes is a 67 y.o. female.   Patient presents with itchy rash to right arm that has been present for about 3 days.  Patient is concerned that something may have bitten or stung her given that she felt a stinging sensation when she put one of her jackets on recently.  Patient has 3 lesions to the right upper arm that have grown in size.  She reports that she has applied topical steroid cream with minimal improvement in symptoms.  Denies any changes to lotions, soaps, detergents, foods, etc.  Denies any associated fever.   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   Diabetes ([place]) 11/11/2016   Herpes simplex without mention of complication 93/73/4287   Morbid obesity ([place]) 05/02/2012   Personal history of colonic polyps 01/27/2012   VENOUS INSUFFICIENCY, LEGS 08/28/2010   BRONCHITIS, ACUTE 08/22/2008   Hyperlipidemia 02/22/2008   Essential hypertension 02/22/2008   GERD 02/22/2008    Past Surgical History:  Procedure Laterality Date   ABDOMINAL HYSTERECTOMY     TAH   CHOLECYSTECTOMY N/A 12/18/2014   Procedure: LAPAROSCOPIC CHOLECYSTECTOMY WITH INTRAOPERATIVE CHOLANGIOGRAM;  Surgeon: Johnathan Hausen, MD;  Location: [facility];  Service: General;  Laterality: N/A;   COLONOSCOPY      LABIOPLASTY     POLYPECTOMY     PUBOVAGINAL SLING  05/08/2010   stress urinary incontinence   VULVECTOMY     partial vulvectomy secondary to labial hypertrophy..    OB History     Gravida  2   Para  2   Term  2   Preterm      AB      Living  2      SAB      IAB      Ectopic      Multiple      Live Births  2            Home Medications    Prior to Admission medications   Medication Sig Start Date End Date Taking? Authorizing Provider  cetirizine (ZYRTEC) 5 MG tablet Take 1 tablet (5 mg total) by mouth daily. 07/23/22  Yes Hunner Garcon, Michele Rockers, FNP  diltiazem (CARDIZEM CD) 240 MG 24 hr capsule Take 1 capsule by mouth once daily 12/15/21  Yes Burns, Claudina Lick, MD  lisinopril-hydrochlorothiazide (ZESTORETIC) 20-25 MG tablet Take 1 tablet by mouth once daily 07/23/22  Yes Burns, Claudina Lick, MD  metFORMIN (GLUCOPHAGE) 500 MG tablet Take 1 tablet (500 mg total) by mouth 2 (two) times daily with a meal. 08/05/21  Yes Burns, Claudina Lick, MD  metroNIDAZOLE (FLAGYL) 500 MG tablet SMARTSIG:1  Tablet(s) By Mouth Every 12 Hours 07/19/22  Yes [provider]  OZEMPIC, 1 MG/DOSE, 4 MG/3ML SOPN INJECT 1 MG SUBCUTANEOUSLY ONCE A WEEK AS DIRECTED 06/25/22  Yes Burns, Claudina Lick, MD  pantoprazole (PROTONIX) 40 MG tablet Take 1 tablet (40 mg total) by mouth daily. Take 30 minutes prior to a meal. 10/30/20  Yes Burns, Claudina Lick, MD  potassium chloride (KLOR-CON M) 10 MEQ tablet Take 1 tablet by mouth twice daily 12/31/21  Yes Burns, Claudina Lick, MD  rosuvastatin (CRESTOR) 5 MG tablet Take 1 tablet by mouth once daily 07/16/22  Yes Burns, Claudina Lick, MD  Semaglutide, 2 MG/DOSE, 8 MG/3ML SOPN Inject 2 mg as directed once a week. 07/19/22  Yes Burns, Claudina Lick, MD  triamcinolone cream (KENALOG) 0.1 % Apply 1 application topically 2 (two) times daily. 02/02/19  Yes Burns, Claudina Lick, MD    Family History Family History  Problem Relation Age of Onset   Kidney disease Mother        had transplant   Breast cancer Mother     Heart disease Sister    Heart attack Sister    Colon cancer Neg Hx    Esophageal cancer Neg Hx    Rectal cancer Neg Hx    Stomach cancer Neg Hx    Diabetes Neg Hx    Hyperlipidemia Neg Hx    COPD Neg Hx    Colon polyps Neg Hx     Social History Social History   Tobacco Use   Smoking status: Never   Smokeless tobacco: Never  Vaping Use   Vaping Use: Never used  Substance Use Topics   Alcohol use: Yes    Alcohol/week: 2.0 standard drinks of alcohol    Types: 2 Standard drinks or equivalent per week    Comment: wine on weekends   Drug use: No     Allergies   Patient has no known allergies.   Review of Systems Review of Systems Per HPI  Physical Exam Triage Vital Signs ED Triage Vitals  Enc Vitals Group     BP 07/23/22 1521 (!) 149/76     Pulse Rate 07/23/22 1521 76     Resp 07/23/22 1521 18     Temp 07/23/22 1521 98.2 F (36.8 C)     Temp Source 07/23/22 1521 Oral     SpO2 07/23/22 1521 98 %     Weight 07/23/22 1520 201 lb (91.2 kg)     Height 07/23/22 1520 '5\' 3"'$  (1.6 m)     Head Circumference --      Peak Flow --      Pain Score 07/23/22 1520 0     Pain Loc --      Pain Edu? --      Excl. in [place]? --    No data found.  Updated Vital Signs BP (!) 149/76 (BP Location: Right Arm)   Pulse 76   Temp 98.2 F (36.8 C) (Oral)   Resp 18   Ht '5\' 3"'$  (1.6 m)   Wt 201 lb (91.2 kg)   SpO2 98%   BMI 35.61 kg/m   Visual Acuity Right Eye Distance:   Left Eye Distance:   Bilateral Distance:    Right Eye Near:   Left Eye Near:    Bilateral Near:     Physical Exam Constitutional:      General: She is not in acute distress.    Appearance: Normal appearance. She is not toxic-appearing or diaphoretic.  HENT:     Head: Normocephalic and atraumatic.  Eyes:     Extraocular Movements: Extraocular movements intact.     Conjunctiva/sclera: Conjunctivae normal.  Pulmonary:     Effort: Pulmonary effort is normal.  Skin:    Comments: Patient has 3 lesions to  right upper arm.  Two of them are present to lateral right upper arm and are approximately 2 inches in diameter.  Slightly raised.  No area of fluctuance.  Similar lesion that is smaller directly adjacent to posterior upper arm that is approximately 0.75 to 1 inch in diameter.  No area of fluctuance of this either.  All 3 of them are slightly erythematous.  Neurological:     General: No focal deficit present.     Mental Status: She is alert and oriented to person, place, and time. Mental status is at baseline.  Psychiatric:        Mood and Affect: Mood normal.        Behavior: Behavior normal.        Thought Content: Thought content normal.        Judgment: Judgment normal.      UC Treatments / Results  Labs (all labs ordered are listed, but only abnormal results are displayed) Labs Reviewed - No data to display  EKG   Radiology No results found.  Procedures Procedures (including critical care time)  Medications Ordered in UC Medications - No data to display  Initial Impression / Assessment and Plan / UC Course  I have reviewed the triage vital signs and the nursing notes.  Pertinent labs & imaging results that were available during my care of the patient were reviewed by me and considered in my medical decision making (see chart for details).     Rash is consistent with possible insect versus spider bite.  There is no sign of bacterial, viral, fungal infection on exam.  Will treat with antihistamine as patient states that she does not take a daily antihistamine.  Will prescribe low-dose given patient's age.  Patient denies that she takes potassium anymore so this should be safe.  Advised cool compresses as well.  Circled lesions with skin pen in urgent care and patient sent home with this to monitor size.  She was encouraged to follow-up if any symptoms persist or worsen.  Patient verbalized understanding and was agreeable with plan. Final Clinical Impressions(s) / UC Diagnoses    Final diagnoses:  Rash and nonspecific skin eruption     Discharge Instructions      Suspect that something has bitten you.  I have prescribed an antihistamine for you to take to help decrease this reaction.  Also recommend cool compresses which is a cool washcloth or ice application.  Please monitor it closely and follow-up if any symptoms persist or worsen.    ED Prescriptions     Medication Sig Dispense Auth. Provider   cetirizine (ZYRTEC) 5 MG tablet Take 1 tablet (5 mg total) by mouth daily. 30 tablet [place], Michele Rockers, [place]      PDMP not reviewed this encounter.   Teodora Medici, [place] 07/23/22 [phone]

## 2022-07-23 NOTE — Discharge Instructions (Signed)
Suspect that something has bitten you.  I have prescribed an antihistamine for you to take to help decrease this reaction.  Also recommend cool compresses which is a cool washcloth or ice application.  Please monitor it closely and follow-up if any symptoms persist or worsen.

## 2022-08-10 ENCOUNTER — Other Ambulatory Visit: Payer: Self-pay | Admitting: Internal Medicine

## 2022-08-10 DIAGNOSIS — E119 Type 2 diabetes mellitus without complications: Secondary | ICD-10-CM

## 2022-09-01 DIAGNOSIS — L309 Dermatitis, unspecified: Secondary | ICD-10-CM | POA: Diagnosis not present

## 2022-09-15 ENCOUNTER — Other Ambulatory Visit: Payer: Self-pay | Admitting: Internal Medicine

## 2022-09-16 ENCOUNTER — Other Ambulatory Visit: Payer: Self-pay | Admitting: Urology

## 2022-09-16 DIAGNOSIS — N281 Cyst of kidney, acquired: Secondary | ICD-10-CM

## 2022-09-17 ENCOUNTER — Other Ambulatory Visit: Payer: Self-pay | Admitting: Internal Medicine

## 2022-09-21 ENCOUNTER — Other Ambulatory Visit: Payer: Self-pay | Admitting: Internal Medicine

## 2022-09-27 ENCOUNTER — Ambulatory Visit: Payer: BC Managed Care – PPO

## 2022-10-10 ENCOUNTER — Other Ambulatory Visit: Payer: Self-pay | Admitting: Internal Medicine

## 2022-10-10 DIAGNOSIS — E119 Type 2 diabetes mellitus without complications: Secondary | ICD-10-CM

## 2022-10-11 ENCOUNTER — Other Ambulatory Visit: Payer: Self-pay

## 2022-10-14 ENCOUNTER — Other Ambulatory Visit: Payer: Self-pay

## 2022-10-14 ENCOUNTER — Other Ambulatory Visit: Payer: Self-pay | Admitting: Internal Medicine

## 2022-11-09 ENCOUNTER — Encounter: Payer: Self-pay | Admitting: Internal Medicine

## 2022-11-09 ENCOUNTER — Other Ambulatory Visit: Payer: Self-pay | Admitting: Internal Medicine

## 2022-11-09 DIAGNOSIS — E119 Type 2 diabetes mellitus without complications: Secondary | ICD-10-CM

## 2022-11-11 ENCOUNTER — Ambulatory Visit
Admission: RE | Admit: 2022-11-11 | Discharge: 2022-11-11 | Disposition: A | Discharge status: Home / Self Care | Payer: BC Managed Care – PPO | Source: Ambulatory Visit | Attending: Urology | Admitting: Urology

## 2022-11-11 DIAGNOSIS — N281 Cyst of kidney, acquired: Secondary | ICD-10-CM

## 2022-11-11 DIAGNOSIS — N2889 Other specified disorders of kidney and ureter: Secondary | ICD-10-CM | POA: Diagnosis not present

## 2022-11-11 MED ORDER — GADOPICLENOL 0.5 MMOL/ML IV SOLN
8.0000 mL | Freq: Once | INTRAVENOUS | Status: AC | PRN
  Administered 2022-11-11: 8 mL via INTRAVENOUS

## 2022-11-17 ENCOUNTER — Encounter: Payer: Self-pay | Admitting: Internal Medicine

## 2022-11-17 ENCOUNTER — Other Ambulatory Visit: Payer: Self-pay | Admitting: Internal Medicine

## 2022-11-17 DIAGNOSIS — N183 Chronic kidney disease, stage 3 unspecified: Secondary | ICD-10-CM | POA: Insufficient documentation

## 2022-11-17 DIAGNOSIS — N1831 Chronic kidney disease, stage 3a: Secondary | ICD-10-CM | POA: Insufficient documentation

## 2022-11-17 NOTE — Progress Notes (Unsigned)
Subjective:    Patient ID: Tami Hughes, female    DOB: [birth year], 67 y.o.   MRN: [id number]      HPI Navil is here for a Physical exam and her chronic medical problems.    Weight loss has platued and she wonders about changing meds.'  Just graduated and got PhD!  Medications and allergies reviewed with patient and updated if appropriate.  Current Outpatient Medications on File Prior to Visit  Medication Sig Dispense Refill   cetirizine (ZYRTEC) 5 MG tablet Take 1 tablet (5 mg total) by mouth daily. 30 tablet 0   diltiazem (CARDIZEM CD) 240 MG 24 hr capsule Take 1 capsule by mouth once daily 90 capsule 0   lisinopril-hydrochlorothiazide (ZESTORETIC) 20-25 MG tablet Take 1 tablet by mouth once daily 90 tablet 1   metFORMIN (GLUCOPHAGE) 500 MG tablet TAKE 1 TABLET BY MOUTH TWICE DAILY WITH A MEAL 180 tablet 0   OZEMPIC, 2 MG/DOSE, 8 MG/3ML SOPN INJECT 2 MG INTO THE SKIN ONCE A WEEK AS DIRECTED 3 mL 0   pantoprazole (PROTONIX) 40 MG tablet Take 1 tablet (40 mg total) by mouth daily. Take 30 minutes prior to a meal. 90 tablet 3   potassium chloride (KLOR-CON M) 10 MEQ tablet Take 1 tablet by mouth twice daily 180 tablet 0   rosuvastatin (CRESTOR) 5 MG tablet Take 1 tablet by mouth once daily 90 tablet 1   triamcinolone cream (KENALOG) 0.1 % Apply 1 application topically 2 (two) times daily. 30 g 0   No current facility-administered medications on file prior to visit.    Review of Systems  Constitutional:  Negative for fever.  Eyes:  Negative for visual disturbance.  Respiratory:  Negative for cough, shortness of breath and wheezing.   Cardiovascular:  Negative for chest pain, palpitations and leg swelling.  Gastrointestinal:  Positive for constipation. Negative for abdominal pain, blood in stool and diarrhea.        gerd  Genitourinary:  Negative for dysuria.  Musculoskeletal:  Negative for arthralgias and back pain.  Skin:  Negative for rash.  Neurological:  Negative  for light-headedness and headaches.  Psychiatric/Behavioral:  Negative for dysphoric mood. The patient is not nervous/anxious.        Objective:   Vitals:   11/18/22 0837  BP: 120/60  Pulse: 82  Temp: 98.1 F (36.7 C)  SpO2: 99%   Filed Weights   11/18/22 0837  Weight: 204 lb (92.5 kg)   Body mass index is 36.14 kg/m.  BP Readings from Last 3 Encounters:  11/18/22 120/60  07/23/22 (!) 149/76  02/25/22 138/80    Wt Readings from Last 3 Encounters:  11/18/22 204 lb (92.5 kg)  07/23/22 201 lb (91.2 kg)  02/25/22 220 lb (99.8 kg)       Physical Exam Constitutional: She appears well-developed and well-nourished. No distress.  HENT:  Head: Normocephalic and atraumatic.  Right Ear: External ear normal. Normal ear canal and TM Left Ear: External ear normal.  Normal ear canal and TM Mouth/Throat: Oropharynx is clear and moist.  Eyes: Conjunctivae normal.  Neck: Neck supple. No tracheal deviation present. No thyromegaly present.  No carotid bruit  Cardiovascular: Normal rate, regular rhythm and normal heart sounds.   No murmur heard.  No edema. Pulmonary/Chest: Effort normal and breath sounds normal. No respiratory distress. She has no wheezes. She has no rales.  Breast: deferred   Abdominal: Soft. She exhibits no distension. There is no tenderness.  Lymphadenopathy:  She has no cervical adenopathy.  Skin: Skin is warm and dry. She is not diaphoretic.  Psychiatric: She has a normal mood and affect. Her behavior is normal.    Diabetic Foot Exam - Simple   Simple Foot Form Diabetic Foot exam was performed with the following findings: Yes 11/18/2022  9:14 AM  Visual Inspection No deformities, no ulcerations, no other skin breakdown bilaterally: Yes Sensation Testing Intact to touch and monofilament testing bilaterally: Yes Pulse Check Posterior Tibialis and Dorsalis pulse intact bilaterally: Yes Comments       Lab Results  Component Value Date   WBC 6.6  02/22/2022   HGB 13.5 02/22/2022   HCT 39.8 02/22/2022   PLT 204 02/22/2022   GLUCOSE 99 02/25/2022   CHOL 163 02/25/2022   TRIG 113.0 02/25/2022   HDL 59.80 02/25/2022   LDLDIRECT 179.1 05/02/2012   LDLCALC 80 02/25/2022   ALT 33 02/25/2022   AST 25 02/25/2022   NA 141 02/25/2022   K 3.6 02/25/2022   CL 104 02/25/2022   CREATININE 1.13 02/25/2022   BUN 17 02/25/2022   CO2 29 02/25/2022   TSH 2.05 07/10/2021   HGBA1C 6.2 02/25/2022   MICROALBUR <0.7 02/25/2022         Assessment & Plan:   Physical exam: Screening blood work  ordered Exercise  regular -at least 30 min a day Weight has lost weight-continues to work on weight loss Substance abuse  none   Reviewed recommended immunizations.   Health Maintenance  Topic Date Due   FOOT EXAM  Never done   OPHTHALMOLOGY EXAM  Never done   Zoster Vaccines- Shingrix (1 of 2) Never done   DEXA SCAN  05/18/2017   Pneumonia Vaccine 40+ Years old (2 of 2 - PCV) 01/23/2020   COVID-19 Vaccine (5 - 2023-24 season) 02/26/2022   DTaP/Tdap/Td (2 - Td or Tdap) 05/02/2022   HEMOGLOBIN A1C  08/26/2022   INFLUENZA VACCINE  01/27/2023   Diabetic kidney evaluation - eGFR measurement  02/26/2023   Diabetic kidney evaluation - Urine ACR  02/26/2023   MAMMOGRAM  07/08/2024   COLONOSCOPY (Pts 45-8yrs Insurance coverage will need to be confirmed)  11/27/2025   Hepatitis C Screening  Completed   HPV VACCINES  Aged Out          See Problem List for Assessment and Plan of chronic medical problems.

## 2022-11-17 NOTE — Patient Instructions (Addendum)
Blood work was ordered.   The lab is on the first floor.    Medications changes include :   mounajro 2.5 mg  weekly      Return in about 6 months (around 05/21/2023) for follow up.   Health Maintenance, Female Adopting a healthy lifestyle and getting preventive care are important in promoting health and wellness. Ask your health care provider about: The right schedule for you to have regular tests and exams. Things you can do on your own to prevent diseases and keep yourself healthy. What should I know about diet, weight, and exercise? Eat a healthy diet  Eat a diet that includes plenty of vegetables, fruits, low-fat dairy products, and lean protein. Do not eat a lot of foods that are high in solid fats, added sugars, or sodium. Maintain a healthy weight Body mass index (BMI) is used to identify weight problems. It estimates body fat based on height and weight. Your health care provider can help determine your BMI and help you achieve or maintain a healthy weight. Get regular exercise Get regular exercise. This is one of the most important things you can do for your health. Most adults should: Exercise for at least 150 minutes each week. The exercise should increase your heart rate and make you sweat (moderate-intensity exercise). Do strengthening exercises at least twice a week. This is in addition to the moderate-intensity exercise. Spend less time sitting. Even light physical activity can be beneficial. Watch cholesterol and blood lipids Have your blood tested for lipids and cholesterol at 68 years of age, then have this test every 5 years. Have your cholesterol levels checked more often if: Your lipid or cholesterol levels are high. You are older than 68 years of age. You are at high risk for heart disease. What should I know about cancer screening? Depending on your health history and family history, you may need to have cancer screening at various ages. This may  include screening for: Breast cancer. Cervical cancer. Colorectal cancer. Skin cancer. Lung cancer. What should I know about heart disease, diabetes, and high blood pressure? Blood pressure and heart disease High blood pressure causes heart disease and increases the risk of stroke. This is more likely to develop in people who have high blood pressure readings or are overweight. Have your blood pressure checked: Every 3-5 years if you are 52-55 years of age. Every year if you are 65 years old or older. Diabetes Have regular diabetes screenings. This checks your fasting blood sugar level. Have the screening done: Once every three years after age 70 if you are at a normal weight and have a low risk for diabetes. More often and at a younger age if you are overweight or have a high risk for diabetes. What should I know about preventing infection? Hepatitis B If you have a higher risk for hepatitis B, you should be screened for this virus. Talk with your health care provider to find out if you are at risk for hepatitis B infection. Hepatitis C Testing is recommended for: Everyone born from [birth year] through 1966. Anyone with known risk factors for hepatitis C. Sexually transmitted infections (STIs) Get screened for STIs, including gonorrhea and chlamydia, if: You are sexually active and are younger than 68 years of age. You are older than 67 years of age and your health care provider tells you that you are at risk for this type of infection. Your sexual activity has changed since you were last  screened, and you are at increased risk for chlamydia or gonorrhea. Ask your health care provider if you are at risk. Ask your health care provider about whether you are at high risk for HIV. Your health care provider may recommend a prescription medicine to help prevent HIV infection. If you choose to take medicine to prevent HIV, you should first get tested for HIV. You should then be tested every 3 months  for as long as you are taking the medicine. Pregnancy If you are about to stop having your period (premenopausal) and you may become pregnant, seek counseling before you get pregnant. Take 400 to 800 micrograms (mcg) of folic acid every day if you become pregnant. Ask for birth control (contraception) if you want to prevent pregnancy. Osteoporosis and menopause Osteoporosis is a disease in which the bones lose minerals and strength with aging. This can result in bone fractures. If you are 78 years old or older, or if you are at risk for osteoporosis and fractures, ask your health care provider if you should: Be screened for bone loss. Take a calcium or vitamin D supplement to lower your risk of fractures. Be given hormone replacement therapy (HRT) to treat symptoms of menopause. Follow these instructions at home: Alcohol use Do not drink alcohol if: Your health care provider tells you not to drink. You are pregnant, may be pregnant, or are planning to become pregnant. If you drink alcohol: Limit how much you have to: 0-1 drink a day. Know how much alcohol is in your drink. In the [place] one drink equals one 12 oz bottle of beer (355 mL), one 5 oz glass of wine (148 mL), or one 1 oz glass of hard liquor (44 mL). Lifestyle Do not use any products that contain nicotine or tobacco. These products include cigarettes, chewing tobacco, and vaping devices, such as e-cigarettes. If you need help quitting, ask your health care provider. Do not use street drugs. Do not share needles. Ask your health care provider for help if you need support or information about quitting drugs. General instructions Schedule regular health, dental, and eye exams. Stay current with your vaccines. Tell your health care provider if: You often feel depressed. You have ever been abused or do not feel safe at home. Summary Adopting a healthy lifestyle and getting preventive care are important in promoting health and  wellness. Follow your health care provider's instructions about healthy diet, exercising, and getting tested or screened for diseases. Follow your health care provider's instructions on monitoring your cholesterol and blood pressure. This information is not intended to replace advice given to you by your health care provider. Make sure you discuss any questions you have with your health care provider. Document Revised: 11/03/2020 Document Reviewed: 11/03/2020 Elsevier Patient Education  2023 [employer].

## 2022-11-18 ENCOUNTER — Ambulatory Visit (INDEPENDENT_AMBULATORY_CARE_PROVIDER_SITE_OTHER): Payer: BC Managed Care – PPO | Admitting: Internal Medicine

## 2022-11-18 VITALS — BP 120/60 | HR 82 | Temp 98.1°F | Ht 63.0 in | Wt 204.0 lb

## 2022-11-18 DIAGNOSIS — Z7984 Long term (current) use of oral hypoglycemic drugs: Secondary | ICD-10-CM | POA: Diagnosis not present

## 2022-11-18 DIAGNOSIS — E782 Mixed hyperlipidemia: Secondary | ICD-10-CM | POA: Diagnosis not present

## 2022-11-18 DIAGNOSIS — N1831 Chronic kidney disease, stage 3a: Secondary | ICD-10-CM

## 2022-11-18 DIAGNOSIS — K219 Gastro-esophageal reflux disease without esophagitis: Secondary | ICD-10-CM

## 2022-11-18 DIAGNOSIS — E2839 Other primary ovarian failure: Secondary | ICD-10-CM

## 2022-11-18 DIAGNOSIS — Z13820 Encounter for screening for osteoporosis: Secondary | ICD-10-CM

## 2022-11-18 DIAGNOSIS — E119 Type 2 diabetes mellitus without complications: Secondary | ICD-10-CM | POA: Diagnosis not present

## 2022-11-18 DIAGNOSIS — R931 Abnormal findings on diagnostic imaging of heart and coronary circulation: Secondary | ICD-10-CM

## 2022-11-18 DIAGNOSIS — Z0000 Encounter for general adult medical examination without abnormal findings: Secondary | ICD-10-CM

## 2022-11-18 DIAGNOSIS — Z7985 Long-term (current) use of injectable non-insulin antidiabetic drugs: Secondary | ICD-10-CM | POA: Diagnosis not present

## 2022-11-18 DIAGNOSIS — I10 Essential (primary) hypertension: Secondary | ICD-10-CM | POA: Diagnosis not present

## 2022-11-18 DIAGNOSIS — E876 Hypokalemia: Secondary | ICD-10-CM

## 2022-11-18 LAB — LIPID PANEL
Cholesterol: 156 mg/dL (ref 0–200)
HDL: 61.5 mg/dL (ref >39.00)
LDL Cholesterol: 78 mg/dL (ref 0–99)
NonHDL: 94.62
Total CHOL/HDL Ratio: 3
Triglycerides: 83 mg/dL (ref 0.0–149.0)
VLDL: 16.6 mg/dL (ref 0.0–40.0)

## 2022-11-18 LAB — COMPREHENSIVE METABOLIC PANEL
ALT: 25 U/L (ref 0–35)
AST: 23 U/L (ref 0–37)
Albumin: 4.1 g/dL (ref 3.5–5.2)
Alkaline Phosphatase: 64 U/L (ref 39–117)
BUN: 11 mg/dL (ref 6–23)
CO2: 31 mEq/L (ref 19–32)
Calcium: 10.1 mg/dL (ref 8.4–10.5)
Chloride: 105 mEq/L (ref 96–112)
Creatinine, Ser: 1.08 mg/dL (ref 0.40–1.20)
GFR: 53.04 mL/min — ABNORMAL LOW (ref >60.00)
Glucose, Bld: 92 mg/dL (ref 70–99)
Potassium: 4.3 mEq/L (ref 3.5–5.1)
Sodium: 143 mEq/L (ref 135–145)
Total Bilirubin: 0.6 mg/dL (ref 0.2–1.2)
Total Protein: 7 g/dL (ref 6.0–8.3)

## 2022-11-18 LAB — CBC WITH DIFFERENTIAL/PLATELET
Basophils Absolute: 0 10*3/uL (ref 0.0–0.1)
Basophils Relative: 0.4 % (ref 0.0–3.0)
Eosinophils Absolute: 0.1 10*3/uL (ref 0.0–0.7)
Eosinophils Relative: 2.1 % (ref 0.0–5.0)
HCT: 39.2 % (ref 36.0–46.0)
Hemoglobin: 12.7 g/dL (ref 12.0–15.0)
Lymphocytes Relative: 47.3 % — ABNORMAL HIGH (ref 12.0–46.0)
Lymphs Abs: 2.5 10*3/uL (ref 0.7–4.0)
MCHC: 32.5 g/dL (ref 30.0–36.0)
MCV: 94.3 fl (ref 78.0–100.0)
Monocytes Absolute: 0.4 10*3/uL (ref 0.1–1.0)
Monocytes Relative: 8.1 % (ref 3.0–12.0)
Neutro Abs: 2.2 10*3/uL (ref 1.4–7.7)
Neutrophils Relative %: 42.1 % — ABNORMAL LOW (ref 43.0–77.0)
Platelets: 213 10*3/uL (ref 150.0–400.0)
RBC: 4.16 Mil/uL (ref 3.87–5.11)
RDW: 13.9 % (ref 11.5–15.5)
WBC: 5.2 10*3/uL (ref 4.0–10.5)

## 2022-11-18 LAB — URINALYSIS, ROUTINE W REFLEX MICROSCOPIC
Bilirubin Urine: NEGATIVE
Hgb urine dipstick: NEGATIVE
Ketones, ur: NEGATIVE
Nitrite: NEGATIVE
Specific Gravity, Urine: 1.015 (ref 1.000–1.030)
Total Protein, Urine: NEGATIVE
Urine Glucose: NEGATIVE
Urobilinogen, UA: 0.2 (ref 0.0–1.0)
pH: 6 (ref 5.0–8.0)

## 2022-11-18 LAB — TSH: TSH: 1.38 u[IU]/mL (ref 0.35–5.50)

## 2022-11-18 LAB — MICROALBUMIN / CREATININE URINE RATIO
Creatinine,U: 96.4 mg/dL
Microalb Creat Ratio: 1.1 mg/g (ref 0.0–30.0)
Microalb, Ur: 1 mg/dL (ref 0.0–1.9)

## 2022-11-18 LAB — HEMOGLOBIN A1C: Hgb A1c MFr Bld: 5.5 % (ref 4.6–6.5)

## 2022-11-18 MED ORDER — TIRZEPATIDE 2.5 MG/0.5ML ~~LOC~~ SOAJ: 2.5000 mg | SUBCUTANEOUS | 0 refills | Status: DC

## 2022-11-18 NOTE — Assessment & Plan Note (Signed)
Chronic Has had several recent blood work with decreased GFR-mild Discussed with her Stressed good blood pressure, sugar control Avoid NSAIDs Increase water intake Work on weight loss Low-sodium diet CBC, CMP

## 2022-11-18 NOTE — Assessment & Plan Note (Addendum)
Chronic Controlled No longer taking PPI Taking ginger beer She is eating healthy and trying to work on weight loss

## 2022-11-18 NOTE — Assessment & Plan Note (Signed)
Chronic Regular exercise and healthy diet encouraged Check lipid panel  Continue Crestor 5 mg daily 

## 2022-11-18 NOTE — Assessment & Plan Note (Signed)
Chronic Blood pressure adequately controlled CMP Continue  lisinopril-HCTZ 20-25 mg daily, diltiazem 240 mg daily

## 2022-11-18 NOTE — Assessment & Plan Note (Addendum)
Chronic Morbidly obese with BMI of 39.5 and hypertension, hyperlipidemia and diabetes She is exercising regularly She is trying to eat well and watch her portions Ozempic 2 mg weekly - not helping as much - discussed this is to be expected -- will see if mounjaro is covered

## 2022-11-18 NOTE — Assessment & Plan Note (Addendum)
Chronic Lab Results  Component Value Date   HGBA1C 6.2 02/25/2022   Sugars well controlled Continue metformin 500 mg twice daily Ozempic helping but not working as well and having constipation- would like to try mounjaro if covered - send in 2.5 mg mounjaro weekly Continue to work on improving diet, exercising regularly and weight loss

## 2022-11-18 NOTE — Assessment & Plan Note (Signed)
Chronic Mild CAD Aspirin 81 mg daily, Crestor 5 mg daily Advised weight loss Encouraged healthy diet and regular exercise

## 2022-11-18 NOTE — Assessment & Plan Note (Signed)
Chronic Secondary to hydrochlorothiazide Continue KCl 10 mEq twice daily

## 2022-11-29 DIAGNOSIS — B3731 Acute candidiasis of vulva and vagina: Secondary | ICD-10-CM | POA: Diagnosis not present

## 2022-12-02 DIAGNOSIS — N281 Cyst of kidney, acquired: Secondary | ICD-10-CM | POA: Diagnosis not present

## 2022-12-03 ENCOUNTER — Other Ambulatory Visit: Payer: Self-pay | Admitting: Urology

## 2022-12-03 DIAGNOSIS — N281 Cyst of kidney, acquired: Secondary | ICD-10-CM

## 2022-12-06 ENCOUNTER — Other Ambulatory Visit: Payer: Self-pay

## 2022-12-06 ENCOUNTER — Encounter: Payer: Self-pay | Admitting: Internal Medicine

## 2022-12-06 MED ORDER — TIRZEPATIDE 5 MG/0.5ML ~~LOC~~ SOAJ: 5.0000 mg | SUBCUTANEOUS | 0 refills | Status: DC

## 2022-12-29 ENCOUNTER — Other Ambulatory Visit: Payer: Self-pay

## 2022-12-29 ENCOUNTER — Encounter: Payer: Self-pay | Admitting: Internal Medicine

## 2022-12-29 DIAGNOSIS — E119 Type 2 diabetes mellitus without complications: Secondary | ICD-10-CM

## 2022-12-29 MED ORDER — TIRZEPATIDE 7.5 MG/0.5ML ~~LOC~~ SOAJ
7.5000 mg | SUBCUTANEOUS | 0 refills | Status: DC
Start: 2022-12-29 — End: 2023-02-17

## 2023-01-10 ENCOUNTER — Other Ambulatory Visit: Payer: Self-pay | Admitting: Internal Medicine

## 2023-02-16 ENCOUNTER — Encounter: Payer: Self-pay | Admitting: Internal Medicine

## 2023-02-17 MED ORDER — TIRZEPATIDE 10 MG/0.5ML ~~LOC~~ SOAJ: 10.0000 mg | SUBCUTANEOUS | 0 refills | Status: DC

## 2023-02-24 DIAGNOSIS — Z118 Encounter for screening for other infectious and parasitic diseases: Secondary | ICD-10-CM | POA: Diagnosis not present

## 2023-02-24 DIAGNOSIS — A5901 Trichomonal vulvovaginitis: Secondary | ICD-10-CM | POA: Diagnosis not present

## 2023-02-24 DIAGNOSIS — N898 Other specified noninflammatory disorders of vagina: Secondary | ICD-10-CM | POA: Diagnosis not present

## 2023-03-14 DIAGNOSIS — B3731 Acute candidiasis of vulva and vagina: Secondary | ICD-10-CM | POA: Diagnosis not present

## 2023-03-16 ENCOUNTER — Other Ambulatory Visit: Payer: Self-pay | Admitting: Internal Medicine

## 2023-04-12 ENCOUNTER — Other Ambulatory Visit: Payer: Self-pay | Admitting: Internal Medicine

## 2023-04-18 DIAGNOSIS — H35033 Hypertensive retinopathy, bilateral: Secondary | ICD-10-CM | POA: Diagnosis not present

## 2023-04-18 LAB — HM DIABETES EYE EXAM

## 2023-05-10 ENCOUNTER — Other Ambulatory Visit: Payer: Self-pay | Admitting: Internal Medicine

## 2023-05-12 DIAGNOSIS — A5901 Trichomonal vulvovaginitis: Secondary | ICD-10-CM | POA: Diagnosis not present

## 2023-06-02 ENCOUNTER — Other Ambulatory Visit: Payer: Self-pay | Admitting: Internal Medicine

## 2023-06-13 ENCOUNTER — Other Ambulatory Visit: Payer: Self-pay | Admitting: Internal Medicine

## 2023-06-16 ENCOUNTER — Other Ambulatory Visit: Payer: Self-pay | Admitting: Internal Medicine

## 2023-06-16 ENCOUNTER — Encounter: Payer: Self-pay | Admitting: Internal Medicine

## 2023-06-16 DIAGNOSIS — Z1231 Encounter for screening mammogram for malignant neoplasm of breast: Secondary | ICD-10-CM

## 2023-06-20 ENCOUNTER — Encounter: Payer: Self-pay | Admitting: Internal Medicine

## 2023-06-20 ENCOUNTER — Other Ambulatory Visit: Payer: Self-pay | Admitting: Internal Medicine

## 2023-06-20 DIAGNOSIS — E2839 Other primary ovarian failure: Secondary | ICD-10-CM

## 2023-06-20 NOTE — Telephone Encounter (Signed)
 Care team updated and letter sent for eye exam notes.

## 2023-06-28 ENCOUNTER — Other Ambulatory Visit: Payer: Self-pay | Admitting: Internal Medicine

## 2023-07-03 ENCOUNTER — Other Ambulatory Visit: Payer: Self-pay | Admitting: Internal Medicine

## 2023-07-12 ENCOUNTER — Ambulatory Visit: Payer: BC Managed Care – PPO

## 2023-07-12 ENCOUNTER — Other Ambulatory Visit: Payer: Self-pay | Admitting: Internal Medicine

## 2023-07-21 ENCOUNTER — Ambulatory Visit: Payer: BC Managed Care – PPO

## 2023-07-26 ENCOUNTER — Other Ambulatory Visit: Payer: Self-pay | Admitting: Internal Medicine

## 2023-07-28 DIAGNOSIS — I2510 Atherosclerotic heart disease of native coronary artery without angina pectoris: Secondary | ICD-10-CM | POA: Insufficient documentation

## 2023-07-28 NOTE — Progress Notes (Signed)
Subjective:    Patient ID: Tami Hughes, female    DOB: 22-May-1956, 68 y.o.   MRN: [id number]     HPI Tami Hughes is here for follow up of her chronic medical problems.  D-I-L suffers from mental illness - has been having a rough time in the past 90 days.  She has had increased stress trying to support her and the grandkids along with her son.  Not doing good with exercise - not regular.    Diet - not bringing certain foods in.  Trying to eat healthy foods.    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  Has a full-time job and another job.  Plans on quitting 1 job because she has too many obligations and too many things to do   Medications and allergies reviewed with patient and updated if appropriate.  Current Outpatient Medications on File Prior to Visit  Medication Sig Dispense Refill   CARTIA XT 240 MG 24 hr capsule Take 1 capsule by mouth once daily 90 capsule 0   cetirizine (ZYRTEC) 5 MG tablet Take 1 tablet (5 mg total) by mouth daily. 30 tablet 0   lisinopril-hydrochlorothiazide (ZESTORETIC) 20-25 MG tablet Take 1 tablet by mouth once daily 90 tablet 0   metFORMIN (GLUCOPHAGE) 500 MG tablet TAKE 1 TABLET BY MOUTH TWICE DAILY WITH A MEAL 180 tablet 0   MOUNJARO 10 MG/0.5ML Pen INJECT 10 MG INTO THE SKIN ONCE A WEEK 4 mL 0   potassium chloride (KLOR-CON M) 10 MEQ tablet Take 1 tablet by mouth twice daily 180 tablet 0   rosuvastatin (CRESTOR) 5 MG tablet Take 1 tablet by mouth once daily 90 tablet 0   triamcinolone cream (KENALOG) 0.1 % Apply 1 application topically 2 (two) times daily. 30 g 0   No current facility-administered medications on file prior to visit.     Review of Systems  Constitutional:  Negative for fever.  Respiratory:  Negative for cough, shortness of breath and wheezing.   Cardiovascular:  Negative for chest pain, palpitations and leg swelling.   Gastrointestinal:  Negative for constipation and nausea.  Neurological:  Positive for headaches (migraines - stress - excedrin migraine). Negative for light-headedness.       Objective:   Vitals:   07/29/23 1508  BP: 136/70  Pulse: 70  Temp: 98.1 F (36.7 C)  SpO2: 98%   BP Readings from Last 3 Encounters:  07/29/23 136/70  11/18/22 120/60  07/23/22 (!) 149/76   Wt Readings from Last 3 Encounters:  07/29/23 206 lb (93.4 kg)  11/18/22 204 lb (92.5 kg)  07/23/22 201 lb (91.2 kg)   Body mass index is 36.49 kg/m.    Physical Exam Constitutional:      General: She is not in acute distress.    Appearance: Normal appearance.  HENT:     Head: Normocephalic and atraumatic.  Eyes:     Conjunctiva/sclera: Conjunctivae normal.  Cardiovascular:     Rate and Rhythm: Normal rate and regular rhythm.     Heart sounds: Normal heart sounds.  Pulmonary:     Effort: Pulmonary effort is normal. No respiratory distress.     Breath sounds: Normal breath sounds. No wheezing.  Musculoskeletal:     Cervical back: Neck supple.     Right lower leg: No edema.     Left lower leg: No edema.  Lymphadenopathy:     Cervical: No cervical adenopathy.  Skin:    General: Skin is warm and dry.     Findings: No rash.  Neurological:     Mental Status: She is alert. Mental status is at baseline.  Psychiatric:        Mood and Affect: Mood normal.        Behavior: Behavior normal.        Lab Results  Component Value Date   WBC 5.2 11/18/2022   HGB 12.7 11/18/2022   HCT 39.2 11/18/2022   PLT 213.0 11/18/2022   GLUCOSE 92 11/18/2022   CHOL 156 11/18/2022   TRIG 83.0 11/18/2022   HDL 61.50 11/18/2022   LDLDIRECT 179.1 05/02/2012   LDLCALC 78 11/18/2022   ALT 25 11/18/2022   AST 23 11/18/2022   NA 143 11/18/2022   K 4.3 11/18/2022   CL 105 11/18/2022   CREATININE 1.08 11/18/2022   BUN 11 11/18/2022   CO2 31 11/18/2022   TSH 1.38 11/18/2022   HGBA1C 5.5 11/18/2022   MICROALBUR 1.0  11/18/2022     Assessment & Plan:    See Problem List for Assessment and Plan of chronic medical problems.

## 2023-07-29 ENCOUNTER — Ambulatory Visit (INDEPENDENT_AMBULATORY_CARE_PROVIDER_SITE_OTHER): Payer: BC Managed Care – PPO | Admitting: Internal Medicine

## 2023-07-29 ENCOUNTER — Encounter: Payer: Self-pay | Admitting: Internal Medicine

## 2023-07-29 ENCOUNTER — Ambulatory Visit
Admission: RE | Admit: 2023-07-29 | Discharge: 2023-07-29 | Disposition: A | Discharge status: Home / Self Care | Payer: BC Managed Care – PPO | Source: Ambulatory Visit | Attending: Internal Medicine

## 2023-07-29 VITALS — BP 136/70 | HR 70 | Temp 98.1°F | Ht 63.0 in | Wt 206.0 lb

## 2023-07-29 DIAGNOSIS — I2510 Atherosclerotic heart disease of native coronary artery without angina pectoris: Secondary | ICD-10-CM

## 2023-07-29 DIAGNOSIS — E1122 Type 2 diabetes mellitus with diabetic chronic kidney disease: Secondary | ICD-10-CM

## 2023-07-29 DIAGNOSIS — E782 Mixed hyperlipidemia: Secondary | ICD-10-CM | POA: Diagnosis not present

## 2023-07-29 DIAGNOSIS — N1831 Chronic kidney disease, stage 3a: Secondary | ICD-10-CM | POA: Diagnosis not present

## 2023-07-29 DIAGNOSIS — E876 Hypokalemia: Secondary | ICD-10-CM | POA: Diagnosis not present

## 2023-07-29 DIAGNOSIS — I10 Essential (primary) hypertension: Secondary | ICD-10-CM

## 2023-07-29 DIAGNOSIS — Z1231 Encounter for screening mammogram for malignant neoplasm of breast: Secondary | ICD-10-CM | POA: Diagnosis not present

## 2023-07-29 LAB — COMPREHENSIVE METABOLIC PANEL
ALT: 20 U/L (ref 0–35)
AST: 20 U/L (ref 0–37)
Albumin: 4.5 g/dL (ref 3.5–5.2)
Alkaline Phosphatase: 77 U/L (ref 39–117)
BUN: 18 mg/dL (ref 6–23)
CO2: 30 meq/L (ref 19–32)
Calcium: 9.9 mg/dL (ref 8.4–10.5)
Chloride: 102 meq/L (ref 96–112)
Creatinine, Ser: 1.26 mg/dL — ABNORMAL HIGH (ref 0.40–1.20)
GFR: 43.87 mL/min — ABNORMAL LOW (ref >60.00)
Glucose, Bld: 94 mg/dL (ref 70–99)
Potassium: 3.9 meq/L (ref 3.5–5.1)
Sodium: 141 meq/L (ref 135–145)
Total Bilirubin: 0.6 mg/dL (ref 0.2–1.2)
Total Protein: 7.4 g/dL (ref 6.0–8.3)

## 2023-07-29 LAB — CBC WITH DIFFERENTIAL/PLATELET
Basophils Absolute: 0 10*3/uL (ref 0.0–0.1)
Basophils Relative: 0.4 % (ref 0.0–3.0)
Eosinophils Absolute: 0.1 10*3/uL (ref 0.0–0.7)
Eosinophils Relative: 1.6 % (ref 0.0–5.0)
HCT: 40.4 % (ref 36.0–46.0)
Hemoglobin: 13.3 g/dL (ref 12.0–15.0)
Lymphocytes Relative: 47.5 % — ABNORMAL HIGH (ref 12.0–46.0)
Lymphs Abs: 3.2 10*3/uL (ref 0.7–4.0)
MCHC: 32.9 g/dL (ref 30.0–36.0)
MCV: 94.1 fL (ref 78.0–100.0)
Monocytes Absolute: 0.5 10*3/uL (ref 0.1–1.0)
Monocytes Relative: 7.3 % (ref 3.0–12.0)
Neutro Abs: 2.9 10*3/uL (ref 1.4–7.7)
Neutrophils Relative %: 43.2 % (ref 43.0–77.0)
Platelets: 227 10*3/uL (ref 150.0–400.0)
RBC: 4.3 Mil/uL (ref 3.87–5.11)
RDW: 13.3 % (ref 11.5–15.5)
WBC: 6.8 10*3/uL (ref 4.0–10.5)

## 2023-07-29 LAB — LIPID PANEL
Cholesterol: 182 mg/dL (ref 0–200)
HDL: 61.4 mg/dL (ref >39.00)
LDL Cholesterol: 90 mg/dL (ref 0–99)
NonHDL: 120.44
Total CHOL/HDL Ratio: 3
Triglycerides: 153 mg/dL — ABNORMAL HIGH (ref 0.0–149.0)
VLDL: 30.6 mg/dL (ref 0.0–40.0)

## 2023-07-29 LAB — HEMOGLOBIN A1C: Hgb A1c MFr Bld: 6 % (ref 4.6–6.5)

## 2023-07-29 MED ORDER — TIRZEPATIDE 12.5 MG/0.5ML ~~LOC~~ SOAJ: 12.5000 mg | SUBCUTANEOUS | 0 refills | Status: DC

## 2023-07-29 NOTE — Assessment & Plan Note (Signed)
Chronic She is working on [employer] loss Stressed regular exercise-discussed she needs to make that a priority Healthy diet, smaller portions Continue [employer]

## 2023-07-29 NOTE — Patient Instructions (Addendum)
      Blood work was ordered.       Medications changes include :   mounjaro 12.5 mg weekly      Return in about 6 months (around 01/26/2024) for Physical Exam.

## 2023-07-29 NOTE — Assessment & Plan Note (Signed)
Chronic Mild, nonobstructive No symptoms consistent with angina Stressed the importance of getting back to regular exercise Will work more on weight loss Healthy diet, decrease portions Continue Crestor 5 mg daily

## 2023-07-29 NOTE — Assessment & Plan Note (Signed)
Chronic Blood pressure adequately controlled CMP Continue  lisinopril-HCTZ 20-25 mg daily, diltiazem 240 mg daily

## 2023-07-29 NOTE — Assessment & Plan Note (Signed)
Chronic Secondary to hydrochlorothiazide CMP Continue KCl 10 mEq twice daily

## 2023-07-29 NOTE — Assessment & Plan Note (Signed)
Chronic Lab Results  Component Value Date   HGBA1C 6.0 07/29/2023   Check A1c Sugars well controlled Continue metformin 500 mg twice daily Increase Mounjaro to 12.5 mg weekly Continue to work on improving diet, exercising regularly and weight loss

## 2023-07-29 NOTE — Assessment & Plan Note (Signed)
Chronic Regular exercise and healthy diet encouraged Check lipid panel  Continue Crestor 5 mg daily 

## 2023-07-29 NOTE — Assessment & Plan Note (Signed)
Chronic Has had several recent blood work with decreased GFR-mild Discussed with her Stressed good blood pressure, sugar control Avoid NSAIDs Increase water intake Work on weight loss Low-sodium diet CBC, CMP

## 2023-07-31 ENCOUNTER — Encounter: Payer: Self-pay | Admitting: Internal Medicine

## 2023-07-31 MED ORDER — DILTIAZEM HCL ER COATED BEADS 300 MG PO CP24: 300.0000 mg | ORAL_CAPSULE | Freq: Every day | ORAL | 1 refills | Status: DC

## 2023-07-31 NOTE — Addendum Note (Signed)
Addended by: Pincus Sanes on: 07/31/2023 11:57 AM   Modules accepted: Orders

## 2023-08-01 ENCOUNTER — Encounter: Payer: Self-pay | Admitting: Internal Medicine

## 2023-08-16 DIAGNOSIS — H20012 Primary iridocyclitis, left eye: Secondary | ICD-10-CM | POA: Diagnosis not present

## 2023-08-25 ENCOUNTER — Other Ambulatory Visit: Payer: Self-pay | Admitting: Internal Medicine

## 2023-08-25 ENCOUNTER — Encounter: Payer: Self-pay | Admitting: Internal Medicine

## 2023-09-01 DIAGNOSIS — H20012 Primary iridocyclitis, left eye: Secondary | ICD-10-CM | POA: Diagnosis not present

## 2023-09-18 ENCOUNTER — Other Ambulatory Visit: Payer: Self-pay | Admitting: Internal Medicine

## 2023-09-19 DIAGNOSIS — M25562 Pain in left knee: Secondary | ICD-10-CM | POA: Diagnosis not present

## 2023-10-04 ENCOUNTER — Other Ambulatory Visit: Payer: Self-pay | Admitting: Urology

## 2023-10-04 DIAGNOSIS — N281 Cyst of kidney, acquired: Secondary | ICD-10-CM

## 2023-10-13 ENCOUNTER — Other Ambulatory Visit: Payer: Self-pay | Admitting: Internal Medicine

## 2023-12-11 ENCOUNTER — Other Ambulatory Visit: Payer: Self-pay | Admitting: Internal Medicine

## 2024-01-12 ENCOUNTER — Other Ambulatory Visit: Payer: Self-pay | Admitting: Internal Medicine

## 2024-01-17 ENCOUNTER — Other Ambulatory Visit

## 2024-02-07 ENCOUNTER — Other Ambulatory Visit: Payer: Self-pay | Admitting: Internal Medicine

## 2024-02-13 ENCOUNTER — Other Ambulatory Visit: Payer: BC Managed Care – PPO

## 2024-02-14 ENCOUNTER — Ambulatory Visit
Admission: RE | Admit: 2024-02-14 | Discharge: 2024-02-14 | Disposition: A | Discharge status: Home / Self Care | Source: Ambulatory Visit | Attending: Urology | Admitting: Urology

## 2024-02-14 DIAGNOSIS — N281 Cyst of kidney, acquired: Secondary | ICD-10-CM

## 2024-02-14 MED ORDER — GADOPICLENOL 0.5 MMOL/ML IV SOLN
10.0000 mL | Freq: Once | INTRAVENOUS | Status: AC | PRN
  Administered 2024-02-14: 10 mL via INTRAVENOUS

## 2024-03-08 ENCOUNTER — Other Ambulatory Visit: Payer: Self-pay | Admitting: Internal Medicine

## 2024-03-12 NOTE — Progress Notes (Unsigned)
 Subjective:    Patient ID: Tami Hughes, female    DOB: 1955/12/24, 68 y.o.   MRN: [id number]     HPI Tami Hughes is here for follow up of her chronic medical problems.  Left shoulder pain.  Using voltaren  gel - helps a little, but can not sleep on the left side.  Has also been using lidocaine  patches at night.  Increased pain with movement.  Some days has dec ROM.   Using exercise bands.   Medications and allergies reviewed with patient and updated if appropriate.  Current Outpatient Medications on File Prior to Visit  Medication Sig Dispense Refill   CARTIA  XT 300 MG 24 hr capsule Take 1 capsule by mouth once daily 90 capsule 0   cetirizine  (ZYRTEC ) 5 MG tablet Take 1 tablet (5 mg total) by mouth daily. 30 tablet 0   lisinopril -hydrochlorothiazide  (ZESTORETIC ) 20-25 MG tablet Take 1 tablet by mouth once daily 90 tablet 0   metFORMIN  (GLUCOPHAGE ) 500 MG tablet TAKE 1 TABLET BY MOUTH TWICE DAILY WITH A MEAL 180 tablet 0   MOUNJARO  12.5 MG/0.5ML Pen INJECT 1 SYRINGE SUBCUTANEOUSLY ONCE A WEEK 4 mL 0   potassium chloride  (KLOR-CON  M) 10 MEQ tablet Take 1 tablet by mouth twice daily 180 tablet 0   rosuvastatin  (CRESTOR ) 5 MG tablet Take 1 tablet by mouth once daily 90 tablet 0   triamcinolone  cream (KENALOG ) 0.1 % Apply 1 application topically 2 (two) times daily. 30 g 0   No current facility-administered medications on file prior to visit.     Review of Systems  Constitutional:  Negative for fever.  Respiratory:  Negative for cough, shortness of breath and wheezing.   Cardiovascular:  Negative for chest pain, palpitations and leg swelling.       Objective:   Vitals:   03/13/24 0801  BP: 120/78  Pulse: 67  Temp: 98 F (36.7 C)  SpO2: 97%   BP Readings from Last 3 Encounters:  03/13/24 120/78  07/29/23 136/70  11/18/22 120/60   Wt Readings from Last 3 Encounters:  03/13/24 207 lb (93.9 kg)  07/29/23 206 lb (93.4 kg)  11/18/22 204 lb (92.5 kg)   Body mass  index is 36.67 kg/m.    Physical Exam Constitutional:      General: She is not in acute distress.    Appearance: Normal appearance.  HENT:     Head: Normocephalic and atraumatic.  Eyes:     Conjunctiva/sclera: Conjunctivae normal.  Cardiovascular:     Rate and Rhythm: Normal rate and regular rhythm.     Heart sounds: Normal heart sounds.  Pulmonary:     Effort: Pulmonary effort is normal. No respiratory distress.     Breath sounds: Normal breath sounds. No wheezing.  Musculoskeletal:     Cervical back: Neck supple.     Right lower leg: No edema.     Left lower leg: No edema.  Lymphadenopathy:     Cervical: No cervical adenopathy.  Skin:    General: Skin is warm and dry.     Findings: No rash.  Neurological:     Mental Status: She is alert. Mental status is at baseline.  Psychiatric:        Mood and Affect: Mood normal.        Behavior: Behavior normal.        Lab Results  Component Value Date   WBC 6.8 07/29/2023   HGB 13.3 07/29/2023   HCT 40.4 07/29/2023  PLT 227.0 07/29/2023   GLUCOSE 94 07/29/2023   CHOL 182 07/29/2023   TRIG 153.0 (H) 07/29/2023   HDL 61.40 07/29/2023   LDLDIRECT 179.1 05/02/2012   LDLCALC 90 07/29/2023   ALT 20 07/29/2023   AST 20 07/29/2023   NA 141 07/29/2023   K 3.9 07/29/2023   CL 102 07/29/2023   CREATININE 1.26 (H) 07/29/2023   BUN 18 07/29/2023   CO2 30 07/29/2023   TSH 1.38 11/18/2022   HGBA1C 6.0 07/29/2023     Assessment & Plan:    See Problem List for Assessment and Plan of chronic medical problems.

## 2024-03-12 NOTE — Patient Instructions (Addendum)
      Blood work was ordered.       Medications changes include :   None    A referral was ordered and someone will call you to schedule an appointment.     Return in about 6 months (around 09/10/2024) for follow up.

## 2024-03-13 ENCOUNTER — Ambulatory Visit (INDEPENDENT_AMBULATORY_CARE_PROVIDER_SITE_OTHER): Admitting: Internal Medicine

## 2024-03-13 VITALS — BP 120/78 | HR 67 | Temp 98.0°F | Ht 63.0 in | Wt 207.0 lb

## 2024-03-13 DIAGNOSIS — I152 Hypertension secondary to endocrine disorders: Secondary | ICD-10-CM | POA: Diagnosis not present

## 2024-03-13 DIAGNOSIS — E876 Hypokalemia: Secondary | ICD-10-CM

## 2024-03-13 DIAGNOSIS — E1159 Type 2 diabetes mellitus with other circulatory complications: Secondary | ICD-10-CM | POA: Diagnosis not present

## 2024-03-13 DIAGNOSIS — N281 Cyst of kidney, acquired: Secondary | ICD-10-CM

## 2024-03-13 DIAGNOSIS — E1122 Type 2 diabetes mellitus with diabetic chronic kidney disease: Secondary | ICD-10-CM

## 2024-03-13 DIAGNOSIS — N1831 Chronic kidney disease, stage 3a: Secondary | ICD-10-CM

## 2024-03-13 DIAGNOSIS — E785 Hyperlipidemia, unspecified: Secondary | ICD-10-CM | POA: Diagnosis not present

## 2024-03-13 DIAGNOSIS — E1169 Type 2 diabetes mellitus with other specified complication: Secondary | ICD-10-CM

## 2024-03-13 DIAGNOSIS — I2510 Atherosclerotic heart disease of native coronary artery without angina pectoris: Secondary | ICD-10-CM

## 2024-03-13 DIAGNOSIS — Z6836 Body mass index (BMI) 36.0-36.9, adult: Secondary | ICD-10-CM

## 2024-03-13 DIAGNOSIS — M25512 Pain in left shoulder: Secondary | ICD-10-CM

## 2024-03-13 LAB — COMPREHENSIVE METABOLIC PANEL WITH GFR
ALT: 26 U/L (ref 0–35)
AST: 24 U/L (ref 0–37)
Albumin: 4.4 g/dL (ref 3.5–5.2)
Alkaline Phosphatase: 63 U/L (ref 39–117)
BUN: 11 mg/dL (ref 6–23)
CO2: 28 meq/L (ref 19–32)
Calcium: 9.8 mg/dL (ref 8.4–10.5)
Chloride: 103 meq/L (ref 96–112)
Creatinine, Ser: 1.03 mg/dL (ref 0.40–1.20)
GFR: 55.62 mL/min — ABNORMAL LOW (ref >60.00)
Glucose, Bld: 101 mg/dL — ABNORMAL HIGH (ref 70–99)
Potassium: 3.5 meq/L (ref 3.5–5.1)
Sodium: 142 meq/L (ref 135–145)
Total Bilirubin: 0.8 mg/dL (ref 0.2–1.2)
Total Protein: 7.1 g/dL (ref 6.0–8.3)

## 2024-03-13 LAB — CBC WITH DIFFERENTIAL/PLATELET
Basophils Absolute: 0 K/uL (ref 0.0–0.1)
Basophils Relative: 0.4 % (ref 0.0–3.0)
Eosinophils Absolute: 0.1 K/uL (ref 0.0–0.7)
Eosinophils Relative: 1.9 % (ref 0.0–5.0)
HCT: 37.6 % (ref 36.0–46.0)
Hemoglobin: 12.6 g/dL (ref 12.0–15.0)
Lymphocytes Relative: 44.2 % (ref 12.0–46.0)
Lymphs Abs: 2.5 K/uL (ref 0.7–4.0)
MCHC: 33.4 g/dL (ref 30.0–36.0)
MCV: 92.3 fl (ref 78.0–100.0)
Monocytes Absolute: 0.4 K/uL (ref 0.1–1.0)
Monocytes Relative: 7.6 % (ref 3.0–12.0)
Neutro Abs: 2.6 K/uL (ref 1.4–7.7)
Neutrophils Relative %: 45.9 % (ref 43.0–77.0)
Platelets: 193 K/uL (ref 150.0–400.0)
RBC: 4.08 Mil/uL (ref 3.87–5.11)
RDW: 14.1 % (ref 11.5–15.5)
WBC: 5.7 K/uL (ref 4.0–10.5)

## 2024-03-13 LAB — LIPID PANEL
Cholesterol: 156 mg/dL (ref 0–200)
HDL: 60.5 mg/dL (ref >39.00)
LDL Cholesterol: 73 mg/dL (ref 0–99)
NonHDL: 95.95
Total CHOL/HDL Ratio: 3
Triglycerides: 116 mg/dL (ref 0.0–149.0)
VLDL: 23.2 mg/dL (ref 0.0–40.0)

## 2024-03-13 LAB — VITAMIN D 25 HYDROXY (VIT D DEFICIENCY, FRACTURES): VITD: 9.92 ng/mL — ABNORMAL LOW (ref 30.00–100.00)

## 2024-03-13 LAB — MICROALBUMIN / CREATININE URINE RATIO
Creatinine,U: 92.7 mg/dL
Microalb Creat Ratio: UNDETERMINED mg/g (ref 0.0–30.0)
Microalb, Ur: <0.7 mg/dL

## 2024-03-13 LAB — HEMOGLOBIN A1C: Hgb A1c MFr Bld: 6.1 % (ref 4.6–6.5)

## 2024-03-13 NOTE — Assessment & Plan Note (Signed)
 Chronic Secondary to hydrochlorothiazide CMP Continue KCl 10 mEq twice daily

## 2024-03-13 NOTE — Assessment & Plan Note (Signed)
 Chronic Mild Stressed good blood pressure, sugar control, weight loss Avoid NSAIDs Increase water intake Work on weight loss Low-sodium diet CBC, CMP

## 2024-03-13 NOTE — Assessment & Plan Note (Signed)
 Chronic Mild, nonobstructive No symptoms consistent with angina Stressed the importance of getting back to regular exercise Will work more on weight loss Healthy diet, decrease portions Continue Crestor 5 mg daily

## 2024-03-13 NOTE — Assessment & Plan Note (Addendum)
 Chronic BMI 36.67 She is working on weight loss Stressed regular exercise Healthy diet, smaller portions - increase protein and veges Continue Mounjaro , metformin 

## 2024-03-13 NOTE — Assessment & Plan Note (Addendum)
 Chronic Associated with CKD 3a  Lab Results  Component Value Date   HGBA1C 6.0 07/29/2023   Check A1c, urine albumin/creatinine ratio Sugars well controlled Continue metformin  500 mg twice daily Continue Mounjaro  to 12.5 mg weekly Continue to work on improving diet, exercising regularly and weight loss

## 2024-03-13 NOTE — Assessment & Plan Note (Signed)
 Monitored by urology Right kidney 4.5 cm cyst, other cyst-all likely benign Had follow up imaging recently - stable

## 2024-03-13 NOTE — Assessment & Plan Note (Signed)
 Acute No obvious injury Mild pain - worse w/ movement and sleeping on that side Continue voltaren  gel, revision of activities/rest Can ice / heat If no improvement would advise seeing sports med

## 2024-03-13 NOTE — Assessment & Plan Note (Signed)
 Chronic Blood pressure adequately controlled CMP, CBC Continue  lisinopril -HCTZ 20-25 mg daily, diltiazem  300 mg daily Monitor BP at home

## 2024-03-13 NOTE — Assessment & Plan Note (Signed)
Chronic Regular exercise and healthy diet encouraged Check lipid panel  Continue Crestor 5 mg daily 

## 2024-03-15 ENCOUNTER — Ambulatory Visit: Payer: Self-pay | Admitting: Internal Medicine

## 2024-03-15 MED ORDER — VITAMIN D (ERGOCALCIFEROL) 1.25 MG (50000 UNIT) PO CAPS
50000.0000 [IU] | ORAL_CAPSULE | ORAL | 0 refills | Status: AC
Start: 2024-03-15 — End: 2024-05-14

## 2024-04-13 ENCOUNTER — Other Ambulatory Visit: Payer: Self-pay | Admitting: Internal Medicine

## 2024-04-24 ENCOUNTER — Ambulatory Visit: Payer: Self-pay | Admitting: Internal Medicine

## 2024-04-24 ENCOUNTER — Ambulatory Visit (HOSPITAL_BASED_OUTPATIENT_CLINIC_OR_DEPARTMENT_OTHER)
Admission: RE | Admit: 2024-04-24 | Discharge: 2024-04-24 | Disposition: A | Discharge status: Home / Self Care | Source: Ambulatory Visit | Attending: Internal Medicine | Admitting: Internal Medicine

## 2024-04-24 DIAGNOSIS — E2839 Other primary ovarian failure: Secondary | ICD-10-CM | POA: Diagnosis present

## 2024-06-03 ENCOUNTER — Other Ambulatory Visit: Payer: Self-pay | Admitting: Internal Medicine

## 2024-07-09 ENCOUNTER — Other Ambulatory Visit: Payer: Self-pay | Admitting: Internal Medicine

## 2024-07-09 DIAGNOSIS — Z1231 Encounter for screening mammogram for malignant neoplasm of breast: Secondary | ICD-10-CM

## 2024-07-10 ENCOUNTER — Other Ambulatory Visit: Payer: Self-pay | Admitting: Internal Medicine

## 2024-07-23 ENCOUNTER — Encounter: Payer: Self-pay | Admitting: Internal Medicine

## 2024-07-28 MED ORDER — VITAMIN D (ERGOCALCIFEROL) 1.25 MG (50000 UNIT) PO CAPS: 50000.0000 [IU] | ORAL_CAPSULE | ORAL | 0 refills | Status: AC

## 2024-07-30 ENCOUNTER — Encounter: Payer: Self-pay | Admitting: Internal Medicine

## 2024-07-30 DIAGNOSIS — Z1231 Encounter for screening mammogram for malignant neoplasm of breast: Secondary | ICD-10-CM

## 2024-08-03 ENCOUNTER — Ambulatory Visit: Payer: PRIVATE HEALTH INSURANCE | Admitting: Internal Medicine

## 2024-08-03 ENCOUNTER — Encounter: Payer: Self-pay | Admitting: Internal Medicine

## 2024-08-03 VITALS — BP 132/74 | HR 80 | Temp 98.3°F | Ht 63.0 in | Wt 220.0 lb

## 2024-08-03 DIAGNOSIS — R5383 Other fatigue: Secondary | ICD-10-CM

## 2024-08-03 DIAGNOSIS — E1122 Type 2 diabetes mellitus with diabetic chronic kidney disease: Secondary | ICD-10-CM

## 2024-08-03 DIAGNOSIS — N1831 Chronic kidney disease, stage 3a: Secondary | ICD-10-CM

## 2024-08-03 LAB — COMPREHENSIVE METABOLIC PANEL WITH GFR
ALT: 34 U/L (ref 3–35)
AST: 26 U/L (ref 5–37)
Albumin: 4.5 g/dL (ref 3.5–5.2)
Alkaline Phosphatase: 81 U/L (ref 39–117)
BUN: 13 mg/dL (ref 6–23)
CO2: 31 meq/L (ref 19–32)
Calcium: 10 mg/dL (ref 8.4–10.5)
Chloride: 103 meq/L (ref 96–112)
Creatinine, Ser: 1.11 mg/dL (ref 0.40–1.20)
GFR: 50.71 mL/min — ABNORMAL LOW
Glucose, Bld: 95 mg/dL (ref 70–99)
Potassium: 4.1 meq/L (ref 3.5–5.1)
Sodium: 142 meq/L (ref 135–145)
Total Bilirubin: 0.6 mg/dL (ref 0.2–1.2)
Total Protein: 7.5 g/dL (ref 6.0–8.3)

## 2024-08-03 LAB — VITAMIN B12: Vitamin B-12: 488 pg/mL (ref 211–911)

## 2024-08-03 LAB — CBC WITH DIFFERENTIAL/PLATELET
Basophils Absolute: 0 10*3/uL (ref 0.0–0.1)
Basophils Relative: 0.6 % (ref 0.0–3.0)
Eosinophils Absolute: 0.1 10*3/uL (ref 0.0–0.7)
Eosinophils Relative: 1.4 % (ref 0.0–5.0)
HCT: 39.7 % (ref 36.0–46.0)
Hemoglobin: 13.1 g/dL (ref 12.0–15.0)
Lymphocytes Relative: 50.5 % — ABNORMAL HIGH (ref 12.0–46.0)
Lymphs Abs: 3.1 10*3/uL (ref 0.7–4.0)
MCHC: 33 g/dL (ref 30.0–36.0)
MCV: 90.9 fl (ref 78.0–100.0)
Monocytes Absolute: 0.5 10*3/uL (ref 0.1–1.0)
Monocytes Relative: 8.1 % (ref 3.0–12.0)
Neutro Abs: 2.4 10*3/uL (ref 1.4–7.7)
Neutrophils Relative %: 39.4 % — ABNORMAL LOW (ref 43.0–77.0)
Platelets: 200 10*3/uL (ref 150.0–400.0)
RBC: 4.37 Mil/uL (ref 3.87–5.11)
RDW: 14.2 % (ref 11.5–15.5)
WBC: 6.1 10*3/uL (ref 4.0–10.5)

## 2024-08-03 LAB — HEMOGLOBIN A1C: Hgb A1c MFr Bld: 6.3 % (ref 4.6–6.5)

## 2024-08-03 LAB — VITAMIN D 25 HYDROXY (VIT D DEFICIENCY, FRACTURES): VITD: 30.92 ng/mL (ref 30.00–100.00)

## 2024-08-03 LAB — TSH: TSH: 1.34 u[IU]/mL (ref 0.35–5.50)

## 2024-08-03 MED ORDER — SEMAGLUTIDE (1 MG/DOSE) 4 MG/3ML ~~LOC~~ SOPN: 1.0000 mg | PEN_INJECTOR | SUBCUTANEOUS | 1 refills | Status: AC

## 2024-08-03 NOTE — Progress Notes (Unsigned)
" ° ° °  Subjective:    Patient ID: Tami Hughes, female    DOB: 06-Sep-1955, 69 y.o.   MRN: [id number]      HPI Tami Hughes is here for  Chief Complaint  Patient presents with   Insomnia    Request to have labs checked      Not good qual sleep.   11-7 -    Medications and allergies reviewed with patient and updated if appropriate.  Medications Ordered Prior to Encounter[1]  Review of Systems  Constitutional:  Positive for fatigue.  Respiratory:  Negative for shortness of breath.   Cardiovascular:  Negative for chest pain and palpitations.       Objective:   Vitals:   08/03/24 1511  BP: 132/74  Pulse: 80  Temp: 98.3 F (36.8 C)  SpO2: 97%   BP Readings from Last 3 Encounters:  08/03/24 132/74  03/13/24 120/78  07/29/23 136/70   Wt Readings from Last 3 Encounters:  08/03/24 220 lb (99.8 kg)  03/13/24 207 lb (93.9 kg)  07/29/23 206 lb (93.4 kg)   Body mass index is 38.97 kg/m.    Physical Exam         Assessment & Plan:    See Problem List for Assessment and Plan of chronic medical problems.         [1]  Current Outpatient Medications on File Prior to Visit  Medication Sig Dispense Refill   CARTIA  XT 300 MG 24 hr capsule Take 1 capsule by mouth once daily 90 capsule 0   lisinopril -hydrochlorothiazide  (ZESTORETIC ) 20-25 MG tablet Take 1 tablet by mouth once daily 90 tablet 0   MAGNESIUM GLUCONATE PO Take 400 mg by mouth at bedtime.     metFORMIN  (GLUCOPHAGE ) 500 MG tablet TAKE 1 TABLET BY MOUTH TWICE DAILY WITH A MEAL 180 tablet 0   MOUNJARO  12.5 MG/0.5ML Pen INJECT 1 SYRINGE SUBCUTANEOUSLY ONCE A WEEK 4 mL 0   rosuvastatin  (CRESTOR ) 5 MG tablet Take 1 tablet by mouth once daily 90 tablet 0   Vitamin D , Ergocalciferol , (DRISDOL ) 1.25 MG (50000 UNIT) CAPS capsule Take 1 capsule (50,000 Units total) by mouth every 7 (seven) days for 28 days. 4 capsule 0   No current facility-administered medications on file prior to visit.   "

## 2024-08-03 NOTE — Patient Instructions (Addendum)
" ° ° ° ° °  Blood work was ordered.        Medications changes include :   ozempic  1 mg        Return in about 4 months (around 12/01/2024) for follow up.  "

## 2024-08-08 ENCOUNTER — Ambulatory Visit: Payer: PRIVATE HEALTH INSURANCE
